# Patient Record
Sex: Male | Born: 1940
Health system: Southern US, Community
[De-identification: ages and names within clinical notes are randomized; demographics above are authoritative.]

## PROBLEM LIST (undated history)

## (undated) DIAGNOSIS — K219 Gastro-esophageal reflux disease without esophagitis: Secondary | ICD-10-CM

## (undated) DIAGNOSIS — J45909 Unspecified asthma, uncomplicated: Secondary | ICD-10-CM

## (undated) DIAGNOSIS — N400 Enlarged prostate without lower urinary tract symptoms: Secondary | ICD-10-CM

## (undated) DIAGNOSIS — R7303 Prediabetes: Secondary | ICD-10-CM

## (undated) DIAGNOSIS — I10 Essential (primary) hypertension: Secondary | ICD-10-CM

## (undated) DIAGNOSIS — E785 Hyperlipidemia, unspecified: Secondary | ICD-10-CM

## (undated) DIAGNOSIS — T7840XA Allergy, unspecified, initial encounter: Secondary | ICD-10-CM

## (undated) DIAGNOSIS — Z85038 Personal history of other malignant neoplasm of large intestine: Secondary | ICD-10-CM

## (undated) DIAGNOSIS — E559 Vitamin D deficiency, unspecified: Secondary | ICD-10-CM

## (undated) HISTORY — DX: Prediabetes: R73.03

## (undated) HISTORY — DX: Allergy, unspecified, initial encounter: T78.40XA

## (undated) HISTORY — DX: Gastro-esophageal reflux disease without esophagitis: K21.9

## (undated) HISTORY — DX: Unspecified asthma, uncomplicated: J45.909

## (undated) HISTORY — DX: Personal history of other malignant neoplasm of large intestine: Z85.038

## (undated) HISTORY — DX: Vitamin D deficiency, unspecified: E55.9

## (undated) HISTORY — DX: Hyperlipidemia, unspecified: E78.5

## (undated) HISTORY — DX: Essential (primary) hypertension: I10

## (undated) HISTORY — DX: Benign prostatic hyperplasia without lower urinary tract symptoms: N40.0

---

## 2000-01-17 ENCOUNTER — Encounter: Payer: Self-pay | Admitting: Internal Medicine

## 2000-01-17 ENCOUNTER — Ambulatory Visit (HOSPITAL_COMMUNITY): Admission: RE | Admit: 2000-01-17 | Discharge: 2000-01-17 | Payer: Self-pay | Admitting: Internal Medicine

## 2000-02-10 ENCOUNTER — Encounter: Payer: Self-pay | Admitting: Internal Medicine

## 2000-02-10 ENCOUNTER — Ambulatory Visit (HOSPITAL_COMMUNITY): Admission: RE | Admit: 2000-02-10 | Discharge: 2000-02-10 | Payer: Self-pay | Admitting: Internal Medicine

## 2001-06-21 ENCOUNTER — Ambulatory Visit (HOSPITAL_COMMUNITY): Admission: RE | Admit: 2001-06-21 | Discharge: 2001-06-21 | Payer: Self-pay | Admitting: Internal Medicine

## 2001-06-21 ENCOUNTER — Encounter: Payer: Self-pay | Admitting: Internal Medicine

## 2002-11-03 HISTORY — PX: COLON SURGERY: SHX602

## 2003-03-13 ENCOUNTER — Encounter: Payer: Self-pay | Admitting: General Surgery

## 2003-03-13 ENCOUNTER — Ambulatory Visit (HOSPITAL_COMMUNITY): Admission: RE | Admit: 2003-03-13 | Discharge: 2003-03-13 | Payer: Self-pay | Admitting: General Surgery

## 2003-03-16 ENCOUNTER — Ambulatory Visit (HOSPITAL_COMMUNITY): Admission: RE | Admit: 2003-03-16 | Discharge: 2003-03-16 | Payer: Self-pay | Admitting: General Surgery

## 2003-03-24 ENCOUNTER — Encounter: Payer: Self-pay | Admitting: General Surgery

## 2003-04-04 ENCOUNTER — Encounter (INDEPENDENT_AMBULATORY_CARE_PROVIDER_SITE_OTHER): Payer: Self-pay

## 2003-04-04 ENCOUNTER — Inpatient Hospital Stay (HOSPITAL_COMMUNITY): Admission: RE | Admit: 2003-04-04 | Discharge: 2003-04-11 | Payer: Self-pay | Admitting: General Surgery

## 2003-05-01 ENCOUNTER — Ambulatory Visit: Admission: RE | Admit: 2003-05-01 | Discharge: 2003-05-17 | Payer: Self-pay | Admitting: Radiation Oncology

## 2003-05-11 ENCOUNTER — Encounter: Payer: Self-pay | Admitting: General Surgery

## 2003-05-11 ENCOUNTER — Ambulatory Visit (HOSPITAL_COMMUNITY): Admission: RE | Admit: 2003-05-11 | Discharge: 2003-05-11 | Payer: Self-pay | Admitting: General Surgery

## 2003-07-04 ENCOUNTER — Ambulatory Visit: Admission: RE | Admit: 2003-07-04 | Discharge: 2003-09-21 | Payer: Self-pay | Admitting: Radiation Oncology

## 2003-10-19 ENCOUNTER — Ambulatory Visit: Admission: RE | Admit: 2003-10-19 | Discharge: 2003-10-19 | Payer: Self-pay | Admitting: Radiation Oncology

## 2003-12-05 ENCOUNTER — Ambulatory Visit (HOSPITAL_COMMUNITY): Admission: RE | Admit: 2003-12-05 | Discharge: 2003-12-05 | Payer: Self-pay | Admitting: General Surgery

## 2003-12-18 ENCOUNTER — Ambulatory Visit (HOSPITAL_COMMUNITY): Admission: RE | Admit: 2003-12-18 | Discharge: 2003-12-18 | Payer: Self-pay | Admitting: General Surgery

## 2004-06-04 ENCOUNTER — Ambulatory Visit (HOSPITAL_COMMUNITY): Admission: RE | Admit: 2004-06-04 | Discharge: 2004-06-04 | Payer: Self-pay | Admitting: Oncology

## 2004-12-09 ENCOUNTER — Ambulatory Visit (HOSPITAL_COMMUNITY): Admission: RE | Admit: 2004-12-09 | Discharge: 2004-12-09 | Payer: Self-pay | Admitting: Oncology

## 2005-03-28 ENCOUNTER — Ambulatory Visit: Payer: Self-pay | Admitting: Oncology

## 2005-06-09 ENCOUNTER — Ambulatory Visit (HOSPITAL_COMMUNITY): Admission: RE | Admit: 2005-06-09 | Discharge: 2005-06-09 | Payer: Self-pay | Admitting: Oncology

## 2005-09-23 ENCOUNTER — Ambulatory Visit: Payer: Self-pay | Admitting: Oncology

## 2006-04-16 ENCOUNTER — Ambulatory Visit: Payer: Self-pay | Admitting: Oncology

## 2006-04-20 LAB — COMPREHENSIVE METABOLIC PANEL
ALT: 44 U/L — ABNORMAL HIGH (ref 0–40)
Alkaline Phosphatase: 106 U/L (ref 39–117)
Creatinine, Ser: 1.27 mg/dL (ref 0.40–1.50)
Sodium: 142 mEq/L (ref 135–145)
Total Bilirubin: 0.5 mg/dL (ref 0.3–1.2)
Total Protein: 7.2 g/dL (ref 6.0–8.3)

## 2006-04-23 ENCOUNTER — Ambulatory Visit (HOSPITAL_COMMUNITY): Admission: RE | Admit: 2006-04-23 | Discharge: 2006-04-23 | Payer: Self-pay | Admitting: Oncology

## 2006-08-24 ENCOUNTER — Ambulatory Visit (HOSPITAL_COMMUNITY): Admission: RE | Admit: 2006-08-24 | Discharge: 2006-08-24 | Payer: Self-pay | Admitting: Internal Medicine

## 2006-10-15 ENCOUNTER — Ambulatory Visit: Payer: Self-pay | Admitting: Oncology

## 2006-10-19 LAB — COMPREHENSIVE METABOLIC PANEL
ALT: 18 U/L (ref 0–53)
Albumin: 4.4 g/dL (ref 3.5–5.2)
CO2: 28 mEq/L (ref 19–32)
Chloride: 107 mEq/L (ref 96–112)
Glucose, Bld: 88 mg/dL (ref 70–99)
Potassium: 4 mEq/L (ref 3.5–5.3)
Sodium: 143 mEq/L (ref 135–145)
Total Protein: 7.3 g/dL (ref 6.0–8.3)

## 2006-10-19 LAB — CEA: CEA: 1.8 ng/mL (ref 0.0–5.0)

## 2006-10-22 ENCOUNTER — Ambulatory Visit (HOSPITAL_COMMUNITY): Admission: RE | Admit: 2006-10-22 | Discharge: 2006-10-22 | Payer: Self-pay | Admitting: Oncology

## 2007-03-18 IMAGING — CT CT CHEST W/ CM
1 of 3 series · 13 of 32 positions shown, 18 images · IV contrast (omnipaque)
Comparison: Examination of 06/09/05.

CLINICAL DATA: Rectal carcinoma.  Followup status post chemotherapy and radiation therapy completion.  No complaints. 
CHEST CT WITH CONTRAST:
TECHNIQUE: Multidetector CT imaging of the chest was performed following the standard protocol during bolus administration of intravenous contrast.
Contrast:  125 cc Omnipaque 300.
TECHNIQUE: Multidetector CT imaging of the abdomen was performed following the standard protocol during bolus administration of intravenous contrast.
TECHNIQUE: Multidetector CT imaging of the pelvis was performed following the standard protocol during bolus administration of intravenous contrast.

[Series 2: cap 5.0 b30f · axial · 0.68mm/px · z∈[-623,-63]mm · 13 of 128 slices shown, 18 images]
[im 8/128  soft-tissue]
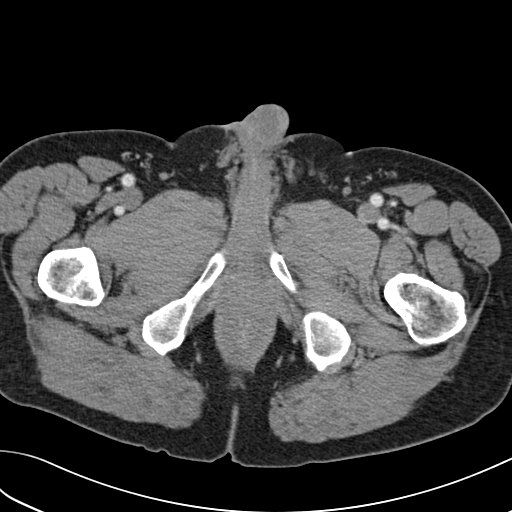
[im 8/128  bone]
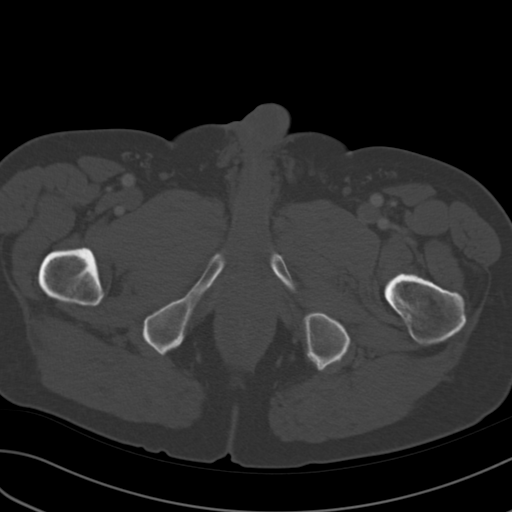
[im 22/128  soft-tissue]
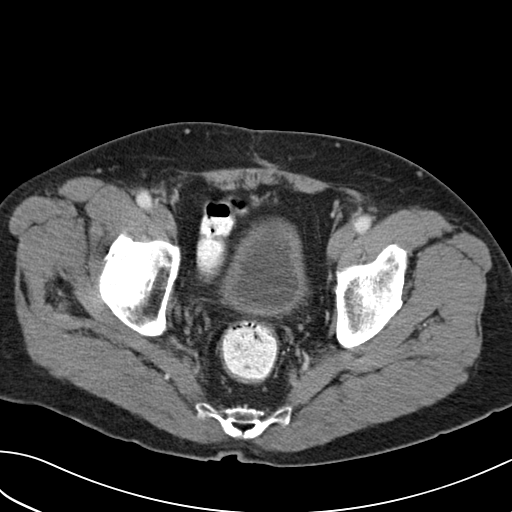
[im 29/128  soft-tissue]
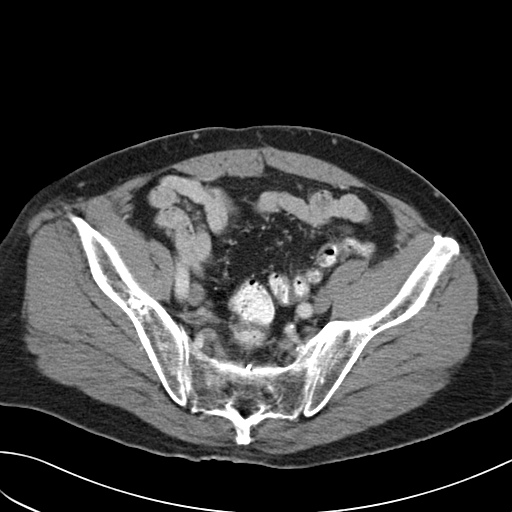
[im 36/128  soft-tissue]
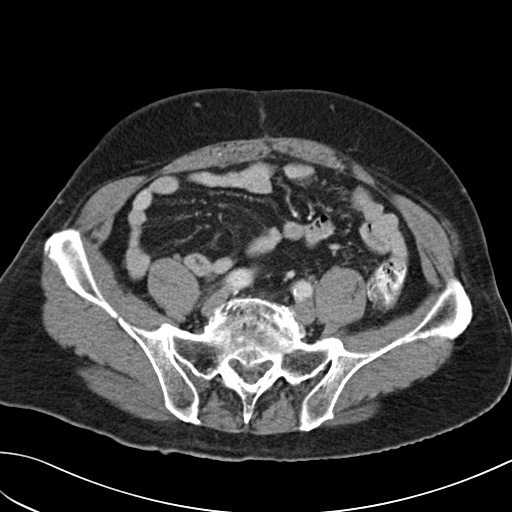
[im 50/128  soft-tissue]
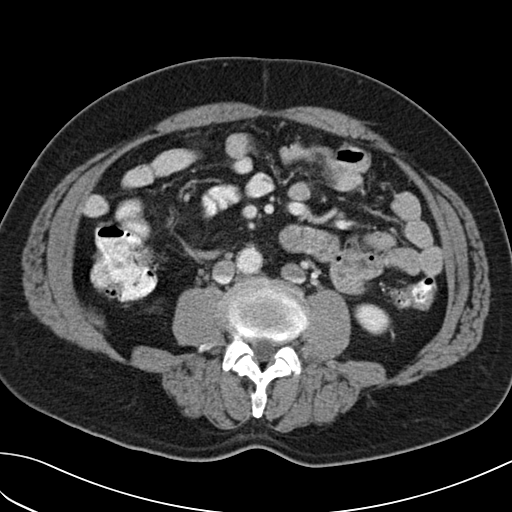
[im 57/128  soft-tissue]
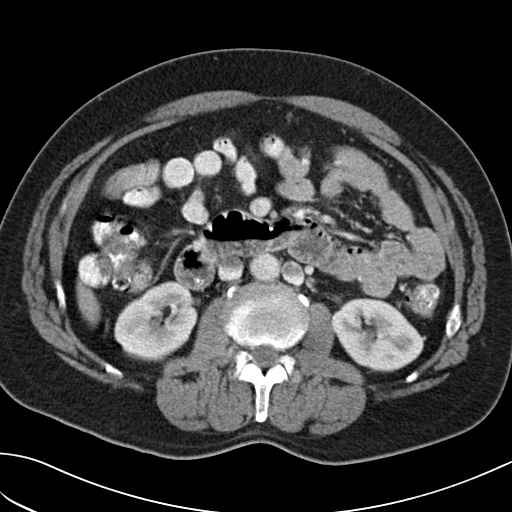
[im 71/128  soft-tissue]
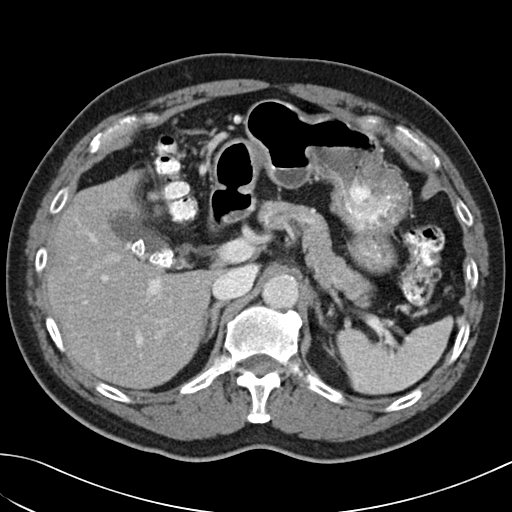
[im 78/128  soft-tissue]
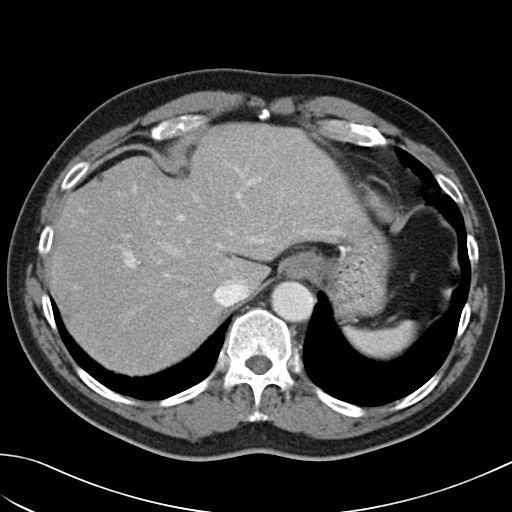
[im 92/128  soft-tissue]
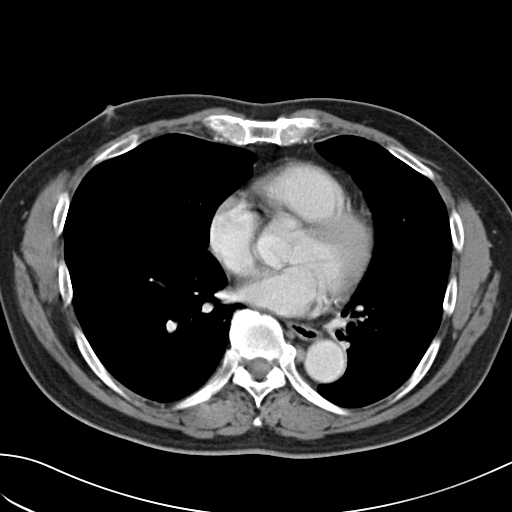
[im 92/128  bone]
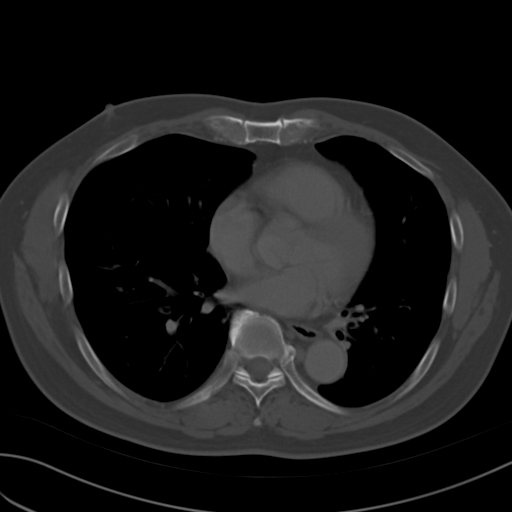
[im 99/128  soft-tissue]
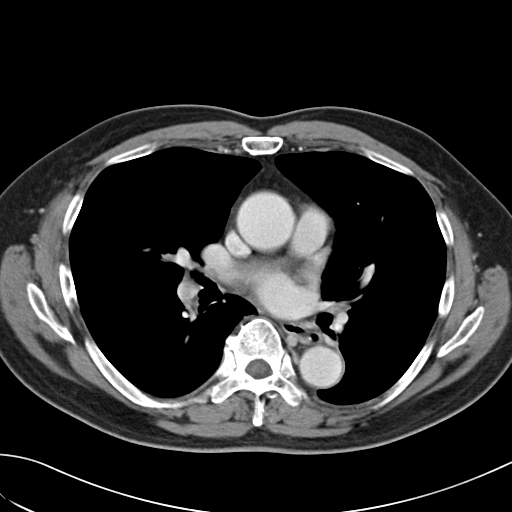
[im 99/128  lung]
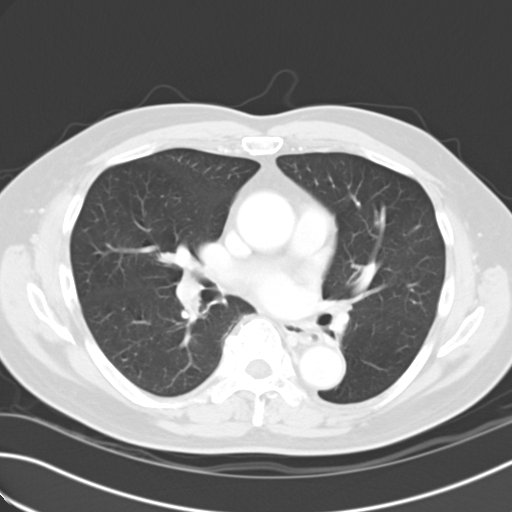
[im 106/128  soft-tissue]
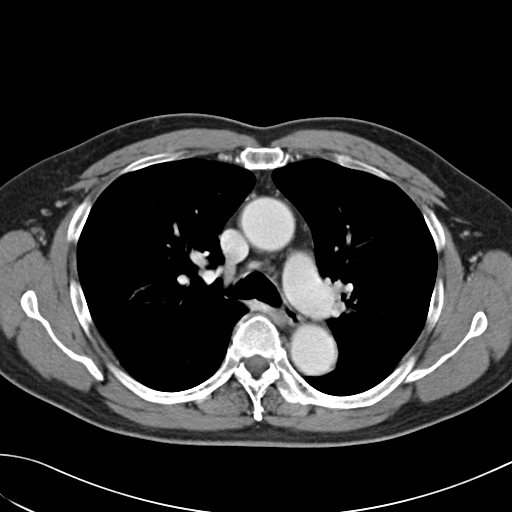
[im 106/128  lung]
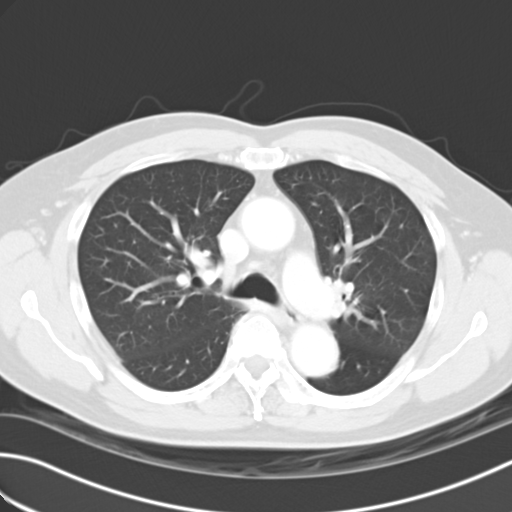
[im 113/128  lung]
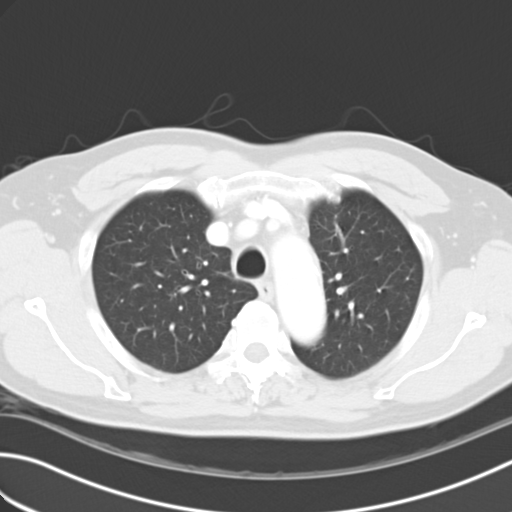
[im 120/128  soft-tissue]
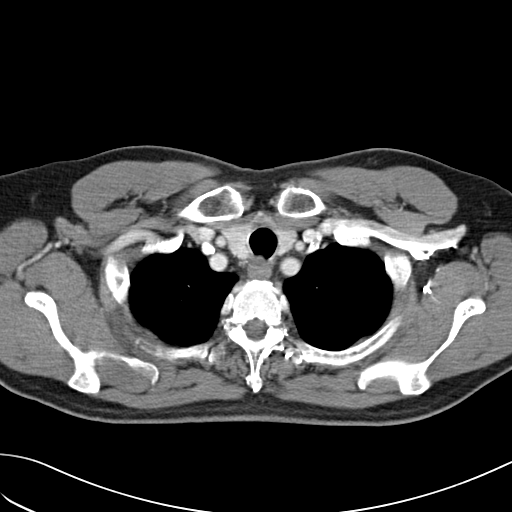
[im 120/128  lung]
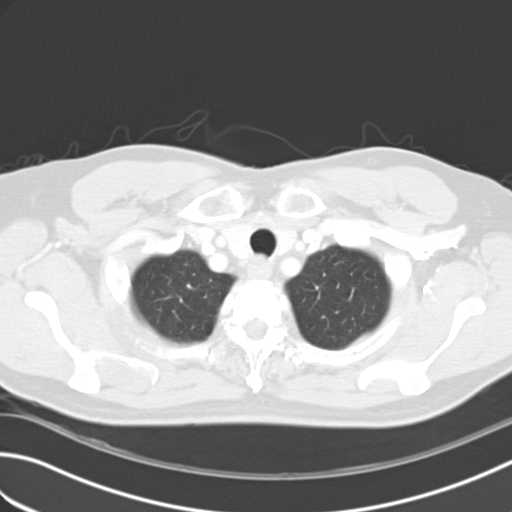

[13 of 32 positions shown; findings below may reference images not displayed]

FINDINGS: Minimal nodularity in the left apex (image 5) and peripheral aspect of the right lower lobe (image 38), unchanged.  Scarring along the medial aspect of the right lower lobe for the most part appears relatively similar with the exception of minimal nodularity appearing slightly more prominent, now measuring 5.3 mm (image 36) and previously measuring 3 mm.  Stability can be confirmed on followup. 
No mediastinal, hilar, or axillary adenopathy.  No pulmonary embolus.  Atherosclerotic-type changes with mild ectasia.  Ascending aorta measures 3.7 x 3.9 cm.  Coronary artery calcifications.  No bony destructive lesion.
IMPRESSION: Minimal nodularity within the medial aspect of the right lower lobe (image 36) appears slightly more prominent than on the prior exam. This may be related to minimal differences in sectioning plane rather than true growth.  Stability can be confirmed on followup.   
ABDOMEN CT WITH CONTRAST:
FINDINGS: Multiple gallstones without CT evidence of inflammation.  In the dome of the liver, there is a 1.1 x 1 cm lesion (image 48), previously measuring 0.8 x 0.9 cm.  Anterior aspect right lobe of the liver peripheral has a 0.5-cm lesion, unchanged (image 51).  In the caudate lobe, there is a 0.5-cm hypodensity (image 54), unchanged.  Subtle hypodensity within the posteroinferior aspect of the right lobe of the liver, measuring 0.5 cm (image 61), unchanged.  0.6-cm low-density structure within the mid aspect of the left kidney (series 4, image 16) appears minimally more prominent than on the prior exam when this measured 0.5 cm.  This is too small to adequately characterize. 
Duplicated inferior vena cava.  Unopacified blood within such limits detection of thrombus.  No surrounding adenopathy.
IMPRESSION: One of the lesions within the liver and the left renal lesion appear minimally more prominent than on the prior exam, which may be explained by very slight differences in sectioning plane rather than true growth.   The remainder of the findings are unchanged. 
PELVIS CT WITH CONTRAST:
FINDINGS: Circumferential thickening of the rectosigmoid region and asymmetric thickening of the bladder wall unchanged any may be related to effect radiation therapy.  Primary bladder wall mucosal abnormality would be difficult to exclude given this appearance. No surrounding adenopathy.  Postsurgical changes.  Degenerative changes of the lumbar spine with spinal stenosis.
IMPRESSION: Postradiation therapy-type changes of the pelvis appear similar to on prior exam.

## 2007-04-19 ENCOUNTER — Ambulatory Visit: Payer: Self-pay | Admitting: Oncology

## 2007-04-22 LAB — CEA: CEA: 2.4 ng/mL (ref 0.0–5.0)

## 2007-07-19 IMAGING — CR DG CHEST 2V
2 series · 2 of 2 positions shown · non-contrast
Comparison: 05/11/03.

CLINICAL DATA: Rectal cancer.  Hypertension.  
 CHEST - 2 VIEW:

[view not recorded (1 of 2)]
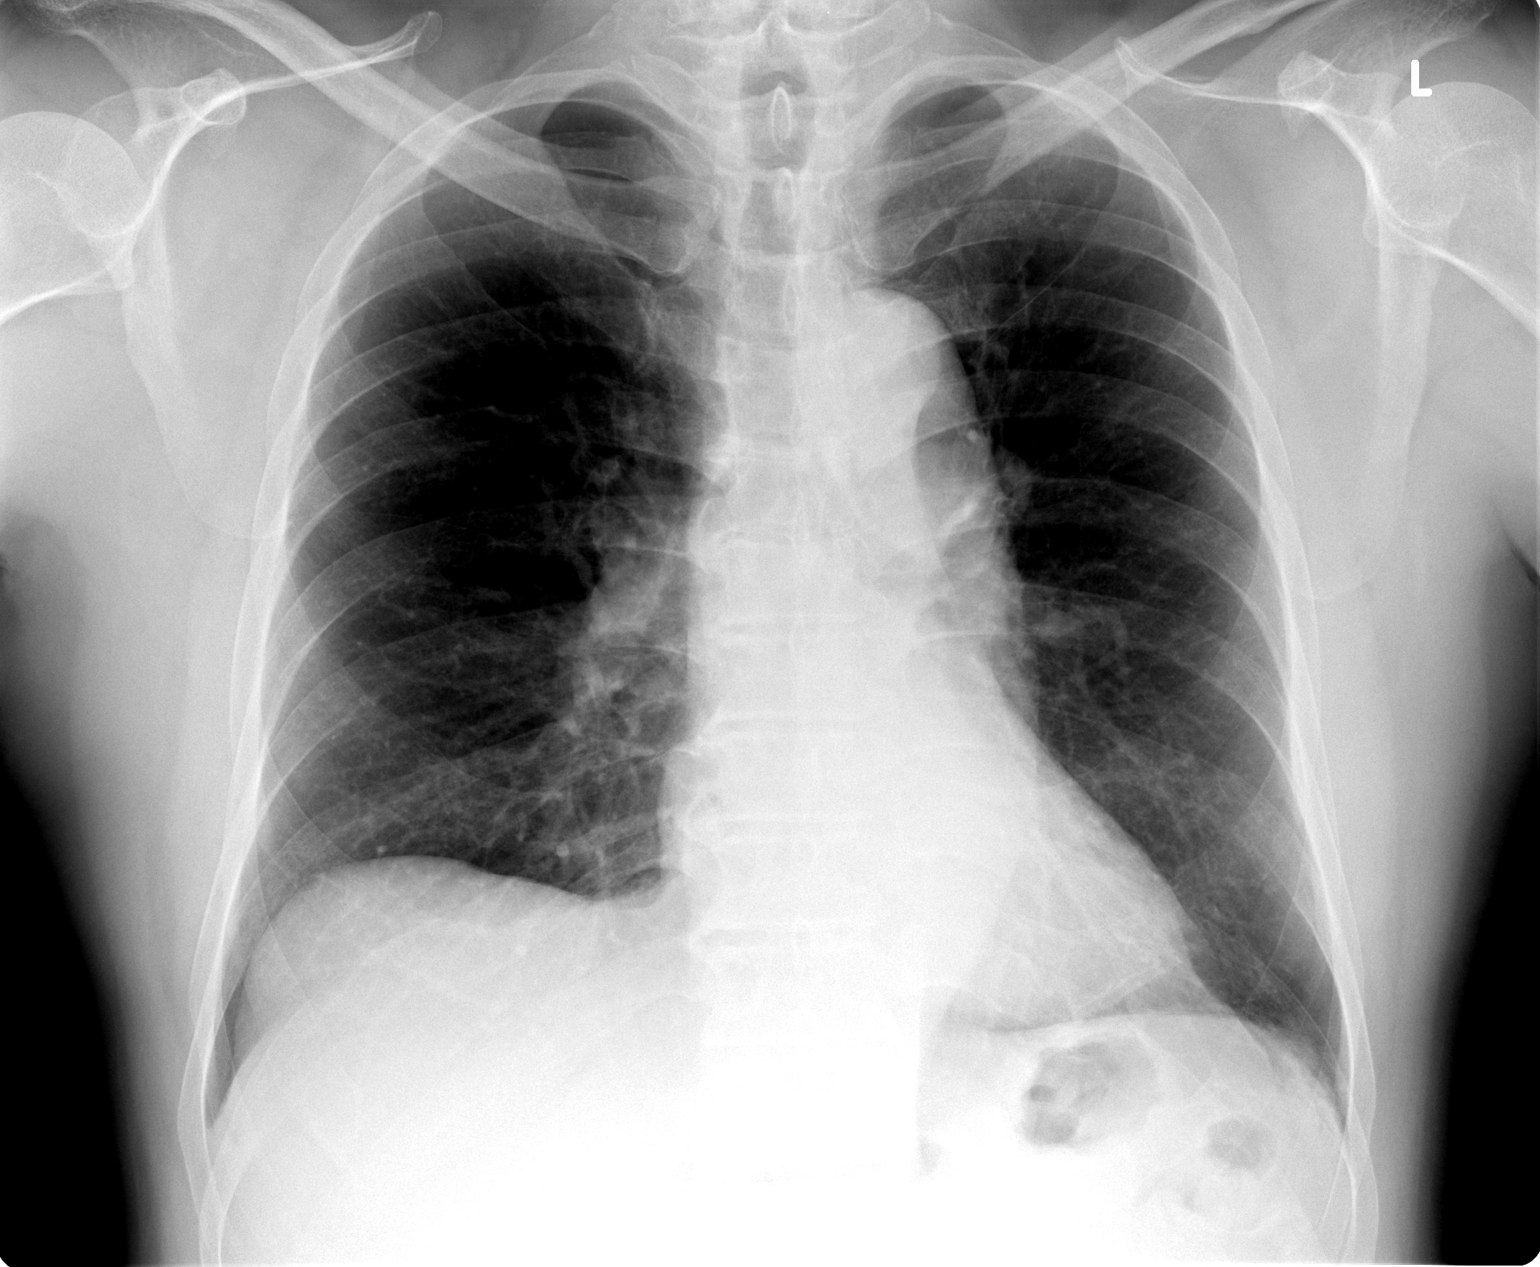

[view not recorded (2 of 2)]
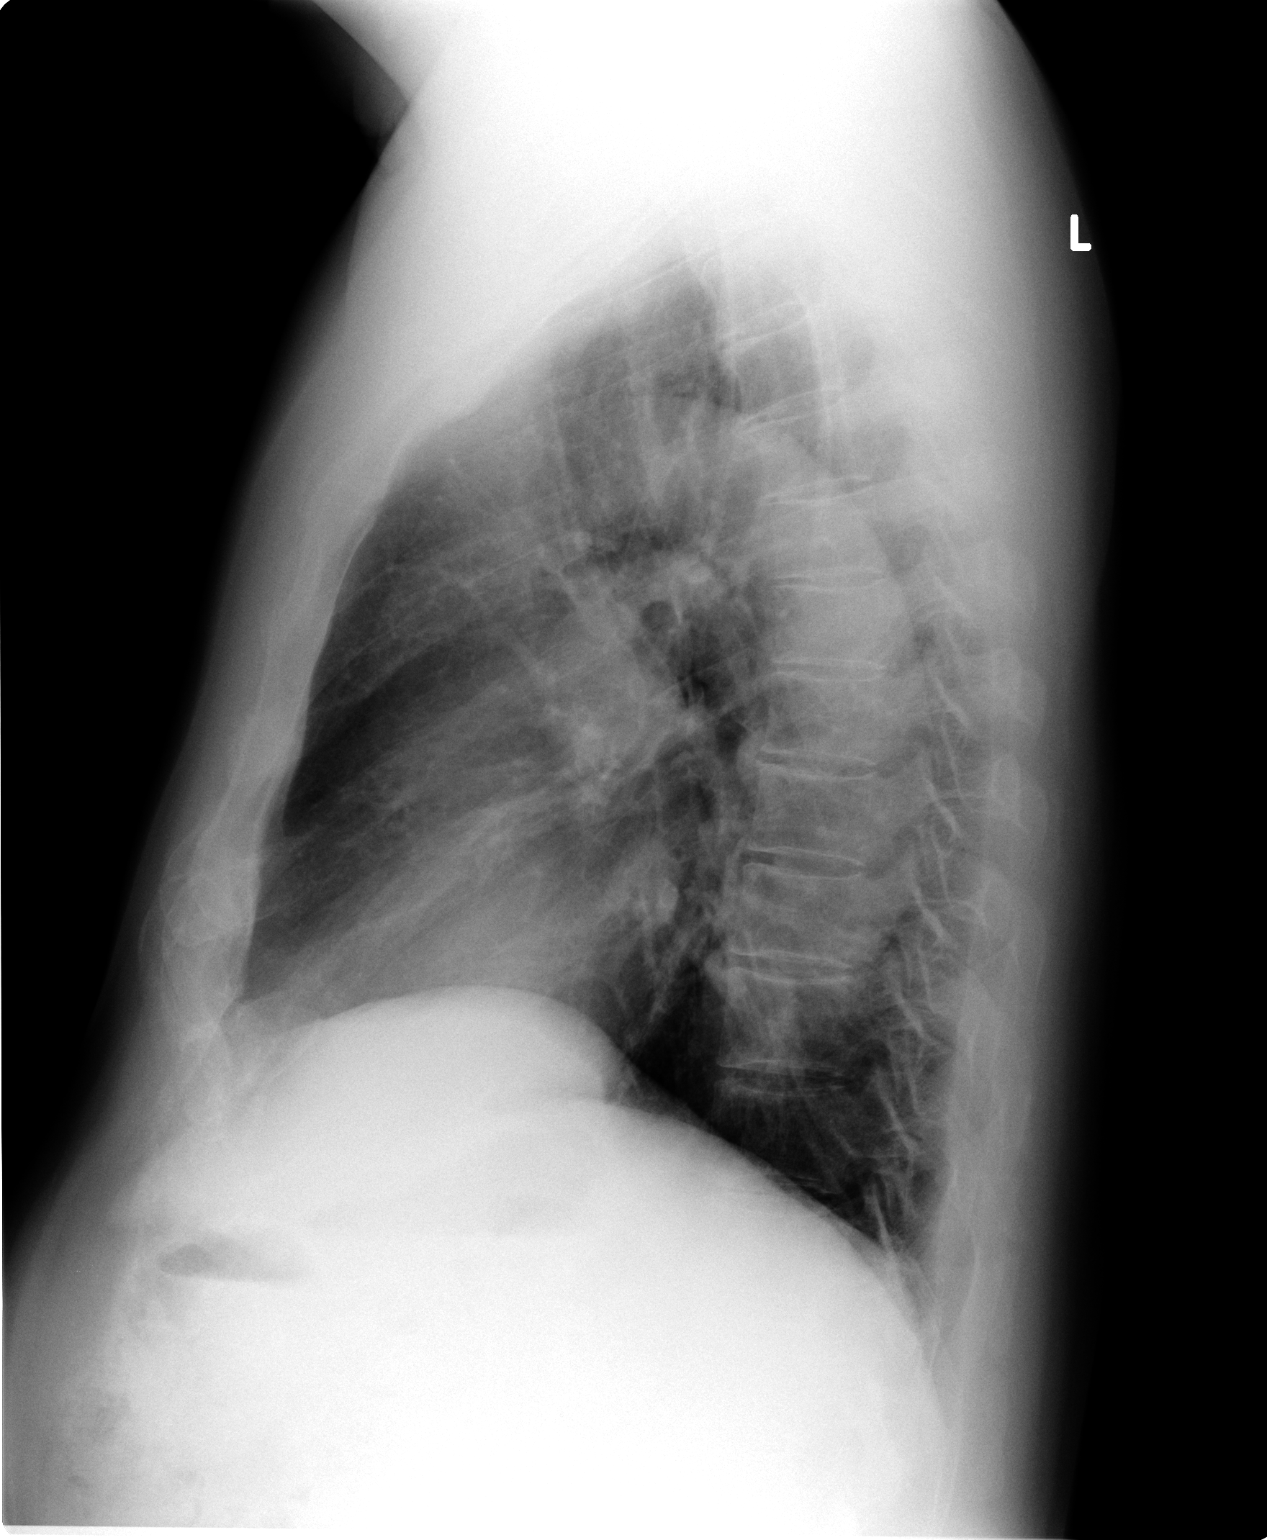

[2 of 2 positions shown; findings below may reference images not displayed]

FINDINGS: The heart size remains within normal limits.  Tortuosity of the thoracic aorta is unchanged.  Both lungs are clear.   There is no evidence of mass or infiltrate.  There is no evidence of pleural effusion.
IMPRESSION: No active cardiopulmonary disease.

## 2007-11-11 ENCOUNTER — Ambulatory Visit: Payer: Self-pay | Admitting: Oncology

## 2007-11-15 LAB — CEA: CEA: 1.8 ng/mL (ref 0.0–5.0)

## 2007-11-22 ENCOUNTER — Ambulatory Visit (HOSPITAL_COMMUNITY): Admission: RE | Admit: 2007-11-22 | Discharge: 2007-11-22 | Payer: Self-pay | Admitting: Internal Medicine

## 2008-05-24 ENCOUNTER — Ambulatory Visit: Payer: Self-pay | Admitting: Oncology

## 2008-05-29 LAB — CEA: CEA: 1.8 ng/mL (ref 0.0–5.0)

## 2010-11-23 ENCOUNTER — Encounter: Payer: Self-pay | Admitting: Oncology

## 2011-03-17 ENCOUNTER — Other Ambulatory Visit: Payer: Self-pay | Admitting: Gastroenterology

## 2011-03-17 LAB — HM COLONOSCOPY

## 2011-03-21 NOTE — Op Note (Signed)
Jon [name], Jon [name] NO.:  [id number]   MEDICAL RECORD NO.:  [id number]                   PATIENT TYPE:  AMB   LOCATION:  DAY                                  FACILITY:  [facility]   PHYSICIAN:  Gita Kudo, M.D.              DATE OF BIRTH:  [birth year]   DATE OF PROCEDURE:  12/18/2003  DATE OF DISCHARGE:                                 OPERATIVE REPORT   PREOPERATIVE DIAGNOSIS:  Port-A-Cath, left chest wall, post chemotherapy.   POSTOPERATIVE DIAGNOSIS:  Port-A-Cath, left chest wall, post chemotherapy.   OPERATIVE PROCEDURE:  Removal of Port-A-Cath venous access device, left  chest wall.   SURGEON:  Gita Kudo, M.D.   ANESTHESIA:  MAC.   OPERATIVE PROCEDURE:  Jon [name] was prepped, positioned, and draped in the  standard fashion.  A total of 25 mL of 1% Xylocaine was used as well as  intravenous sedation.  The local was infiltrated and his previous incision  reopened down to the capsule of the device.  The sutures holding it in place  were cut and the tissue ingrowth was also cut away and the reservoir with  the attached catheter removed intact and without any complication.   Following this the wound was made hemostatic by cautery, lavaged with  saline, and closed in layers with 3-0 Vicryl and Steri-Strips.  A sterile  absorbent dressing applied and the patient to recovery room from operating  room in good condition.                                               Gita Kudo, M.D.    MRL/MEDQ  D:  12/18/2003  T:  12/18/2003  Job:  [id number]

## 2011-03-21 NOTE — Discharge Summary (Signed)
   Jon [name], TITSWORTH NO.:  [id number]   MEDICAL RECORD NO.:  [id number]                   PATIENT TYPE:  INP   LOCATION:  0452                                 FACILITY:  [facility]   PHYSICIAN:  Timothy E. Earlene Plater, M.D.              DATE OF BIRTH:  07-02-1941   DATE OF ADMISSION:  04/04/2003  DATE OF DISCHARGE:  04/11/2003                                 DISCHARGE SUMMARY   FINAL DIAGNOSIS:  Adenocarcinoma of the rectum, T3 N1.   HISTORY OF PRESENT ILLNESS:  The patient was seen in the office, worked up  urgently by myself and Dr. Ritta Slot for obvious carcinoma of the rectum,  and was anxious to proceed with surgery.  He was prepared at home and  brought into the hospital on the day of surgery.   LABORATORY DATA:  His laboratory data preoperatively showed a hemoglobin of  14.5, hematocrit 43.  His other laboratory work had been done as an  outpatient, and is not included.  His postoperative hemoglobin was 13.5 with  hematocrit of 40.  Blood type is O positive.   HOSPITAL COURSE:  He underwent proctectomy with low anterior resection  anastomosis, incidental appendectomy, and proctoscopy on the day of surgery.  He had an excellent postoperative recovery.  He was up and out of bed on the  day of surgery, ambulating on the second day with return of bowel function  over the weekend of April 08, 2003.  He tolerated his diet, his bloating  improved.  His bowel action improved.  He was ready for discharge on April 10, 2003.  The wound was primary, staples were removed.  He was placed on a  careful diet with activity instructions and Vicodin for pain.  He will be  seen for followup in the office in a week or two.  The pathology report was  explained to the patient, as well as an oncology and radiation therapy  consultation will be obtained.                                                Timothy E. Earlene Plater, M.D.    TED/MEDQ  D:  04/20/2003  T:  04/20/2003   Job:  [id number]   cc:   Griffith Citron, M.D.  [facility]  [place] [address]  Fax: [phone]   [name], M.D.  [address]  [place], [place] [address]  Fax: [phone]

## 2012-01-14 DIAGNOSIS — E559 Vitamin D deficiency, unspecified: Secondary | ICD-10-CM | POA: Diagnosis not present

## 2012-01-14 DIAGNOSIS — I10 Essential (primary) hypertension: Secondary | ICD-10-CM | POA: Diagnosis not present

## 2012-01-14 DIAGNOSIS — E782 Mixed hyperlipidemia: Secondary | ICD-10-CM | POA: Diagnosis not present

## 2012-01-14 DIAGNOSIS — Z79899 Other long term (current) drug therapy: Secondary | ICD-10-CM | POA: Diagnosis not present

## 2012-01-14 DIAGNOSIS — R7309 Other abnormal glucose: Secondary | ICD-10-CM | POA: Diagnosis not present

## 2012-01-21 DIAGNOSIS — M999 Biomechanical lesion, unspecified: Secondary | ICD-10-CM | POA: Diagnosis not present

## 2012-01-21 DIAGNOSIS — M5126 Other intervertebral disc displacement, lumbar region: Secondary | ICD-10-CM | POA: Diagnosis not present

## 2012-04-16 DIAGNOSIS — E559 Vitamin D deficiency, unspecified: Secondary | ICD-10-CM | POA: Diagnosis not present

## 2012-04-16 DIAGNOSIS — E782 Mixed hyperlipidemia: Secondary | ICD-10-CM | POA: Diagnosis not present

## 2012-04-16 DIAGNOSIS — I10 Essential (primary) hypertension: Secondary | ICD-10-CM | POA: Diagnosis not present

## 2012-04-16 DIAGNOSIS — Z79899 Other long term (current) drug therapy: Secondary | ICD-10-CM | POA: Diagnosis not present

## 2012-04-16 DIAGNOSIS — R7309 Other abnormal glucose: Secondary | ICD-10-CM | POA: Diagnosis not present

## 2012-05-24 DIAGNOSIS — M999 Biomechanical lesion, unspecified: Secondary | ICD-10-CM | POA: Diagnosis not present

## 2012-05-24 DIAGNOSIS — M5126 Other intervertebral disc displacement, lumbar region: Secondary | ICD-10-CM | POA: Diagnosis not present

## 2012-07-20 DIAGNOSIS — Z23 Encounter for immunization: Secondary | ICD-10-CM | POA: Diagnosis not present

## 2012-07-22 DIAGNOSIS — E559 Vitamin D deficiency, unspecified: Secondary | ICD-10-CM | POA: Diagnosis not present

## 2012-07-22 DIAGNOSIS — Z1212 Encounter for screening for malignant neoplasm of rectum: Secondary | ICD-10-CM | POA: Diagnosis not present

## 2012-07-22 DIAGNOSIS — Z125 Encounter for screening for malignant neoplasm of prostate: Secondary | ICD-10-CM | POA: Diagnosis not present

## 2012-07-22 DIAGNOSIS — R7309 Other abnormal glucose: Secondary | ICD-10-CM | POA: Diagnosis not present

## 2012-07-22 DIAGNOSIS — I10 Essential (primary) hypertension: Secondary | ICD-10-CM | POA: Diagnosis not present

## 2012-07-22 DIAGNOSIS — Z79899 Other long term (current) drug therapy: Secondary | ICD-10-CM | POA: Diagnosis not present

## 2012-07-22 DIAGNOSIS — E782 Mixed hyperlipidemia: Secondary | ICD-10-CM | POA: Diagnosis not present

## 2012-10-21 DIAGNOSIS — E782 Mixed hyperlipidemia: Secondary | ICD-10-CM | POA: Diagnosis not present

## 2012-10-21 DIAGNOSIS — E119 Type 2 diabetes mellitus without complications: Secondary | ICD-10-CM | POA: Diagnosis not present

## 2012-10-21 DIAGNOSIS — E559 Vitamin D deficiency, unspecified: Secondary | ICD-10-CM | POA: Diagnosis not present

## 2012-10-21 DIAGNOSIS — Z79899 Other long term (current) drug therapy: Secondary | ICD-10-CM | POA: Diagnosis not present

## 2012-10-21 DIAGNOSIS — I10 Essential (primary) hypertension: Secondary | ICD-10-CM | POA: Diagnosis not present

## 2012-12-15 DIAGNOSIS — M999 Biomechanical lesion, unspecified: Secondary | ICD-10-CM | POA: Diagnosis not present

## 2012-12-15 DIAGNOSIS — M5126 Other intervertebral disc displacement, lumbar region: Secondary | ICD-10-CM | POA: Diagnosis not present

## 2013-01-20 DIAGNOSIS — I10 Essential (primary) hypertension: Secondary | ICD-10-CM | POA: Diagnosis not present

## 2013-01-20 DIAGNOSIS — R7309 Other abnormal glucose: Secondary | ICD-10-CM | POA: Diagnosis not present

## 2013-01-20 DIAGNOSIS — E559 Vitamin D deficiency, unspecified: Secondary | ICD-10-CM | POA: Diagnosis not present

## 2013-01-20 DIAGNOSIS — E782 Mixed hyperlipidemia: Secondary | ICD-10-CM | POA: Diagnosis not present

## 2013-01-20 DIAGNOSIS — Z79899 Other long term (current) drug therapy: Secondary | ICD-10-CM | POA: Diagnosis not present

## 2013-04-26 DIAGNOSIS — R7309 Other abnormal glucose: Secondary | ICD-10-CM | POA: Diagnosis not present

## 2013-04-26 DIAGNOSIS — Z79899 Other long term (current) drug therapy: Secondary | ICD-10-CM | POA: Diagnosis not present

## 2013-04-26 DIAGNOSIS — I10 Essential (primary) hypertension: Secondary | ICD-10-CM | POA: Diagnosis not present

## 2013-04-26 DIAGNOSIS — E782 Mixed hyperlipidemia: Secondary | ICD-10-CM | POA: Diagnosis not present

## 2013-04-26 DIAGNOSIS — E559 Vitamin D deficiency, unspecified: Secondary | ICD-10-CM | POA: Diagnosis not present

## 2013-05-13 DIAGNOSIS — H04129 Dry eye syndrome of unspecified lacrimal gland: Secondary | ICD-10-CM | POA: Diagnosis not present

## 2013-07-28 DIAGNOSIS — Z1212 Encounter for screening for malignant neoplasm of rectum: Secondary | ICD-10-CM | POA: Diagnosis not present

## 2013-07-28 DIAGNOSIS — Z79899 Other long term (current) drug therapy: Secondary | ICD-10-CM | POA: Diagnosis not present

## 2013-07-28 DIAGNOSIS — E119 Type 2 diabetes mellitus without complications: Secondary | ICD-10-CM | POA: Diagnosis not present

## 2013-07-28 DIAGNOSIS — I10 Essential (primary) hypertension: Secondary | ICD-10-CM | POA: Diagnosis not present

## 2013-07-28 DIAGNOSIS — E782 Mixed hyperlipidemia: Secondary | ICD-10-CM | POA: Diagnosis not present

## 2013-07-28 DIAGNOSIS — E559 Vitamin D deficiency, unspecified: Secondary | ICD-10-CM | POA: Diagnosis not present

## 2013-07-28 DIAGNOSIS — Z23 Encounter for immunization: Secondary | ICD-10-CM | POA: Diagnosis not present

## 2013-07-28 DIAGNOSIS — Z125 Encounter for screening for malignant neoplasm of prostate: Secondary | ICD-10-CM | POA: Diagnosis not present

## 2013-10-19 ENCOUNTER — Other Ambulatory Visit: Payer: Self-pay | Admitting: Internal Medicine

## 2013-11-05 DIAGNOSIS — R7303 Prediabetes: Secondary | ICD-10-CM | POA: Insufficient documentation

## 2013-11-05 DIAGNOSIS — J449 Chronic obstructive pulmonary disease, unspecified: Secondary | ICD-10-CM | POA: Insufficient documentation

## 2013-11-05 DIAGNOSIS — E559 Vitamin D deficiency, unspecified: Secondary | ICD-10-CM | POA: Insufficient documentation

## 2013-11-05 DIAGNOSIS — I10 Essential (primary) hypertension: Secondary | ICD-10-CM | POA: Insufficient documentation

## 2013-11-05 DIAGNOSIS — Z85038 Personal history of other malignant neoplasm of large intestine: Secondary | ICD-10-CM | POA: Insufficient documentation

## 2013-11-05 DIAGNOSIS — E785 Hyperlipidemia, unspecified: Secondary | ICD-10-CM | POA: Insufficient documentation

## 2013-11-05 DIAGNOSIS — J45909 Unspecified asthma, uncomplicated: Secondary | ICD-10-CM | POA: Insufficient documentation

## 2013-11-05 DIAGNOSIS — N400 Enlarged prostate without lower urinary tract symptoms: Secondary | ICD-10-CM | POA: Insufficient documentation

## 2013-11-05 DIAGNOSIS — K219 Gastro-esophageal reflux disease without esophagitis: Secondary | ICD-10-CM | POA: Insufficient documentation

## 2013-11-10 ENCOUNTER — Ambulatory Visit (INDEPENDENT_AMBULATORY_CARE_PROVIDER_SITE_OTHER): Payer: Medicare Other | Admitting: Physician Assistant

## 2013-11-10 ENCOUNTER — Encounter: Payer: Self-pay | Admitting: Physician Assistant

## 2013-11-10 VITALS — BP 122/68 | HR 72 | Temp 97.7°F | Resp 16 | Ht 68.0 in | Wt 172.0 lb

## 2013-11-10 DIAGNOSIS — E785 Hyperlipidemia, unspecified: Secondary | ICD-10-CM

## 2013-11-10 DIAGNOSIS — I10 Essential (primary) hypertension: Secondary | ICD-10-CM | POA: Diagnosis not present

## 2013-11-10 DIAGNOSIS — Z79899 Other long term (current) drug therapy: Secondary | ICD-10-CM

## 2013-11-10 DIAGNOSIS — R7309 Other abnormal glucose: Secondary | ICD-10-CM | POA: Diagnosis not present

## 2013-11-10 DIAGNOSIS — E559 Vitamin D deficiency, unspecified: Secondary | ICD-10-CM

## 2013-11-10 DIAGNOSIS — R7303 Prediabetes: Secondary | ICD-10-CM

## 2013-11-10 LAB — LIPID PANEL
Cholesterol: 178 mg/dL (ref 0–200)
HDL: 57 mg/dL (ref >39)
LDL CALC: 107 mg/dL — AB (ref 0–99)
TRIGLYCERIDES: 70 mg/dL (ref <150)
Total CHOL/HDL Ratio: 3.1 Ratio
VLDL: 14 mg/dL (ref 0–40)

## 2013-11-10 LAB — CBC WITH DIFFERENTIAL/PLATELET
BASOS ABS: 0.1 10*3/uL (ref 0.0–0.1)
BASOS PCT: 2 % — AB (ref 0–1)
Eosinophils Absolute: 0.5 10*3/uL (ref 0.0–0.7)
Eosinophils Relative: 10 % — ABNORMAL HIGH (ref 0–5)
HCT: 44.2 % (ref 39.0–52.0)
Hemoglobin: 14.8 g/dL (ref 13.0–17.0)
Lymphocytes Relative: 26 % (ref 12–46)
Lymphs Abs: 1.4 10*3/uL (ref 0.7–4.0)
MCH: 29.7 pg (ref 26.0–34.0)
MCHC: 33.5 g/dL (ref 30.0–36.0)
MCV: 88.6 fL (ref 78.0–100.0)
Monocytes Absolute: 0.6 10*3/uL (ref 0.1–1.0)
Monocytes Relative: 11 % (ref 3–12)
NEUTROS ABS: 2.8 10*3/uL (ref 1.7–7.7)
Neutrophils Relative %: 51 % (ref 43–77)
Platelets: 228 10*3/uL (ref 150–400)
RBC: 4.99 MIL/uL (ref 4.22–5.81)
RDW: 14.3 % (ref 11.5–15.5)
WBC: 5.4 10*3/uL (ref 4.0–10.5)

## 2013-11-10 LAB — HEPATIC FUNCTION PANEL
ALBUMIN: 4 g/dL (ref 3.5–5.2)
ALT: 23 U/L (ref 0–53)
AST: 22 U/L (ref 0–37)
Alkaline Phosphatase: 102 U/L (ref 39–117)
BILIRUBIN INDIRECT: 0.3 mg/dL (ref 0.0–0.9)
BILIRUBIN TOTAL: 0.4 mg/dL (ref 0.3–1.2)
Bilirubin, Direct: 0.1 mg/dL (ref 0.0–0.3)
Total Protein: 7.2 g/dL (ref 6.0–8.3)

## 2013-11-10 LAB — HEMOGLOBIN A1C
Hgb A1c MFr Bld: 5.8 % — ABNORMAL HIGH (ref <5.7)
Mean Plasma Glucose: 120 mg/dL — ABNORMAL HIGH (ref <117)

## 2013-11-10 LAB — BASIC METABOLIC PANEL WITH GFR
BUN: 18 mg/dL (ref 6–23)
CO2: 29 mEq/L (ref 19–32)
Calcium: 9.3 mg/dL (ref 8.4–10.5)
Chloride: 105 mEq/L (ref 96–112)
Creat: 0.97 mg/dL (ref 0.50–1.35)
GFR, EST NON AFRICAN AMERICAN: 78 mL/min
Glucose, Bld: 92 mg/dL (ref 70–99)
Potassium: 4.3 mEq/L (ref 3.5–5.3)
Sodium: 141 mEq/L (ref 135–145)

## 2013-11-10 LAB — MAGNESIUM: MAGNESIUM: 2.2 mg/dL (ref 1.5–2.5)

## 2013-11-10 NOTE — Progress Notes (Signed)
HPI Patient presents for 3 month follow up with hypertension, hyperlipidemia, prediabetes and vitamin D. Patient's blood pressure has been controlled at home, today their BP is BP: 122/68 mmHg  Patient denies chest pain, shortness of breath, dizziness.  Patient's cholesterol is diet controlled. In addition they are on zetia and lipitor and denies myalgias. The cholesterol last visit was LDL 98 The patient has been working on diet and exercise for prediabetes, and denies changes in vision, polys, and paresthesias. A1C 6.2. He has just started back to running since his knee surgery.  Patient is on Vitamin D supplement.   Current Medications:  Current Outpatient Prescriptions on File Prior to Visit  Medication Sig Dispense Refill  . atorvastatin (LIPITOR) 40 MG tablet Take 40 mg by mouth daily. Taking 10 mg      . ergocalciferol (VITAMIN D2) 50000 UNITS capsule Take 50,000 Units by mouth 2 (two) times a week.      . ezetimibe (ZETIA) 10 MG tablet Take 10 mg by mouth daily.      . Fluticasone-Salmeterol (ADVAIR DISKUS) 250-50 MCG/DOSE AEPB Inhale 1 puff into the lungs 2 (two) times daily.      . Ipratropium-Albuterol (COMBIVENT RESPIMAT) 20-100 MCG/ACT AERS respimat Inhale 1 puff into the lungs every 6 (six) hours as needed for wheezing.      Marland Kitchen losartan (COZAAR) 50 MG tablet TAKE 1 TABLET DAILY FOR BLOOD PRESSURE  90 tablet  3  . verapamil (VERELAN PM) 240 MG 24 hr capsule Take 240 mg by mouth at bedtime.       No current facility-administered medications on file prior to visit.   Medical History:  Past Medical History  Diagnosis Date  . Hyperlipidemia   . Hypertension   . Prediabetes   . Vitamin D deficiency   . Asthma   . GERD (gastroesophageal reflux disease)   . Allergy   . History of colon cancer   . BPH (benign prostatic hyperplasia)    Allergies:  Allergies  Allergen Reactions  . Mavik [Trandolapril]     Cough  . Hctz [Hydrochlorothiazide] Rash    Photosensitivity     ROS Constitutional: Denies fever, chills, headaches, insomnia, fatigue, night sweats Eyes: Denies redness, blurred vision, diplopia, discharge, itchy, watery eyes.  ENT: Denies congestion, post nasal drip, sore throat, earache, dental pain, Tinnitus, Vertigo, Sinus pain, snoring.  Cardio: Denies chest pain, palpitations, irregular heartbeat, dyspnea, diaphoresis, orthopnea, PND, claudication, edema Respiratory: denies cough, shortness of breath, wheezing.  Gastrointestinal: Denies dysphagia, heartburn, AB pain/ cramps, N/V, diarrhea, constipation, hematemesis, melena, hematochezia,  hemorrhoids Genitourinary: Denies dysuria, frequency, urgency, nocturia, hesitancy, discharge, hematuria, flank pain Musculoskeletal: Denies myalgia, stiffness, pain, swelling and strain/sprain. Skin: Denies pruritis, rash, changing in skin lesion Neuro: Denies Weakness, tremor, incoordination, spasms, pain Psychiatric: Denies confusion, memory loss, sensory loss Endocrine: Denies change in weight, skin, hair change, nocturia Diabetic Polys, Denies visual blurring, hyper /hypo glycemic episodes, and paresthesia, Heme/Lymph: Denies Excessive bleeding, bruising, enlarged lymph nodes  Family history- Review and unchanged Social history- Review and unchanged Physical Exam: Filed Vitals:   11/10/13 0859  BP: 122/68  Pulse: 72  Temp: 97.7 F (36.5 C)  Resp: 16   Filed Weights   11/10/13 0859  Weight: 172 lb (78.019 kg)   General Appearance: Well nourished, in no apparent distress. Eyes: PERRLA, EOMs, conjunctiva no swelling or erythema Sinuses: No Frontal/maxillary tenderness ENT/Mouth: Ext aud canals clear, TMs without erythema, bulging. No erythema, swelling, or exudate on post pharynx.  Tonsils not  swollen or erythematous. Hearing normal.  Neck: Supple, thyroid normal.  Respiratory: Respiratory effort normal, BS equal bilaterally without rales, rhonchi, wheezing or stridor.  Cardio: RRR with no MRGs.  Brisk peripheral pulses without edema.  Abdomen: Soft, + BS.  Non tender, no guarding, rebound, hernias, masses. Lymphatics: Non tender without lymphadenopathy.  Musculoskeletal: Full ROM, 5/5 strength, normal gait.  Skin: Warm, dry without rashes, lesions, ecchymosis.  Neuro: Cranial nerves intact. Normal muscle tone, no cerebellar symptoms. Sensation intact.  Psych: Awake and oriented X 3, normal affect, Insight and Judgment appropriate.   Assessment and Plan:  Hypertension: Continue medication, monitor blood pressure at home.  Continue DASH diet. Cholesterol: Continue diet and exercise. Check cholesterol.  Pre-diabetes-Continue diet and exercise. Check A1C Vitamin D Def- continue medications.   Continue diet and meds as discussed. Further disposition pending results of labs.  Vicie Mutters 9:27 AM

## 2013-11-10 NOTE — Patient Instructions (Signed)

## 2013-11-11 ENCOUNTER — Telehealth: Payer: Self-pay

## 2013-11-11 LAB — INSULIN, FASTING: INSULIN FASTING, SERUM: 13 u[IU]/mL (ref 3–28)

## 2013-11-11 LAB — TSH: TSH: 1.374 u[IU]/mL (ref 0.350–4.500)

## 2013-11-11 NOTE — Telephone Encounter (Signed)
lmom to pt to return my call.

## 2013-11-11 NOTE — Telephone Encounter (Signed)
Message copied by Nadyne Coombes on Fri Nov 11, 2013 12:38 PM ------      Message from: Vicie Mutters R      Created: Fri Nov 11, 2013  8:29 AM       All of your labs are normal except:      Your LDL is not in range. Your LDL is the bad cholesterol that can lead to heart attack and stroke. To lower your number you can decrease your fatty foods, red meat, cheese, milk and increase fiber like whole grains and veggies. You can also add a fiber supplement like Metamucil or Benefiber.       Your AIC is in prediabetic range which is between 5.7 and 6.4. This is a warning sign for diabetes. Your A1C is a measure of your sugar over the past 3 months and is not affected by what you have eaten over the past few days. Diabetes increases your chances of stroke and heart attack over 300 % and is the leading cause of blindness and kidney failure in the [place]. Please make sure you decrease bad carbs like white bread, white rice, potatoes, corn, soft drinks, pasta, cereals, refined sugars, sweet tea, dried fruits, and fruit juice. Good carbs are okay to eat in moderation like sweet potatoes, brown rice, whole grain pasta/bread, most fruit (expect dried fruit) and you can eat as many veggies as you want.        ------

## 2013-11-14 ENCOUNTER — Telehealth: Payer: Self-pay

## 2013-11-14 NOTE — Telephone Encounter (Signed)
Patient aware of lab results and instructions

## 2014-01-24 ENCOUNTER — Ambulatory Visit (INDEPENDENT_AMBULATORY_CARE_PROVIDER_SITE_OTHER): Payer: Medicare Other | Admitting: Physician Assistant

## 2014-01-24 ENCOUNTER — Encounter: Payer: Self-pay | Admitting: Physician Assistant

## 2014-01-24 VITALS — BP 138/78 | HR 88 | Temp 99.7°F | Resp 16 | Wt 179.0 lb

## 2014-01-24 DIAGNOSIS — J209 Acute bronchitis, unspecified: Secondary | ICD-10-CM | POA: Diagnosis not present

## 2014-01-24 MED ORDER — PREDNISONE 20 MG PO TABS: ORAL_TABLET | ORAL | Status: DC

## 2014-01-24 MED ORDER — PROMETHAZINE-CODEINE 6.25-10 MG/5ML PO SYRP: 5.0000 mL | ORAL_SOLUTION | Freq: Four times a day (QID) | ORAL | Status: DC | PRN

## 2014-01-24 MED ORDER — AZITHROMYCIN 250 MG PO TABS: ORAL_TABLET | ORAL | Status: DC

## 2014-01-24 NOTE — Patient Instructions (Signed)
Please take the prednisone to help decrease inflammation and therefore decrease symptoms. Take it it with food to avoid GI upset. It can cause increased energy but on the other hand it can make it hard to sleep at night so please take it in the morning.  It is not an antibiotic so you can stop it early if you are feeling better.  If you are diabetic it will increase your sugars.   he majority of colds are caused by viruses and do not require antibiotics. Please read the rest of this hand out to learn more about the common cold and what you can do to help yourself as well as help prevent the over use of antibiotics.   COMMON COLD SIGNS AND SYMPTOMS - The common cold usually causes nasal congestion, runny nose, and sneezing. A sore throat may be present on the first day but usually resolves quickly. If a cough occurs, it generally develops on about the fourth or fifth day of symptoms, typically when congestion and runny nose are resolving  COMMON COLD COMPLICATIONS - In most cases, colds do not cause serious illness or complications. Most colds last for three to seven days, although many people continue to have symptoms (coughing, sneezing, congestion) for up to two weeks.  One of the more common complications is sinusitis, which is usually caused by viruses and rarely (about 2 percent of the time) by bacteria. Having thick or yellow to green-colored nasal discharge does not mean that bacterial sinusitis has developed; discolored nasal discharge is a normal phase of the common cold.  Lower respiratory infections, such as pneumonia or bronchitis, may develop following a cold.  Infection of the middle ear, or otitis media, can accompany or follow a cold.  COMMON COLD TREATMENT - There is no specific treatment for the viruses that cause the common cold. Most treatments are aimed at relieving some of the symptoms of the cold, but do not shorten or cure the cold. Antibiotics are not useful for treating the  common cold; antibiotics are only used to treat illnesses caused by bacteria, not viruses. Unnecessary use of antibiotics for the treatment of the common cold can cause allergic reactions, diarrhea, or other gastrointestinal symptoms in some patients.  The symptoms of a cold will resolve over time, even without any treatment. People with underlying medical conditions and those who use other over-the-counter or prescription medications should speak with their healthcare provider or pharmacist to ensure that it is safe to use these treatments. The following are treatments that may reduce the symptoms caused by the common cold.  Nasal congestion - Decongestants are good for nasal congestion- if you feel very stuffy but no mucus is coming out, this is the medication that will help you the most.  Pseudoephedrine is a decongestant that can improve nasal congestion. Although a prescription is not required, drugstores in the [place] keep pseudoephedrine behind the counter, so it must be requested from a pharmacist. If you have a heart condition or high blood pressure please use Coricidin BPH instead.   Runny nose - Antihistamines such as diphenhydramine (Benadryl), certazine (Zyrtec) which are best taking at night because they can make you tired OR loratadine (Claritin),  fexafinadine (Allegra) help with a runny nose.   Nasal sprays such an oxymetazoline (Afrin and others) may also give temporary relief of nasal congestion. However, these sprays should never be used for more than two to three days; use for more than three days use can worsen congestion.  Nasocort is now over the counter and can help decrease a runny nose. Please stop the medication if you have blurry vision or nose bleeds.   Sore throat and headache - Sore throat and headache are best treated with a mild pain reliever such as acetaminophen (Tylenol) or a non-steroidal anti-inflammatory agent such as ibuprofen or naproxen (Motrin or Aleve).  These medications should be taken with food to prevent stomach problems. As well as gargling with warm water and salt.   Cough - Common cough medicine ingredients include guaifenesin and dextromethorphan; these are often combined with other medications in over-the-counter cold formulas. Often a cough is worse at night or first in the morning due to post nasal drip from you nose. You can try to sleep at an angle to decrease a cough.   Alternative treatments - Heated, humidified air can improve symptoms of nasal congestion and runny nose, and causes few to no side effects. A number of alternative products, including vitamin C, doubling up on your vitamin D and herbal products such as echinacea, may help. Certain products, such as nasal gels that contain zinc (eg, Zicam), have been associated with a permanent loss of smell.  Antibiotics - Antibiotics should not be used to treat an uncomplicated common cold. As noted above, colds are caused by viruses. Antibiotics treat bacterial, not viral infections. Some viruses that cause the common cold can also depress the immune system or cause swelling in the lining of the nose or airways; this can, in turn, lead to a bacterial infection. Often you need to give your body 7 days to fight off a common cold while treating the symptoms with the medications listed above. If after 7 days your symptoms are not improving, you are getting worse, you have shortness of breath, chest pain, a fever of over 103 you should seek medical help immediately.   PREVENTION IS THE BEST MEDICINE - Hand washing is an essential and highly effective way to prevent the spread of infection.  Alcohol-based hand rubs are a good alternative for disinfecting hands if a sink is not available.  Hands should be washed before preparing food and eating and after coughing, blowing the nose, or sneezing. While it is not always possible to limit contact with people who may be infected with a cold, touching  the eyes, nose, or mouth after direct contact should be avoided when possible. Sneezing/coughing into the sleeve of one's clothing (at the inner elbow) is another means of containing sprays of saliva and secretions and does not contaminate the hands.     

## 2014-01-24 NOTE — Progress Notes (Signed)
   Subjective:    Patient ID: Jon [name], male    DOB: [birth year], 73 y.o.   MRN: [id number]  Cough This is a new problem. Episode onset: 4 days. The problem has been unchanged. The problem occurs constantly. The cough is productive of purulent sputum. Associated symptoms include nasal congestion, postnasal drip, rhinorrhea, a sore throat and wheezing. Pertinent negatives include no chest pain, chills, ear congestion, ear pain, fever, headaches, heartburn, hemoptysis, myalgias, rash, shortness of breath, sweats or weight loss. Treatments tried: mucenex, albuterol. The treatment provided mild relief. His past medical history is significant for asthma.      Review of Systems  Constitutional: Negative for fever, chills, weight loss and diaphoresis.  HENT: Positive for congestion, postnasal drip, rhinorrhea, sinus pressure and sore throat. Negative for ear pain, sneezing, trouble swallowing and voice change.   Eyes: Negative.   Respiratory: Positive for cough and wheezing. Negative for hemoptysis, chest tightness and shortness of breath.   Cardiovascular: Negative.  Negative for chest pain.  Gastrointestinal: Negative.  Negative for heartburn.  Genitourinary: Negative.   Musculoskeletal: Negative.  Negative for myalgias and neck pain.  Skin: Negative for rash.  Neurological: Negative.  Negative for headaches.       Objective:   Physical Exam  Constitutional: He is oriented to person, place, and time. He appears well-developed and well-nourished.  HENT:  Head: Normocephalic and atraumatic.  Right Ear: External ear normal.  Left Ear: External ear normal.  Nose: Nose normal.  Mouth/Throat: Oropharynx is clear and moist.  Eyes: Conjunctivae are normal. Pupils are equal, round, and reactive to light.  Neck: Normal range of motion. Neck supple.  Cardiovascular: Normal rate, regular rhythm and normal heart sounds.   No murmur heard. Pulmonary/Chest: Effort normal. No respiratory  distress. He has wheezes. He has no rales. He exhibits no tenderness.  Abdominal: Soft. Bowel sounds are normal.  Lymphadenopathy:    He has no cervical adenopathy.  Neurological: He is alert and oriented to person, place, and time.  Skin: Skin is warm and dry.          Assessment & Plan:  Acute bronchitis - Plan: azithromycin (ZITHROMAX) 250 MG tablet, promethazine-codeine (PHENERGAN WITH CODEINE) 6.25-10 MG/5ML syrup, predniSONE (DELTASONE) 20 MG tablet

## 2014-02-16 ENCOUNTER — Ambulatory Visit (INDEPENDENT_AMBULATORY_CARE_PROVIDER_SITE_OTHER): Payer: Medicare Other | Admitting: Internal Medicine

## 2014-02-16 ENCOUNTER — Encounter: Payer: Self-pay | Admitting: Internal Medicine

## 2014-02-16 VITALS — BP 120/84 | HR 76 | Temp 97.5°F | Resp 16 | Wt 172.4 lb

## 2014-02-16 DIAGNOSIS — Z79899 Other long term (current) drug therapy: Secondary | ICD-10-CM

## 2014-02-16 DIAGNOSIS — R7309 Other abnormal glucose: Secondary | ICD-10-CM | POA: Diagnosis not present

## 2014-02-16 DIAGNOSIS — E785 Hyperlipidemia, unspecified: Secondary | ICD-10-CM

## 2014-02-16 DIAGNOSIS — E559 Vitamin D deficiency, unspecified: Secondary | ICD-10-CM

## 2014-02-16 DIAGNOSIS — I10 Essential (primary) hypertension: Secondary | ICD-10-CM

## 2014-02-16 DIAGNOSIS — R7303 Prediabetes: Secondary | ICD-10-CM

## 2014-02-16 LAB — BASIC METABOLIC PANEL WITH GFR
BUN: 21 mg/dL (ref 6–23)
CHLORIDE: 103 meq/L (ref 96–112)
CO2: 30 mEq/L (ref 19–32)
Calcium: 9.8 mg/dL (ref 8.4–10.5)
Creat: 0.98 mg/dL (ref 0.50–1.35)
GFR, EST AFRICAN AMERICAN: 89 mL/min
GFR, EST NON AFRICAN AMERICAN: 77 mL/min
Glucose, Bld: 90 mg/dL (ref 70–99)
Potassium: 4.6 mEq/L (ref 3.5–5.3)
Sodium: 140 mEq/L (ref 135–145)

## 2014-02-16 LAB — HEPATIC FUNCTION PANEL
ALBUMIN: 4.1 g/dL (ref 3.5–5.2)
ALK PHOS: 94 U/L (ref 39–117)
ALT: 17 U/L (ref 0–53)
AST: 18 U/L (ref 0–37)
Bilirubin, Direct: 0.1 mg/dL (ref 0.0–0.3)
Indirect Bilirubin: 0.4 mg/dL (ref 0.2–1.2)
TOTAL PROTEIN: 7.3 g/dL (ref 6.0–8.3)
Total Bilirubin: 0.5 mg/dL (ref 0.2–1.2)

## 2014-02-16 LAB — CBC WITH DIFFERENTIAL/PLATELET
BASOS ABS: 0.1 10*3/uL (ref 0.0–0.1)
Basophils Relative: 1 % (ref 0–1)
EOS PCT: 8 % — AB (ref 0–5)
Eosinophils Absolute: 0.4 10*3/uL (ref 0.0–0.7)
HEMATOCRIT: 43.3 % (ref 39.0–52.0)
Hemoglobin: 14.8 g/dL (ref 13.0–17.0)
Lymphocytes Relative: 25 % (ref 12–46)
Lymphs Abs: 1.3 10*3/uL (ref 0.7–4.0)
MCH: 29.6 pg (ref 26.0–34.0)
MCHC: 34.2 g/dL (ref 30.0–36.0)
MCV: 86.6 fL (ref 78.0–100.0)
MONO ABS: 0.4 10*3/uL (ref 0.1–1.0)
Monocytes Relative: 8 % (ref 3–12)
Neutro Abs: 2.9 10*3/uL (ref 1.7–7.7)
Neutrophils Relative %: 58 % (ref 43–77)
Platelets: 219 10*3/uL (ref 150–400)
RBC: 5 MIL/uL (ref 4.22–5.81)
RDW: 14.7 % (ref 11.5–15.5)
WBC: 5 10*3/uL (ref 4.0–10.5)

## 2014-02-16 LAB — LIPID PANEL
Cholesterol: 181 mg/dL (ref 0–200)
HDL: 61 mg/dL (ref >39)
LDL CALC: 104 mg/dL — AB (ref 0–99)
TRIGLYCERIDES: 81 mg/dL (ref <150)
Total CHOL/HDL Ratio: 3 Ratio
VLDL: 16 mg/dL (ref 0–40)

## 2014-02-16 LAB — MAGNESIUM: Magnesium: 2.4 mg/dL (ref 1.5–2.5)

## 2014-02-16 LAB — TSH: TSH: 1.238 u[IU]/mL (ref 0.350–4.500)

## 2014-02-16 NOTE — Patient Instructions (Signed)

## 2014-02-16 NOTE — Progress Notes (Signed)
Patient ID: Jon [name], male   DOB: 1941-09-30, 73 y.o.   MRN: [id number]    This very nice 73 y.o. male presents for 3 month follow up with Hypertension, Hyperlipidemia, Pre-Diabetes and Vitamin D Deficiency.    HTN predates since 1996. BP has been controlled at home. Today's BP: 120/84 mmHg . Patient denies any cardiac type chest pain, palpitations, dyspnea/orthopnea/PND, dizziness, claudication, or dependent edema.   Hyperlipidemia is controlled with diet & meds. Last Cholesterol was 178, Triglycerides were  70, HDL 57 and LDL 107 in Jan 2015 - near goal. Patient denies myalgias or other med SE's.    Also, the patient has history of PreDiabetes/insulin resistance and last A1c was 5.8% in Jan 2015. Patient denies any symptoms of reactive hypoglycemia, diabetic polys, paresthesias or visual blurring.   Further, Patient has history of Vitamin D Deficiency of 24 in 2008 with last vitamin D of 84 in Sept 2014. Patient supplements vitamin D without any suspected side-effects.  Medication Sig  . atorvastatin (LIPITOR) 40 MG tablet Take 40 mg by mouth daily. Taking 10 mg  . azithromycin (ZITHROMAX) 250 MG tablet 2 tablets by mouth today then one tablet daily for 4 days.  . ergocalciferol (VITAMIN D2) 50000 UNITS capsule Take 50,000 Units by mouth 2 (two) times a week.  . ezetimibe (ZETIA) 10 MG tablet Take 10 mg by mouth daily.  . Advair 250 Inhale 1 puff into the lungs 2 (two) times daily.  . CombiVent Inhale 1 puff into the lungs every 6 (six) hours as needed for wheezing.  Marland Kitchen losartan (COZAAR) 50 MG tablet TAKE 1 TABLET DAILY FOR BLOOD PRESSURE  . predniSONE (DELTASONE) 20 MG tablet Take one pill two times daily for 3 days, take one pill daily for 4 days.  . (PHENERGAN WITH CODEINE) 6.25-10 MG/5ML  Take 5 mLs by mouth every 6 (six) hours as needed for cough.  . verapamil (VERELAN PM) 240 MG 24 hr capsule Take 240 mg by mouth at bedtime.    Allergies  Allergen Reactions  . Mavik  [Trandolapril]     Cough  . Hctz [Hydrochlorothiazide] Rash    Photosensitivity   PMHx:   Past Medical History  Diagnosis Date  . Hyperlipidemia   . Hypertension   . Prediabetes   . Vitamin D deficiency   . Asthma   . GERD (gastroesophageal reflux disease)   . Allergy   . History of colon cancer   . BPH (benign prostatic hyperplasia)    FHx:    Reviewed / unchanged  SHx:    Reviewed / unchanged   Systems Review: Constitutional: Denies fever, chills, wt changes, headaches, insomnia, fatigue, night sweats, change in appetite. Eyes: Denies redness, blurred vision, diplopia, discharge, itchy, watery eyes.  ENT: Denies discharge, congestion, post nasal drip, epistaxis, sore throat, earache, hearing loss, dental pain, tinnitus, vertigo, sinus pain, snoring.  CV: Denies chest pain, palpitations, irregular heartbeat, syncope, dyspnea, diaphoresis, orthopnea, PND, claudication, edema. Respiratory: denies cough, dyspnea, DOE, pleurisy, hoarseness, laryngitis, wheezing.  Gastrointestinal: Denies dysphagia, odynophagia, heartburn, reflux, water brash, abdominal pain or cramps, nausea, vomiting, bloating, diarrhea, constipation, hematemesis, melena, hematochezia,  or hemorrhoids. Genitourinary: Denies dysuria, frequency, urgency, nocturia, hesitancy, discharge, hematuria, flank pain. Musculoskeletal: Denies arthralgias, myalgias, stiffness, jt. swelling, pain, limp, strain/sprain.  Skin: Denies pruritus, rash, hives, warts, acne, eczema, change in skin lesion(s). Neuro: No weakness, tremor, incoordination, spasms, paresthesia, or pain. Psychiatric: Denies confusion, memory loss, or sensory loss. Endo: Denies change in weight, skin, hair change.  Heme/Lymph: No excessive bleeding, bruising, orenlarged lymph nodes.   Exam:  BP 120/84  Pulse 76  Temp(Src) 97.5 F (36.4 C)  Resp 16  Wt 172 lb 6.4 oz (78.2 kg)  Appears well nourished - in no distress. Eyes: PERRLA, EOMs, conjunctiva no  swelling or erythema. Sinuses: No frontal/maxillary tenderness ENT/Mouth: EAC's clear, TM's nl w/o erythema, bulging. Nares clear w/o erythema, swelling, exudates. Oropharynx clear without erythema or exudates. Oral hygiene is good. Tongue normal, non obstructing. Hearing intact.  Neck: Supple. Thyroid nl. Car 2+/2+ without bruits, nodes or JVD. Chest: Respirations nl with BS clear & equal w/o rales, rhonchi, wheezing or stridor.  Cor: Heart sounds normal w/ regular rate and rhythm without sig. murmurs, gallops, clicks, or rubs. Peripheral pulses normal and equal  without edema.  Abdomen: Soft & bowel sounds normal. Non-tender w/o guarding, rebound, hernias, masses, or organomegaly.  Lymphatics: Unremarkable.  Musculoskeletal: Full ROM all peripheral extremities, joint stability, 5/5 strength, and normal gait.  Skin: Warm, dry without exposed rashes, lesions, ecchymosis apparent.  Neuro: Cranial nerves intact, reflexes equal bilaterally. Sensory-motor testing grossly intact. Tendon reflexes grossly intact.  Pysch: Alert & oriented x 3. Insight and judgement nl & appropriate. No ideations.  Assessment and Plan:  1. Hypertension - Continue monitor blood pressure at home. Continue diet/meds same.  2. Hyperlipidemia - Continue diet/meds, exercise,& lifestyle modifications. Continue monitor periodic cholesterol/liver & renal functions   3. Pre-diabetes/Insulin Resistance - Continue diet, exercise, lifestyle modifications. Monitor appropriate labs.  4. Vitamin D Deficiency - Continue supplementation.  Recommended regular exercise, BP monitoring, weight control, and discussed med and SE's. Recommended labs to assess and monitor clinical status. Further disposition pending results of labs.

## 2014-02-17 LAB — HEMOGLOBIN A1C
Hgb A1c MFr Bld: 6.2 % — ABNORMAL HIGH (ref <5.7)
MEAN PLASMA GLUCOSE: 131 mg/dL — AB (ref <117)

## 2014-02-17 LAB — INSULIN, FASTING: INSULIN FASTING, SERUM: 13 u[IU]/mL (ref 3–28)

## 2014-02-17 LAB — VITAMIN D 25 HYDROXY (VIT D DEFICIENCY, FRACTURES): Vit D, 25-Hydroxy: 82 ng/mL (ref 30–89)

## 2014-04-24 ENCOUNTER — Other Ambulatory Visit: Payer: Self-pay | Admitting: Internal Medicine

## 2014-05-15 DIAGNOSIS — H04129 Dry eye syndrome of unspecified lacrimal gland: Secondary | ICD-10-CM | POA: Diagnosis not present

## 2014-05-15 DIAGNOSIS — H5210 Myopia, unspecified eye: Secondary | ICD-10-CM | POA: Diagnosis not present

## 2014-05-15 DIAGNOSIS — H2510 Age-related nuclear cataract, unspecified eye: Secondary | ICD-10-CM | POA: Diagnosis not present

## 2014-05-22 ENCOUNTER — Ambulatory Visit (INDEPENDENT_AMBULATORY_CARE_PROVIDER_SITE_OTHER): Payer: Medicare Other | Admitting: Physician Assistant

## 2014-05-22 ENCOUNTER — Encounter: Payer: Self-pay | Admitting: Physician Assistant

## 2014-05-22 VITALS — BP 122/78 | HR 72 | Temp 98.1°F | Resp 16 | Wt 171.0 lb

## 2014-05-22 DIAGNOSIS — Z79899 Other long term (current) drug therapy: Secondary | ICD-10-CM

## 2014-05-22 DIAGNOSIS — E785 Hyperlipidemia, unspecified: Secondary | ICD-10-CM | POA: Diagnosis not present

## 2014-05-22 DIAGNOSIS — I10 Essential (primary) hypertension: Secondary | ICD-10-CM | POA: Diagnosis not present

## 2014-05-22 DIAGNOSIS — R7309 Other abnormal glucose: Secondary | ICD-10-CM | POA: Diagnosis not present

## 2014-05-22 DIAGNOSIS — Z1331 Encounter for screening for depression: Secondary | ICD-10-CM | POA: Diagnosis not present

## 2014-05-22 DIAGNOSIS — Z0000 Encounter for general adult medical examination without abnormal findings: Secondary | ICD-10-CM | POA: Diagnosis not present

## 2014-05-22 DIAGNOSIS — E559 Vitamin D deficiency, unspecified: Secondary | ICD-10-CM

## 2014-05-22 DIAGNOSIS — R7303 Prediabetes: Secondary | ICD-10-CM

## 2014-05-22 DIAGNOSIS — Z789 Other specified health status: Secondary | ICD-10-CM

## 2014-05-22 LAB — CBC WITH DIFFERENTIAL/PLATELET
Basophils Absolute: 0.1 10*3/uL (ref 0.0–0.1)
Basophils Relative: 2 % — ABNORMAL HIGH (ref 0–1)
EOS PCT: 10 % — AB (ref 0–5)
Eosinophils Absolute: 0.5 10*3/uL (ref 0.0–0.7)
HCT: 43.2 % (ref 39.0–52.0)
Hemoglobin: 14.6 g/dL (ref 13.0–17.0)
Lymphocytes Relative: 27 % (ref 12–46)
Lymphs Abs: 1.3 10*3/uL (ref 0.7–4.0)
MCH: 29.6 pg (ref 26.0–34.0)
MCHC: 33.8 g/dL (ref 30.0–36.0)
MCV: 87.4 fL (ref 78.0–100.0)
Monocytes Absolute: 0.6 10*3/uL (ref 0.1–1.0)
Monocytes Relative: 13 % — ABNORMAL HIGH (ref 3–12)
NEUTROS PCT: 48 % (ref 43–77)
Neutro Abs: 2.3 10*3/uL (ref 1.7–7.7)
PLATELETS: 218 10*3/uL (ref 150–400)
RBC: 4.94 MIL/uL (ref 4.22–5.81)
RDW: 14.7 % (ref 11.5–15.5)
WBC: 4.8 10*3/uL (ref 4.0–10.5)

## 2014-05-22 LAB — HEMOGLOBIN A1C
HEMOGLOBIN A1C: 6 % — AB (ref <5.7)
Mean Plasma Glucose: 126 mg/dL — ABNORMAL HIGH (ref <117)

## 2014-05-22 MED ORDER — ERGOCALCIFEROL 1.25 MG (50000 UT) PO CAPS: 50000.0000 [IU] | ORAL_CAPSULE | Freq: Every day | ORAL | Status: DC

## 2014-05-22 NOTE — Progress Notes (Signed)
MEDICARE ANNUAL WELLNESS VISIT AND FOLLOW UP Assessment:   1. Essential hypertension - CBC with Differential - BASIC METABOLIC PANEL WITH GFR - Hepatic function panel - TSH  2. Prediabetes Discussed general issues about diabetes pathophysiology and management., Educational material distributed., Suggested low cholesterol diet., Encouraged aerobic exercise., Discussed foot care., Reminded to get yearly retinal exam. - Hemoglobin A1c - Insulin, fasting - HM DIABETES FOOT EXAM  3. Hyperlipidemia - Lipid panel  4. Vitamin D deficiency  5. Encounter for long-term (current) use of other medications - Magnesium  6. Will need Prevnar 13 at CPE  Plan:   During the course of the visit the patient was educated and counseled about appropriate screening and preventive services including:    Pneumococcal vaccine   Influenza vaccine  Td vaccine  Screening electrocardiogram  Colorectal cancer screening  Diabetes screening  Glaucoma screening  Screening recommendations, referrals: Vaccinations: Tdap vaccine not indicated Influenza vaccine not indicated Pneumococcal vaccine discussed getting prevnar 13, would like to wait until CPE Shingles vaccine declined Hep B vaccine not indicated  Nutrition assessed and recommended  Colonoscopy due 2017 Recommended yearly ophthalmology/optometry visit for glaucoma screening and checkup Recommended yearly dental visit for hygiene and checkup Advanced directives - patient has copy, will bring to office  Conditions/risks identified: BMI: Discussed weight loss, diet, and increase physical activity.  Increase physical activity: AHA recommends 150 minutes of physical activity a week.  Medications reviewed Diabetes is at goal, ACE/ARB therapy: Yes. Urinary Incontinence is not an issue: discussed non pharmacology and pharmacology options.  Fall risk: low- discussed PT, home fall assessment, medications.    Subjective:  Jon [name]  is a 73 y.o. male who presents for Medicare Annual Wellness Visit and 3 month follow up for HTN, hyperlipidemia, prediabetes, and vitamin D Def.  Date of last medicare wellness visit was is unknown.  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 He denies chest pain, shortness of breath, dizziness.  He is on cholesterol medication and denies myalgias. His cholesterol is at goal. The cholesterol last visit was:   Lab Results  Component Value Date   CHOL 181 02/16/2014   HDL 61 02/16/2014   LDLCALC 104* 02/16/2014   TRIG 81 02/16/2014   CHOLHDL 3.0 02/16/2014   He has been working on diet and exercise for prediabetes, and denies paresthesia of the feet, polydipsia and polyuria. Last A1C in the office was:  Lab Results  Component Value Date   HGBA1C 6.2* 02/16/2014   Patient is on Vitamin D supplement.   Lab Results  Component Value Date   VD25OH 82 02/16/2014      Names of Other Physician/Practitioners you currently use: 1. [facility] here for primary care 2.  Dr. Jarrett Soho, dentist, next appointment in Sept Patient Care Team: Unk Pinto, MD as PCP - General (Internal Medicine) Linton Rump, MD as Consulting Physician (Ophthalmology)- Saw him last week Mayme Genta, MD as Consulting Physician (Gastroenterology)  Medication Review: Current Outpatient Prescriptions on File Prior to Visit  Medication Sig Dispense Refill  . atorvastatin (LIPITOR) 40 MG tablet Take 40 mg by mouth daily. Taking 10 mg      . ergocalciferol (VITAMIN D2) 50000 UNITS capsule Take 50,000 Units by mouth 2 (two) times a week.      Marland Kitchen  ezetimibe (ZETIA) 10 MG tablet Take 10 mg by mouth daily.      . Fluticasone-Salmeterol (ADVAIR DISKUS) 250-50 MCG/DOSE AEPB Inhale 1 puff into the lungs 2 (two) times daily.       . Ipratropium-Albuterol (COMBIVENT RESPIMAT) 20-100 MCG/ACT AERS respimat Inhale 1 puff into the lungs every 6 (six) hours as needed for wheezing.      Marland Kitchen losartan (COZAAR) 50 MG tablet TAKE 1 TABLET DAILY FOR BLOOD PRESSURE  90 tablet  3  . verapamil (CALAN-SR) 240 MG CR tablet TAKE 1 TABLET BY MOUTH EVERY DAY WITH FOOD  90 tablet  49  . verapamil (VERELAN PM) 240 MG 24 hr capsule Take 240 mg by mouth at bedtime.       No current facility-administered medications on file prior to visit.    Current Problems (verified) Patient Active Problem List   Diagnosis Date Noted  . Encounter for long-term (current) use of other medications 02/16/2014  . Hyperlipidemia   . Hypertension   . Vitamin D deficiency   . Prediabetes   . Asthma   . GERD (gastroesophageal reflux disease)   . Allergy   . History of colon cancer   . BPH (benign prostatic hyperplasia)     Screening Tests Health Maintenance  Topic Date Due  . Tetanus/tdap  04/26/1960  . Zostavax  04/26/2001  . Influenza Vaccine  06/03/2014  . Colonoscopy  03/16/2021  . Pneumococcal Polysaccharide Vaccine Age 30 And Over  Completed    Immunization History  Administered Date(s) Administered  . DT 07/10/2009  . Influenza Split 07/28/2013  . Pneumococcal Polysaccharide-23 02/22/2007   Preventative care: Last colonoscopy: 2012 due 5 years  Prior vaccinations: TD or Tdap: 2010  Influenza: 2014 Pneumococcal: 2008 Shingles/Zostavax: declines  History reviewed: allergies, current medications, past family history, past medical history, past social history, past surgical history and problem list  Risk Factors: Tobacco History  Substance Use Topics  . Smoking status: Never Smoker   . Smokeless tobacco: Never Used  . Alcohol Use: No   He does not smoke.  Patient is not a former smoker. Are there smokers in your home (other than you)?  No  Alcohol Current alcohol use: none  Caffeine Current caffeine use: coffee 1  /day  Exercise Current exercise: running/ jogging  Nutrition/Diet Current diet: in general, a "healthy" diet    Cardiac risk factors: advanced age (older than 26 for men, 33 for women), dyslipidemia, hypertension and male gender.  Depression Screen (Note: if answer to either of the following is "Yes", a more complete depression screening is indicated)   Q1: Over the past two weeks, have you felt down, depressed or hopeless? No  Q2: Over the past two weeks, have you felt little interest or pleasure in doing things? No  Have you lost interest or pleasure in daily life? No  Do you often feel hopeless? No  Do you cry easily over simple problems? No  Activities of Daily Living In your present state of health, do you have any difficulty performing the following activities?:  Driving? No Managing money?  No Feeding yourself? No Getting from bed to chair? No Climbing a flight of stairs? No Preparing food and eating?: No Bathing or showering? No Getting dressed: No Getting to the toilet? No Using the toilet:No Moving around from place to place: No In the past year have you fallen or had a near fall?:No   Are you sexually active?  Yes  Do you have more than  one partner?  No  Vision Difficulties: No  Hearing Difficulties: No Do you often ask people to speak up or repeat themselves? No Do you experience ringing or noises in your ears? No Do you have difficulty understanding soft or whispered voices? No  Cognition  Do you feel that you have a problem with memory?No  Do you often misplace items? No  Do you feel safe at home?  Yes  Advanced directives Does patient have a [place]? Yes Does patient have a Living Will? Yes   Objective:   Blood pressure 122/78, pulse 72, temperature 98.1 F (36.7 C), resp. rate 16, weight 171 lb (77.565 kg). Body mass index is 26.01 kg/(m^2).  General appearance: alert, no distress, WD/WN, male Cognitive  Testing  Alert? Yes  Normal Appearance?Yes  Oriented to person? No  Place? Yes   Time? No  Recall of three objects?  Yes  Can perform simple calculations? Yes  Displays appropriate judgment?Yes  Can read the correct time from a watch face?Yes  HEENT: normocephalic, sclerae anicteric, TMs pearly, nares patent, no discharge or erythema, pharynx normal Oral cavity: MMM, no lesions Neck: supple, no lymphadenopathy, no thyromegaly, no masses Heart: RRR, normal S1, S2, no murmurs Lungs: CTA bilaterally, no wheezes, rhonchi, or rales Abdomen: +bs, soft, non tender, non distended, no masses, no hepatomegaly, no splenomegaly Musculoskeletal: nontender, no swelling, no obvious deformity Extremities: no edema, no cyanosis, no clubbing Pulses: 2+ symmetric, upper and lower extremities, normal cap refill Neurological: alert, oriented x 3, CN2-12 intact, strength normal upper extremities and lower extremities, sensation normal throughout, DTRs 2+ throughout, no cerebellar signs, gait normal Psychiatric: normal affect, behavior normal, pleasant   Medicare Attestation I have personally reviewed: The patient's medical and social history Their use of alcohol, tobacco or illicit drugs Their current medications and supplements The patient's functional ability including ADLs,fall risks, home safety risks, cognitive, and hearing and visual impairment Diet and physical activities Evidence for depression or mood disorders  The patient's weight, height, BMI, and visual acuity have been recorded in the chart.  I have made referrals, counseling, and provided education to the patient based on review of the above and I have provided the patient with a written personalized care plan for preventive services.     Vicie Mutters, PA-C   05/22/2014

## 2014-05-22 NOTE — Patient Instructions (Signed)
Preventative Care for Adults, Male       REGULAR HEALTH EXAMS:  A routine yearly physical is a good way to check in with your primary care provider about your health and preventive screening. It is also an opportunity to share updates about your health and any concerns you have, and receive a thorough all-over exam.   Most health insurance companies pay for at least some preventative services.  Check with your health plan for specific coverages.  WHAT PREVENTATIVE SERVICES DO MEN NEED?  Adult men should have their weight and blood pressure checked regularly.   Men age 35 and older should have their cholesterol levels checked regularly.  Beginning at age 50 and continuing to age 75, men should be screened for colorectal cancer.  Certain people should may need continued testing until age 85.  Other cancer screening may include exams for testicular and prostate cancer.  Updating vaccinations is part of preventative care.  Vaccinations help protect against diseases such as the flu.  Lab tests are generally done as part of preventative care to screen for anemia and blood disorders, to screen for problems with the kidneys and liver, to screen for bladder problems, to check blood sugar, and to check your cholesterol level.  Preventative services generally include counseling about diet, exercise, avoiding tobacco, drugs, excessive alcohol consumption, and sexually transmitted infections.    GENERAL RECOMMENDATIONS FOR GOOD HEALTH:  Healthy diet:  Eat a variety of foods, including fruit, vegetables, animal or vegetable protein, such as meat, fish, chicken, and eggs, or beans, lentils, tofu, and grains, such as rice.  Drink plenty of water daily.  Decrease saturated fat in the diet, avoid lots of red meat, processed foods, sweets, fast foods, and fried foods.  Exercise:  Aerobic exercise helps maintain good heart health. At least 30-40 minutes of moderate-intensity exercise is recommended.  For example, a brisk walk that increases your heart rate and breathing. This should be done on most days of the week.   Find a type of exercise or a variety of exercises that you enjoy so that it becomes a part of your daily life.  Examples are running, walking, swimming, water aerobics, and biking.  For motivation and support, explore group exercise such as aerobic class, spin class, Zumba, Yoga,or  martial arts, etc.    Set exercise goals for yourself, such as a certain weight goal, walk or run in a race such as a 5k walk/run.  Speak to your primary care provider about exercise goals.  Disease prevention:  If you smoke or chew tobacco, find out from your caregiver how to quit. It can literally save your life, no matter how long you have been a tobacco user. If you do not use tobacco, never begin.   Maintain a healthy diet and normal weight. Increased weight leads to problems with blood pressure and diabetes.   The Body Mass Index or BMI is a way of measuring how much of your body is fat. Having a BMI above 27 increases the risk of heart disease, diabetes, hypertension, stroke and other problems related to obesity. Your caregiver can help determine your BMI and based on it develop an exercise and dietary program to help you achieve or maintain this important measurement at a healthful level.  High blood pressure causes heart and blood vessel problems.  Persistent high blood pressure should be treated with medicine if weight loss and exercise do not work.   Fat and cholesterol leaves deposits in your arteries   that can block them. This causes heart disease and vessel disease elsewhere in your body.  If your cholesterol is found to be high, or if you have heart disease or certain other medical conditions, then you may need to have your cholesterol monitored frequently and be treated with medication.   Ask if you should have a stress test if your history suggests this. A stress test is a test done on  a treadmill that looks for heart disease. This test can find disease prior to there being a problem.  Avoid drinking alcohol in excess (more than two drinks per day).  Avoid use of street drugs. Do not share needles with anyone. Ask for professional help if you need assistance or instructions on stopping the use of alcohol, cigarettes, and/or drugs.  Brush your teeth twice a day with fluoride toothpaste, and floss once a day. Good oral hygiene prevents tooth decay and gum disease. The problems can be painful, unattractive, and can cause other health problems. Visit your dentist for a routine oral and dental check up and preventive care every 6-12 months.   Look at your skin regularly.  Use a mirror to look at your back. Notify your caregivers of changes in moles, especially if there are changes in shapes, colors, a size larger than a pencil eraser, an irregular border, or development of new moles.  Safety:  Use seatbelts 100% of the time, whether driving or as a passenger.  Use safety devices such as hearing protection if you work in environments with loud noise or significant background noise.  Use safety glasses when doing any work that could send debris in to the eyes.  Use a helmet if you ride a bike or motorcycle.  Use appropriate safety gear for contact sports.  Talk to your caregiver about gun safety.  Use sunscreen with a SPF (or skin protection factor) of 15 or greater.  Lighter skinned people are at a greater risk of skin cancer. Don't forget to also wear sunglasses in order to protect your eyes from too much damaging sunlight. Damaging sunlight can accelerate cataract formation.   Practice safe sex. Use condoms. Condoms are used for birth control and to help reduce the spread of sexually transmitted infections (or STIs).  Some of the STIs are gonorrhea (the clap), chlamydia, syphilis, trichomonas, herpes, HPV (human papilloma virus) and HIV (human immunodeficiency virus) which causes AIDS.  The herpes, HIV and HPV are viral illnesses that have no cure. These can result in disability, cancer and death.   Keep carbon monoxide and smoke detectors in your home functioning at all times. Change the batteries every 6 months or use a model that plugs into the wall.   Vaccinations:  Stay up to date with your tetanus shots and other required immunizations. You should have a booster for tetanus every 10 years. Be sure to get your flu shot every year, since 5%-20% of the [place] population comes down with the flu. The flu vaccine changes each year, so being vaccinated once is not enough. Get your shot in the fall, before the flu season peaks.   Other vaccines to consider:  Pneumococcal vaccine to protect against certain types of pneumonia.  This is normally recommended for adults age 65 or older.  However, adults younger than 73 years old with certain underlying conditions such as diabetes, heart or lung disease should also receive the vaccine.  Shingles vaccine to protect against Varicella Zoster if you are older than age 60, or younger   than 73 years old with certain underlying illness.  Hepatitis A vaccine to protect against a form of infection of the liver by a virus acquired from food.  Hepatitis B vaccine to protect against a form of infection of the liver by a virus acquired from blood or body fluids, particularly if you work in health care.  If you plan to travel internationally, check with your local health department for specific vaccination recommendations.  Cancer Screening:  Most routine colon cancer screening begins at the age of 50. On a yearly basis, doctors may provide special easy to use take-home tests to check for hidden blood in the stool. Sigmoidoscopy or colonoscopy can detect the earliest forms of colon cancer and is life saving. These tests use a small camera at the end of a tube to directly examine the colon. Speak to your caregiver about this at age 50, when routine  screening begins (and is repeated every 5 years unless early forms of pre-cancerous polyps or small growths are found).   At the age of 50 men usually start screening for prostate cancer every year. Screening may begin at a younger age for those with higher risk. Those at higher risk include African-Americans or having a family history of prostate cancer. There are two types of tests for prostate cancer:   Prostate-specific antigen (PSA) testing. Recent studies raise questions about prostate cancer using PSA and you should discuss this with your caregiver.   Digital rectal exam (in which your doctor's lubricated and gloved finger feels for enlargement of the prostate through the anus).   Screening for testicular cancer.  Do a monthly exam of your testicles. Gently roll each testicle between your thumb and fingers, feeling for any abnormal lumps. The best time to do this is after a hot shower or bath when the tissues are looser. Notify your caregivers of any lumps, tenderness or changes in size or shape immediately.     

## 2014-05-23 LAB — BASIC METABOLIC PANEL WITH GFR
BUN: 20 mg/dL (ref 6–23)
CALCIUM: 9.2 mg/dL (ref 8.4–10.5)
CO2: 29 mEq/L (ref 19–32)
Chloride: 102 mEq/L (ref 96–112)
Creat: 1.06 mg/dL (ref 0.50–1.35)
GFR, EST NON AFRICAN AMERICAN: 69 mL/min
GFR, Est African American: 80 mL/min
Glucose, Bld: 79 mg/dL (ref 70–99)
Potassium: 4.4 mEq/L (ref 3.5–5.3)
SODIUM: 141 meq/L (ref 135–145)

## 2014-05-23 LAB — LIPID PANEL
CHOL/HDL RATIO: 3.5 ratio
CHOLESTEROL: 215 mg/dL — AB (ref 0–200)
HDL: 61 mg/dL (ref >39)
LDL Cholesterol: 132 mg/dL — ABNORMAL HIGH (ref 0–99)
Triglycerides: 109 mg/dL (ref <150)
VLDL: 22 mg/dL (ref 0–40)

## 2014-05-23 LAB — HEPATIC FUNCTION PANEL
ALT: 19 U/L (ref 0–53)
AST: 21 U/L (ref 0–37)
Albumin: 3.9 g/dL (ref 3.5–5.2)
Alkaline Phosphatase: 85 U/L (ref 39–117)
BILIRUBIN DIRECT: 0.1 mg/dL (ref 0.0–0.3)
BILIRUBIN TOTAL: 0.5 mg/dL (ref 0.2–1.2)
Indirect Bilirubin: 0.4 mg/dL (ref 0.2–1.2)
Total Protein: 6.8 g/dL (ref 6.0–8.3)

## 2014-05-23 LAB — MAGNESIUM: MAGNESIUM: 2.2 mg/dL (ref 1.5–2.5)

## 2014-05-23 LAB — TSH: TSH: 1.363 u[IU]/mL (ref 0.350–4.500)

## 2014-05-23 LAB — INSULIN, FASTING: Insulin fasting, serum: 9 u[IU]/mL (ref 3–28)

## 2014-05-25 ENCOUNTER — Other Ambulatory Visit: Payer: Self-pay | Admitting: Internal Medicine

## 2014-08-02 ENCOUNTER — Encounter: Payer: Self-pay | Admitting: Internal Medicine

## 2014-08-18 ENCOUNTER — Other Ambulatory Visit: Payer: Self-pay

## 2014-08-31 ENCOUNTER — Ambulatory Visit: Payer: Self-pay

## 2014-09-06 ENCOUNTER — Other Ambulatory Visit: Payer: Self-pay | Admitting: *Deleted

## 2014-09-06 MED ORDER — LOSARTAN POTASSIUM 50 MG PO TABS: ORAL_TABLET | ORAL | Status: DC

## 2014-09-11 ENCOUNTER — Encounter: Payer: Self-pay | Admitting: Internal Medicine

## 2014-09-11 ENCOUNTER — Ambulatory Visit (INDEPENDENT_AMBULATORY_CARE_PROVIDER_SITE_OTHER): Payer: Medicare Other | Admitting: Internal Medicine

## 2014-09-11 VITALS — BP 118/84 | HR 84 | Temp 99.7°F | Resp 16 | Ht 68.5 in | Wt 173.8 lb

## 2014-09-11 DIAGNOSIS — Z125 Encounter for screening for malignant neoplasm of prostate: Secondary | ICD-10-CM

## 2014-09-11 DIAGNOSIS — N400 Enlarged prostate without lower urinary tract symptoms: Secondary | ICD-10-CM

## 2014-09-11 DIAGNOSIS — E559 Vitamin D deficiency, unspecified: Secondary | ICD-10-CM | POA: Diagnosis not present

## 2014-09-11 DIAGNOSIS — R6889 Other general symptoms and signs: Secondary | ICD-10-CM | POA: Diagnosis not present

## 2014-09-11 DIAGNOSIS — E785 Hyperlipidemia, unspecified: Secondary | ICD-10-CM

## 2014-09-11 DIAGNOSIS — Z79899 Other long term (current) drug therapy: Secondary | ICD-10-CM | POA: Diagnosis not present

## 2014-09-11 DIAGNOSIS — K219 Gastro-esophageal reflux disease without esophagitis: Secondary | ICD-10-CM | POA: Diagnosis not present

## 2014-09-11 DIAGNOSIS — I10 Essential (primary) hypertension: Secondary | ICD-10-CM | POA: Diagnosis not present

## 2014-09-11 DIAGNOSIS — Z1212 Encounter for screening for malignant neoplasm of rectum: Secondary | ICD-10-CM

## 2014-09-11 DIAGNOSIS — Z0001 Encounter for general adult medical examination with abnormal findings: Secondary | ICD-10-CM | POA: Diagnosis not present

## 2014-09-11 DIAGNOSIS — Z1331 Encounter for screening for depression: Secondary | ICD-10-CM

## 2014-09-11 DIAGNOSIS — R7309 Other abnormal glucose: Secondary | ICD-10-CM | POA: Diagnosis not present

## 2014-09-11 DIAGNOSIS — R7303 Prediabetes: Secondary | ICD-10-CM

## 2014-09-11 DIAGNOSIS — Z9181 History of falling: Secondary | ICD-10-CM

## 2014-09-11 LAB — CBC WITH DIFFERENTIAL/PLATELET
BASOS ABS: 0.1 10*3/uL (ref 0.0–0.1)
Basophils Relative: 1 % (ref 0–1)
Eosinophils Absolute: 0.2 10*3/uL (ref 0.0–0.7)
Eosinophils Relative: 4 % (ref 0–5)
HEMATOCRIT: 42.6 % (ref 39.0–52.0)
Hemoglobin: 14.3 g/dL (ref 13.0–17.0)
LYMPHS PCT: 33 % (ref 12–46)
Lymphs Abs: 1.8 10*3/uL (ref 0.7–4.0)
MCH: 29.5 pg (ref 26.0–34.0)
MCHC: 33.6 g/dL (ref 30.0–36.0)
MCV: 88 fL (ref 78.0–100.0)
MONO ABS: 0.9 10*3/uL (ref 0.1–1.0)
MONOS PCT: 17 % — AB (ref 3–12)
Neutro Abs: 2.5 10*3/uL (ref 1.7–7.7)
Neutrophils Relative %: 45 % (ref 43–77)
Platelets: 202 10*3/uL (ref 150–400)
RBC: 4.84 MIL/uL (ref 4.22–5.81)
RDW: 14.6 % (ref 11.5–15.5)
WBC: 5.5 10*3/uL (ref 4.0–10.5)

## 2014-09-11 MED ORDER — FLUTICASONE-SALMETEROL 250-50 MCG/DOSE IN AEPB
1.0000 | INHALATION_SPRAY | Freq: Two times a day (BID) | RESPIRATORY_TRACT | Status: DC
Start: 2014-09-11 — End: 2014-09-11

## 2014-09-11 MED ORDER — IPRATROPIUM-ALBUTEROL 20-100 MCG/ACT IN AERS
1.0000 | INHALATION_SPRAY | RESPIRATORY_TRACT | Status: DC | PRN
Start: 2014-09-11 — End: 2014-09-11

## 2014-09-11 MED ORDER — AZITHROMYCIN 250 MG PO TABS: ORAL_TABLET | ORAL | Status: DC

## 2014-09-11 MED ORDER — PREDNISONE 20 MG PO TABS: ORAL_TABLET | ORAL | Status: DC

## 2014-09-11 MED ORDER — FLUTICASONE-SALMETEROL 250-50 MCG/DOSE IN AEPB: 1.0000 | INHALATION_SPRAY | Freq: Two times a day (BID) | RESPIRATORY_TRACT | Status: DC

## 2014-09-11 MED ORDER — IPRATROPIUM-ALBUTEROL 20-100 MCG/ACT IN AERS: 1.0000 | INHALATION_SPRAY | RESPIRATORY_TRACT | Status: DC | PRN

## 2014-09-11 NOTE — Patient Instructions (Signed)

## 2014-09-11 NOTE — Progress Notes (Signed)
Patient ID: Jon [name], male   DOB: September 13, 1941, 73 y.o.   MRN: [id number]  Annual Screening Comprehensive Examination  This very nice 73 y.o.MWM presents for complete physical.  Patient has been followed for HTN, T2_NIDDM  Prediabetes, Hyperlipidemia, and Vitamin D Deficiency. Also c/o 24 hr hx/o head and chest congestion with purulent nasal secretions and sputum.   HTN predates since 1996. Patient's BP has been controlled at home.Today's BP: 118/84 mmHg. Patient denies any cardiac symptoms as chest pain, palpitations, shortness of breath, dizziness or ankle swelling.   Patient's hyperlipidemia is controlled with diet and medications. Patient denies myalgias or other medication SE's. Last lipids were not at goal - Total Chol  215*; HDL61; LDL  132*; Triglycerides 109 on  05/22/2014.   Patient has  prediabetes predating since 2010 with A1c 6.0% and patient denies reactive hypoglycemic symptoms, visual blurring, diabetic polys or paresthesias. Last A1c was still 6.0% on  05/22/2014.   Finally, patient has history of Vitamin D Deficiency of 24 in 2008 and last vitamin D was 82 on 02/16/2014.  Medication Sig  . atorvastatin (LIPITOR) 40 MG tablet Take 40 mg by mouth daily. Taking 10 mg  . ergocalciferol (VITAMIN D2) 50000 UNITS capsule Take 1 capsule (50,000 Units total) by mouth daily. For severe vitamin D def  . ezetimibe (ZETIA) 10 MG tablet Take 10 mg by mouth daily.  . Fluticasone-Salmeterol (ADVAIR DISKUS) 250-50 MCG/DOSE AEPB Inhale 1 puff into the lungs 2 (two) times daily.  . Ipratropium-Albuterol (COMBIVENT RESPIMAT) 20-100 MCG/ACT AERS respimat Inhale 1 puff into the lungs every 6 (six) hours as needed for wheezing.  Marland Kitchen losartan (COZAAR) 50 MG tablet TAKE 1 TABLET DAILY FOR BLOOD PRESSURE  . verapamil (CALAN-SR) 240 MG CR tablet TAKE 1 TABLET BY MOUTH EVERY DAY WITH FOOD  . verapamil (VERELAN PM) 240 MG 24 hr capsule Take 240 mg by mouth at bedtime.  Marland Kitchen ZYRTEC ALLERGY 10 MG tablet TAKE 1  TABLET DAILY FOR ALLERGIES   Allergies  Allergen Reactions  . Mavik [Trandolapril]     Cough  . Hctz [Hydrochlorothiazide] Rash    Photosensitivity   Past Medical History  Diagnosis Date  . Hyperlipidemia   . Hypertension   . Prediabetes   . Vitamin D deficiency   . Asthma   . GERD (gastroesophageal reflux disease)   . Allergy   . History of colon cancer   . BPH (benign prostatic hyperplasia)    Health Maintenance  Topic Date Due  . ZOSTAVAX  04/26/2001  . INFLUENZA VACCINE  06/03/2014  . TETANUS/TDAP  04/20/2019  . COLONOSCOPY  03/16/2021  . PNEUMOCOCCAL POLYSACCHARIDE VACCINE AGE 20 AND OVER  Completed   Immunization History  Administered Date(s) Administered  . DT 07/10/2009  . Influenza Split 07/28/2013  . Pneumococcal Polysaccharide-23 02/22/2007   Past Surgical History  Procedure Laterality Date  . Colon surgery  2004    Dr Rosana Hoes   Family History  Problem Relation Age of Onset  . Heart disease Mother   . Cancer Father   . Cancer Sister   . Autoimmune disease Brother    History   Social History  . Marital Status: Married    Spouse Name: N/A    Number of Children: 2  . Years of Education: N/A   Occupational History  . Retired Nature conservation officer   Social History Main Topics  . Smoking status: Never Smoker   . Smokeless tobacco: Never Used  . Alcohol Use: No  . Drug Use:  No  . Sexual Activity: Active   ROS Constitutional: Denies fever, chills, weight loss/gain, headaches, insomnia, fatigue, night sweats or change in appetite. Eyes: Denies redness, blurred vision, diplopia, discharge, itchy or watery eyes.  ENT: Denies discharge,  post nasal drip, epistaxis, sore throat, earache, hearing loss, dental pain, Tinnitus, [place] snoring. Does c/o sinus congestion and pain  Cardio: Denies chest pain, palpitations, irregular heartbeat, syncope, dyspnea, diaphoresis, orthopnea, PND, claudication or edema Respiratory: denies dyspnea, DOE, pleurisy, hoarseness or   Laryngitis, but does c/o wheezing and productive cough. Gastrointestinal: Denies dysphagia, heartburn, reflux, water brash, pain, cramps, nausea, vomiting, bloating, diarrhea, constipation, hematemesis, melena, hematochezia, jaundice or hemorrhoids Genitourinary: Denies dysuria, frequency, urgency, nocturia, hesitancy, discharge, hematuria or flank pain Musculoskeletal: Denies arthralgia, myalgia, stiffness, Jt. Swelling, pain, limp or strain/sprain. Denies Falls. Skin: Denies puritis, rash, hives, warts, acne, eczema or change in skin lesion Neuro: No weakness, tremor, incoordination, spasms, paresthesia or pain Psychiatric: Denies confusion, memory loss or sensory loss. Denies Depression. Endocrine: Denies change in weight, skin, hair change, nocturia, and paresthesia, diabetic polys, visual blurring or hyper / hypo glycemic episodes.  Heme/Lymph: No excessive bleeding, bruising or enlarged lymph nodes.  Physical Exam  BP 118/84 mmHg  Pulse 84  Temp(Src) 99.7 F (37.6 C)  Resp 16  Ht 5' 8.5" (1.74 m)  Wt 173 lb 12.8 oz (78.835 kg)  BMI 26.04 kg/m2  General Appearance: Well nourished, in no apparent respiratory distress. Eyes: PERRLA, EOMs, conjunctiva no swelling or erythema, normal fundi and vessels. Sinuses: Slight frontal/maxillary tenderness ENT/Mouth: EACs patent / TMs  nl. Nares clear without erythema, swelling, mucoid exudates. Oral hygiene is good. No erythema, swelling, or exudate. Tongue normal, non-obstructing. Tonsils not swollen or erythematous. Hearing normal.  Neck: Supple, thyroid normal. No bruits, nodes or JVD. Respiratory: Respiratory effort normal.  BS equal with bilateral coarse rales and forced end expiratory wheezes. Cardio: Heart sounds are normal with regular rate and rhythm and no murmurs, rubs or gallops. Peripheral pulses are normal and equal bilaterally without edema. No aortic or femoral bruits. Chest: symmetric with normal excursions and percussion.   Abdomen: Flat, soft, with bowl sounds. Nontender, no guarding, rebound, hernias, masses, or organomegaly.  Lymphatics: Non tender without lymphadenopathy.  Genitourinary: No hernias.Testes nl. DRE - prostate nl for age - smooth & firm w/o nodules. Musculoskeletal: Full ROM all peripheral extremities, joint stability, 5/5 strength, and normal gait. Skin: Warm and dry without rashes, lesions, cyanosis, clubbing or  ecchymosis.  Neuro: Cranial nerves intact, reflexes equal bilaterally. Normal muscle tone, no cerebellar symptoms. Sensation intact.  Pysch: Awake and oriented X 3with normal affect, insight and judgment appropriate.   Assessment and Plan  1. Annual Screening Examination 2. Hypertension  3. Hyperlipidemia 4. Pre Diabetes 5. Vitamin D Deficiency 6. GERD 7. BPH 8. Sinusitis, Tracheitis & Asthmatic Bronchitis 8. Asthma 9. Hx Colon Cancer   Continue prudent diet as discussed, weight control, BP monitoring, regular exercise, and medications as discussed.  Discussed med effects and SE's. Routine screening labs and tests as requested with regular follow-up as recommended.   Rx - Zpak , Prednisone taper and Norco for cough.

## 2014-09-12 LAB — LIPID PANEL
Cholesterol: 187 mg/dL (ref 0–200)
HDL: 53 mg/dL (ref >39)
LDL CALC: 114 mg/dL — AB (ref 0–99)
TRIGLYCERIDES: 100 mg/dL (ref <150)
Total CHOL/HDL Ratio: 3.5 Ratio
VLDL: 20 mg/dL (ref 0–40)

## 2014-09-12 LAB — BASIC METABOLIC PANEL WITH GFR
BUN: 18 mg/dL (ref 6–23)
CHLORIDE: 102 meq/L (ref 96–112)
CO2: 28 mEq/L (ref 19–32)
CREATININE: 1.02 mg/dL (ref 0.50–1.35)
Calcium: 8.5 mg/dL (ref 8.4–10.5)
GFR, Est African American: 84 mL/min
GFR, Est Non African American: 73 mL/min
GLUCOSE: 61 mg/dL — AB (ref 70–99)
Potassium: 4.1 mEq/L (ref 3.5–5.3)
Sodium: 141 mEq/L (ref 135–145)

## 2014-09-12 LAB — URINALYSIS, MICROSCOPIC ONLY
BACTERIA UA: NONE SEEN
CASTS: NONE SEEN
Crystals: NONE SEEN
Squamous Epithelial / LPF: NONE SEEN

## 2014-09-12 LAB — HEPATIC FUNCTION PANEL
ALBUMIN: 3.8 g/dL (ref 3.5–5.2)
ALK PHOS: 100 U/L (ref 39–117)
ALT: 26 U/L (ref 0–53)
AST: 28 U/L (ref 0–37)
Bilirubin, Direct: <0.1 mg/dL (ref 0.0–0.3)
Total Bilirubin: 0.3 mg/dL (ref 0.2–1.2)
Total Protein: 6.6 g/dL (ref 6.0–8.3)

## 2014-09-12 LAB — MICROALBUMIN / CREATININE URINE RATIO
Creatinine, Urine: 127.1 mg/dL
Microalb Creat Ratio: 4.7 mg/g (ref 0.0–30.0)
Microalb, Ur: 0.6 mg/dL (ref <2.0)

## 2014-09-12 LAB — PSA: PSA: 0.35 ng/mL (ref <=4.00)

## 2014-09-12 LAB — MAGNESIUM: Magnesium: 2 mg/dL (ref 1.5–2.5)

## 2014-09-12 LAB — TSH: TSH: 1.188 u[IU]/mL (ref 0.350–4.500)

## 2014-09-12 LAB — INSULIN, FASTING: INSULIN FASTING, SERUM: 2.9 u[IU]/mL (ref 2.0–19.6)

## 2014-09-12 LAB — HEMOGLOBIN A1C
Hgb A1c MFr Bld: 6.1 % — ABNORMAL HIGH (ref <5.7)
Mean Plasma Glucose: 128 mg/dL — ABNORMAL HIGH (ref <117)

## 2014-09-12 LAB — VITAMIN D 25 HYDROXY (VIT D DEFICIENCY, FRACTURES): Vit D, 25-Hydroxy: 80 ng/mL (ref 30–89)

## 2014-09-18 ENCOUNTER — Ambulatory Visit (INDEPENDENT_AMBULATORY_CARE_PROVIDER_SITE_OTHER): Payer: Medicare Other

## 2014-09-18 VITALS — Temp 97.7°F

## 2014-09-18 DIAGNOSIS — Z23 Encounter for immunization: Secondary | ICD-10-CM

## 2014-12-15 ENCOUNTER — Ambulatory Visit: Payer: Self-pay | Admitting: Physician Assistant

## 2015-01-02 ENCOUNTER — Encounter: Payer: Self-pay | Admitting: Internal Medicine

## 2015-01-02 ENCOUNTER — Ambulatory Visit (INDEPENDENT_AMBULATORY_CARE_PROVIDER_SITE_OTHER): Payer: Medicare Other | Admitting: Internal Medicine

## 2015-01-02 VITALS — BP 146/80 | HR 76 | Temp 97.7°F | Resp 16 | Ht 68.25 in | Wt 171.4 lb

## 2015-01-02 DIAGNOSIS — K219 Gastro-esophageal reflux disease without esophagitis: Secondary | ICD-10-CM

## 2015-01-02 DIAGNOSIS — I10 Essential (primary) hypertension: Secondary | ICD-10-CM | POA: Diagnosis not present

## 2015-01-02 DIAGNOSIS — E785 Hyperlipidemia, unspecified: Secondary | ICD-10-CM | POA: Diagnosis not present

## 2015-01-02 DIAGNOSIS — R7309 Other abnormal glucose: Secondary | ICD-10-CM

## 2015-01-02 DIAGNOSIS — E559 Vitamin D deficiency, unspecified: Secondary | ICD-10-CM

## 2015-01-02 DIAGNOSIS — Z79899 Other long term (current) drug therapy: Secondary | ICD-10-CM

## 2015-01-02 DIAGNOSIS — R7303 Prediabetes: Secondary | ICD-10-CM

## 2015-01-02 LAB — CBC WITH DIFFERENTIAL/PLATELET
Basophils Absolute: 0.1 10*3/uL (ref 0.0–0.1)
Basophils Relative: 2 % — ABNORMAL HIGH (ref 0–1)
EOS ABS: 0.5 10*3/uL (ref 0.0–0.7)
EOS PCT: 9 % — AB (ref 0–5)
HCT: 44.5 % (ref 39.0–52.0)
Hemoglobin: 14.7 g/dL (ref 13.0–17.0)
LYMPHS ABS: 1.8 10*3/uL (ref 0.7–4.0)
Lymphocytes Relative: 36 % (ref 12–46)
MCH: 29.5 pg (ref 26.0–34.0)
MCHC: 33 g/dL (ref 30.0–36.0)
MCV: 89.2 fL (ref 78.0–100.0)
MPV: 11.1 fL (ref 8.6–12.4)
Monocytes Absolute: 0.5 10*3/uL (ref 0.1–1.0)
Monocytes Relative: 9 % (ref 3–12)
Neutro Abs: 2.2 10*3/uL (ref 1.7–7.7)
Neutrophils Relative %: 44 % (ref 43–77)
Platelets: 245 10*3/uL (ref 150–400)
RBC: 4.99 MIL/uL (ref 4.22–5.81)
RDW: 14.7 % (ref 11.5–15.5)
WBC: 5.1 10*3/uL (ref 4.0–10.5)

## 2015-01-02 NOTE — Patient Instructions (Signed)

## 2015-01-02 NOTE — Progress Notes (Signed)
Patient ID: Jon [name], male   DOB: 01-03-1941, 74 y.o.   MRN: [id number]   This very nice 74 y.o. M [place] M presents for  follow up with Hypertension, Hyperlipidemia, Pre-Diabetes and Vitamin D Deficiency.    Patient is treated for HTN & BP has been controlled at home. Today's BP was elevated at 146/80 - confirmed. Patient has had no complaints of any cardiac type chest pain, palpitations, dyspnea/orthopnea/PND, dizziness, claudication, or dependent edema.   Hyperlipidemia is controlled with diet & meds. Patient denies myalgias or other med SE's. Last Lipids were not at goal -  Total  Chol 187; HDL 53; LDL  114*; Trig 100 on 09/11/2014.   Also, the patient has history of T2_NIDDM PreDiabetes and has had no symptoms of reactive hypoglycemia, diabetic polys, paresthesias or visual blurring.  Last A1c was 6.1% on 09/11/2014.   Further, the patient also has history of Vitamin D Deficiency of 24 in 2008 and supplements vitamin D without any suspected side-effects. Last vitamin D was 80 on 09/11/2014.  Medication Sig  . atorvastatin (LIPITOR) 40 MG tablet Take 40 mg by mouth daily. Taking 10 mg  . ergocalciferol (VITAMIN D2) 50000 UNITS capsule Take 1 capsule (50,000 Units total) by mouth daily. For severe vitamin D def  . ezetimibe (ZETIA) 10 MG tablet Take 10 mg by mouth daily.  . Fluticasone-Salmeterol (ADVAIR DISKUS) 250-50 MCG/DOSE AEPB Inhale 1 puff into the lungs 2 (two) times daily.  . Ipratropium-Albuterol (COMBIVENT RESPIMAT) 20-100 MCG/ACT AERS respimat Inhale 1 puff into the lungs every 4 (four) hours as needed for wheezing.  Marland Kitchen losartan (COZAAR) 50 MG tablet TAKE 1 TABLET DAILY FOR BLOOD PRESSURE  . verapamil (CALAN-SR) 240 MG CR tablet TAKE 1 TABLET BY MOUTH EVERY DAY WITH FOOD  . ZYRTEC ALLERGY 10 MG tablet TAKE 1 TABLET DAILY FOR ALLERGIES  . verapamil (VERELAN PM) 240 MG 24 hr capsule Take 240 mg by mouth at bedtime.   Allergies  Allergen Reactions  . Mavik [Trandolapril]    Cough  . Hctz [Hydrochlorothiazide] Rash    Photosensitivity   PMHx:   Past Medical History  Diagnosis Date  . Hyperlipidemia   . Hypertension   . Prediabetes   . Vitamin D deficiency   . Asthma   . GERD (gastroesophageal reflux disease)   . Allergy   . History of colon cancer   . BPH (benign prostatic hyperplasia)    Immunization History  Administered Date(s) Administered  . DT 07/10/2009  . Influenza Split 07/28/2013  . Influenza, High Dose Seasonal PF 09/18/2014  . Pneumococcal Polysaccharide-23 02/22/2007   Past Surgical History  Procedure Laterality Date  . Colon surgery  2004    Dr Rosana Hoes   FHx:    Reviewed / unchanged  SHx:    Reviewed / unchanged  Systems Review:  Constitutional: Denies fever, chills, wt changes, headaches, insomnia, fatigue, night sweats, change in appetite. Eyes: Denies redness, blurred vision, diplopia, discharge, itchy, watery eyes.  ENT: Denies discharge, congestion, post nasal drip, epistaxis, sore throat, earache, hearing loss, dental pain, tinnitus, vertigo, sinus pain, snoring.  CV: Denies chest pain, palpitations, irregular heartbeat, syncope, dyspnea, diaphoresis, orthopnea, PND, claudication or edema. Respiratory: denies cough, dyspnea, DOE, pleurisy, hoarseness, laryngitis, wheezing.  Gastrointestinal: Denies dysphagia, odynophagia, heartburn, reflux, water brash, abdominal pain or cramps, nausea, vomiting, bloating, diarrhea, constipation, hematemesis, melena, hematochezia  or hemorrhoids. Genitourinary: Denies dysuria, frequency, urgency, nocturia, hesitancy, discharge, hematuria or flank pain. Musculoskeletal: Denies arthralgias, myalgias, stiffness, jt. swelling, pain,  limping or strain/sprain.  Skin: Denies pruritus, rash, hives, warts, acne, eczema or change in skin lesion(s). Neuro: No weakness, tremor, incoordination, spasms, paresthesia or pain. Psychiatric: Denies confusion, memory loss or sensory loss. Endo: Denies change in  weight, skin or hair change.  Heme/Lymph: No excessive bleeding, bruising or enlarged lymph nodes.  Physical Exam  BP 146/80 mmHg  Pulse 76  Temp(Src) 97.7 F (36.5 C)  Resp 16  Ht 5' 8.25" (1.734 m)  Wt 171 lb 6.4 oz (77.747 kg)  BMI 25.86 kg/m2  Appears well nourished and in no distress. Eyes: PERRLA, EOMs, conjunctiva no swelling or erythema. Sinuses: No frontal/maxillary tenderness ENT/Mouth: EAC's clear, TM's nl w/o erythema, bulging. Nares clear w/o erythema, swelling, exudates. Oropharynx clear without erythema or exudates. Oral hygiene is good. Tongue normal, non obstructing. Hearing intact.  Neck: Supple. Thyroid nl. Car 2+/2+ without bruits, nodes or JVD. Chest: Respirations nl with BS clear & equal w/o rales, rhonchi, wheezing or stridor.  Cor: Heart sounds normal w/ regular rate and rhythm without sig. murmurs, gallops, clicks, or rubs. Peripheral pulses normal and equal  without edema.  Abdomen: Soft & bowel sounds normal. Non-tender w/o guarding, rebound, hernias, masses, or organomegaly.  Lymphatics: Unremarkable.  Musculoskeletal: Full ROM all peripheral extremities, joint stability, 5/5 strength, and normal gait.  Skin: Warm, dry without exposed rashes, lesions or ecchymosis apparent.  Neuro: Cranial nerves intact, reflexes equal bilaterally. Sensory-motor testing grossly intact. Tendon reflexes grossly intact.  Pysch: Alert & oriented x 3.  Insight and judgement nl & appropriate. No ideations.  Assessment and Plan:  1. Essential hypertension  - Call if BP> 140/90  - TSH  2. Hyperlipidemia  - Lipid panel  3. Prediabetes  - Hemoglobin A1c - Insulin, fasting  4. Vitamin D deficiency  - Vit D  25 hydroxy   5. Gastroesophageal reflux disease   6. Medication management  - CBC with Differential/Platelet - BASIC METABOLIC PANEL WITH GFR - Hepatic function panel - Magnesium   Recommended regular exercise, BP monitoring, weight control, and discussed  med and SE's. Recommended labs to assess and monitor clinical status. Further disposition pending results of labs.

## 2015-01-03 LAB — HEPATIC FUNCTION PANEL
ALT: 14 U/L (ref 0–53)
AST: 18 U/L (ref 0–37)
Albumin: 3.9 g/dL (ref 3.5–5.2)
Alkaline Phosphatase: 84 U/L (ref 39–117)
BILIRUBIN DIRECT: 0.1 mg/dL (ref 0.0–0.3)
BILIRUBIN INDIRECT: 0.4 mg/dL (ref 0.2–1.2)
Total Bilirubin: 0.5 mg/dL (ref 0.2–1.2)
Total Protein: 6.7 g/dL (ref 6.0–8.3)

## 2015-01-03 LAB — LIPID PANEL
CHOL/HDL RATIO: 3.8 ratio
Cholesterol: 209 mg/dL — ABNORMAL HIGH (ref 0–200)
HDL: 55 mg/dL (ref >=40)
LDL CALC: 129 mg/dL — AB (ref 0–99)
TRIGLYCERIDES: 123 mg/dL (ref <150)
VLDL: 25 mg/dL (ref 0–40)

## 2015-01-03 LAB — VITAMIN D 25 HYDROXY (VIT D DEFICIENCY, FRACTURES): VIT D 25 HYDROXY: 70 ng/mL (ref 30–100)

## 2015-01-03 LAB — BASIC METABOLIC PANEL WITH GFR
BUN: 13 mg/dL (ref 6–23)
CO2: 27 mEq/L (ref 19–32)
Calcium: 8.9 mg/dL (ref 8.4–10.5)
Chloride: 103 mEq/L (ref 96–112)
Creat: 0.97 mg/dL (ref 0.50–1.35)
GFR, EST AFRICAN AMERICAN: 89 mL/min
GFR, EST NON AFRICAN AMERICAN: 77 mL/min
Glucose, Bld: 79 mg/dL (ref 70–99)
POTASSIUM: 4.2 meq/L (ref 3.5–5.3)
SODIUM: 139 meq/L (ref 135–145)

## 2015-01-03 LAB — TSH: TSH: 2.219 u[IU]/mL (ref 0.350–4.500)

## 2015-01-03 LAB — HEMOGLOBIN A1C
HEMOGLOBIN A1C: 5.9 % — AB (ref <5.7)
MEAN PLASMA GLUCOSE: 123 mg/dL — AB (ref <117)

## 2015-01-03 LAB — MAGNESIUM: MAGNESIUM: 2.1 mg/dL (ref 1.5–2.5)

## 2015-01-03 LAB — INSULIN, FASTING: INSULIN FASTING, SERUM: 5.2 u[IU]/mL (ref 2.0–19.6)

## 2015-03-05 ENCOUNTER — Other Ambulatory Visit: Payer: Self-pay | Admitting: Internal Medicine

## 2015-03-19 ENCOUNTER — Ambulatory Visit: Payer: Self-pay | Admitting: Internal Medicine

## 2015-04-24 ENCOUNTER — Ambulatory Visit (INDEPENDENT_AMBULATORY_CARE_PROVIDER_SITE_OTHER): Payer: Medicare Other | Admitting: Internal Medicine

## 2015-04-24 ENCOUNTER — Encounter: Payer: Self-pay | Admitting: Internal Medicine

## 2015-04-24 ENCOUNTER — Other Ambulatory Visit: Payer: Self-pay | Admitting: *Deleted

## 2015-04-24 VITALS — BP 126/76 | HR 68 | Temp 97.7°F | Resp 16 | Ht 68.25 in | Wt 169.2 lb

## 2015-04-24 DIAGNOSIS — I10 Essential (primary) hypertension: Secondary | ICD-10-CM

## 2015-04-24 DIAGNOSIS — E785 Hyperlipidemia, unspecified: Secondary | ICD-10-CM | POA: Diagnosis not present

## 2015-04-24 DIAGNOSIS — Z79899 Other long term (current) drug therapy: Secondary | ICD-10-CM | POA: Diagnosis not present

## 2015-04-24 DIAGNOSIS — R7309 Other abnormal glucose: Secondary | ICD-10-CM

## 2015-04-24 DIAGNOSIS — E559 Vitamin D deficiency, unspecified: Secondary | ICD-10-CM

## 2015-04-24 DIAGNOSIS — R7303 Prediabetes: Secondary | ICD-10-CM

## 2015-04-24 DIAGNOSIS — K219 Gastro-esophageal reflux disease without esophagitis: Secondary | ICD-10-CM

## 2015-04-24 LAB — LIPID PANEL
CHOL/HDL RATIO: 4.4 ratio
Cholesterol: 230 mg/dL — ABNORMAL HIGH (ref 0–200)
HDL: 52 mg/dL (ref >=40)
LDL CALC: 153 mg/dL — AB (ref 0–99)
Triglycerides: 124 mg/dL (ref <150)
VLDL: 25 mg/dL (ref 0–40)

## 2015-04-24 LAB — HEPATIC FUNCTION PANEL
ALBUMIN: 3.9 g/dL (ref 3.5–5.2)
ALK PHOS: 91 U/L (ref 39–117)
ALT: 17 U/L (ref 0–53)
AST: 18 U/L (ref 0–37)
BILIRUBIN DIRECT: 0.1 mg/dL (ref 0.0–0.3)
Indirect Bilirubin: 0.4 mg/dL (ref 0.2–1.2)
TOTAL PROTEIN: 6.9 g/dL (ref 6.0–8.3)
Total Bilirubin: 0.5 mg/dL (ref 0.2–1.2)

## 2015-04-24 LAB — CBC WITH DIFFERENTIAL/PLATELET
BASOS ABS: 0.1 10*3/uL (ref 0.0–0.1)
Basophils Relative: 2 % — ABNORMAL HIGH (ref 0–1)
EOS PCT: 6 % — AB (ref 0–5)
Eosinophils Absolute: 0.3 10*3/uL (ref 0.0–0.7)
HCT: 43.3 % (ref 39.0–52.0)
HEMOGLOBIN: 14.3 g/dL (ref 13.0–17.0)
LYMPHS ABS: 1.3 10*3/uL (ref 0.7–4.0)
LYMPHS PCT: 31 % (ref 12–46)
MCH: 29.5 pg (ref 26.0–34.0)
MCHC: 33 g/dL (ref 30.0–36.0)
MCV: 89.5 fL (ref 78.0–100.0)
MONOS PCT: 12 % (ref 3–12)
MPV: 10.9 fL (ref 8.6–12.4)
Monocytes Absolute: 0.5 10*3/uL (ref 0.1–1.0)
NEUTROS PCT: 49 % (ref 43–77)
Neutro Abs: 2.1 10*3/uL (ref 1.7–7.7)
PLATELETS: 231 10*3/uL (ref 150–400)
RBC: 4.84 MIL/uL (ref 4.22–5.81)
RDW: 14.7 % (ref 11.5–15.5)
WBC: 4.3 10*3/uL (ref 4.0–10.5)

## 2015-04-24 LAB — TSH: TSH: 1.675 u[IU]/mL (ref 0.350–4.500)

## 2015-04-24 LAB — BASIC METABOLIC PANEL WITH GFR
BUN: 15 mg/dL (ref 6–23)
CO2: 29 mEq/L (ref 19–32)
Calcium: 9.3 mg/dL (ref 8.4–10.5)
Chloride: 105 mEq/L (ref 96–112)
Creat: 0.94 mg/dL (ref 0.50–1.35)
GFR, Est African American: >89 mL/min
GFR, Est Non African American: 80 mL/min
Glucose, Bld: 85 mg/dL (ref 70–99)
Potassium: 4.3 mEq/L (ref 3.5–5.3)
Sodium: 144 mEq/L (ref 135–145)

## 2015-04-24 LAB — MAGNESIUM: Magnesium: 2.2 mg/dL (ref 1.5–2.5)

## 2015-04-24 MED ORDER — ERGOCALCIFEROL 1.25 MG (50000 UT) PO CAPS: 50000.0000 [IU] | ORAL_CAPSULE | Freq: Every day | ORAL | Status: DC

## 2015-04-24 NOTE — Progress Notes (Signed)
Patient ID: Jon [name], male   DOB: August 05, 1941, 74 y.o.   MRN: [id number]   This very nice 74 y.o. MWM presents for 3 month follow up with Hypertension, Hyperlipidemia, Pre-Diabetes and Vitamin D Deficiency. 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    Patient is treated for HTN since 1996 & BP has been controlled at home. Today's BP: 126/76 mmHg. Patient has had no complaints of any cardiac type chest pain, palpitations, dyspnea/orthopnea/PND, dizziness, claudication, or dependent edema.   Hyperlipidemia is not controlled with diet & meds. Patient denies myalgias or other med SE's. Last Lipids were not at goal - Chol 209; HDL 55; LDL  129; Trig 123 on 01/02/2015.   Also, the patient has history of PreDiabetes with A1c 6.3% in Sept 2013 and he's has had no symptoms of reactive hypoglycemia, diabetic polys, paresthesias or visual blurring.  Last A1c was 5.9% on 01/02/2015.   Further, the patient also has history of Vitamin D Deficiency of "24" in 2008 and supplements vitamin D without any suspected side-effects. Last vitamin D was 70 on 01/02/2015.  Medication Sig  . atorvastatin  40 MG tablet Take 40 mg by mouth daily. Taking 10 mg  . cetirizine  10 MG tablet TAKE 1 TABLET DAILY FOR ALLERGIES  . VITAMIN D  50,000 UNITS Take 1 capsule (50,000 Units total) by mouth daily. For severe vitamin D def  . ezetimibe  10 MG tablet Take 10 mg by mouth daily.  Marland Kitchen ADVAIR DISKUS 250-50  Inhale 1 puff into the lungs 2 (two) times daily.  . COMBIVENT RESPIMAT Inhale 1 puff into the lungs every 4 (four) hours as needed for wheezing.  Marland Kitchen losartan  50 MG tablet TAKE 1 TABLET DAILY FOR BLOOD PRESSURE  . verapamil -SR 240 MG CR tablet TAKE 1 TABLET BY MOUTH EVERY DAY WITH FOOD   Allergies  Allergen Reactions  . Mavik [Trandolapril]     Cough  . Hctz [Hydrochlorothiazide] Rash    Photosensitivity   PMHx:   Past Medical History   Diagnosis Date  . Hyperlipidemia   . Hypertension   . Prediabetes   . Vitamin D deficiency   . Asthma   . GERD (gastroesophageal reflux disease)   . Allergy   . History of colon cancer   . BPH (benign prostatic hyperplasia)    Immunization History  Administered Date(s) Administered  . DT 07/10/2009  . Influenza Split 07/28/2013  . Influenza, High Dose Seasonal PF 09/18/2014  . Pneumococcal Polysaccharide-23 02/22/2007   Past Surgical History  Procedure Laterality Date  . Colon surgery  2004    Dr Rosana Hoes   FHx:    Reviewed / unchanged  SHx:    Reviewed / unchanged  Systems Review:  Constitutional: Denies fever, chills, wt changes, headaches, insomnia, fatigue, night sweats, change in appetite. Eyes: Denies redness, blurred vision, diplopia, discharge, itchy, watery eyes.  ENT: Denies discharge, congestion, post nasal drip, epistaxis, sore throat, earache, hearing loss, dental pain, tinnitus, vertigo, sinus pain, snoring.  CV: Denies chest pain, palpitations, irregular heartbeat, syncope, dyspnea, diaphoresis, orthopnea, PND, claudication or edema. Respiratory: denies cough, dyspnea, DOE, pleurisy, hoarseness, laryngitis, wheezing.  Gastrointestinal: Denies dysphagia, odynophagia, heartburn, reflux, water brash, abdominal pain or cramps, nausea, vomiting, bloating, diarrhea, constipation, hematemesis, melena, hematochezia  or hemorrhoids. Genitourinary: Denies dysuria, frequency, urgency, nocturia, hesitancy, discharge, hematuria or flank pain. Musculoskeletal: Denies arthralgias, myalgias, stiffness, jt. swelling, pain,  limping or strain/sprain.  Skin: Denies pruritus, rash, hives, warts, acne, eczema or change in skin lesion(s). Neuro: No weakness, tremor, incoordination, spasms, paresthesia or pain. Psychiatric: Denies confusion, memory loss or sensory loss. Endo: Denies change in weight, skin or hair change.  Heme/Lymph: No excessive bleeding, bruising or enlarged lymph  nodes.  Physical Exam  BP 126/76  Pulse 68  Temp 97.7 F   Resp 16  Ht 5' 8.25"   Wt 169 lb 3.2 oz     BMI 25.53   Appears well nourished and in no distress. Eyes: PERRLA, EOMs, conjunctiva no swelling or erythema. Sinuses: No frontal/maxillary tenderness ENT/Mouth: EAC's clear, TM's nl w/o erythema, bulging. Nares clear w/o erythema, swelling, exudates. Oropharynx clear without erythema or exudates. Oral hygiene is good. Tongue normal, non obstructing. Hearing intact.  Neck: Supple. Thyroid nl. Car 2+/2+ without bruits, nodes or JVD. Chest: Respirations nl with BS clear & equal w/o rales, rhonchi, wheezing or stridor.  Cor: Heart sounds normal w/ regular rate and rhythm without sig. murmurs, gallops, clicks, or rubs. Peripheral pulses normal and equal  without edema.  Abdomen: Soft & bowel sounds normal. Non-tender w/o guarding, rebound, hernias, masses, or organomegaly.  Lymphatics: Unremarkable.  Musculoskeletal: Full ROM all peripheral extremities, joint stability, 5/5 strength, and normal gait.  Skin: Warm, dry without exposed rashes, lesions or ecchymosis apparent.  Neuro: Cranial nerves intact, reflexes equal bilaterally. Sensory-motor testing grossly intact. Tendon reflexes grossly intact.  Pysch: Alert & oriented x 3.  Insight and judgement nl & appropriate. No ideations.  Assessment and Plan:  1. Essential hypertension  - TSH  2. Hyperlipidemia  - Lipid panel  3. Prediabetes  - Hemoglobin A1c - Insulin, random  4. Vitamin D deficiency  - Vit D  25 hydroxy (rtn osteoporosis monitoring)  5. Gastroesophageal reflux disease, esophagitis presence not specified   6. Medication management  - CBC with Differential/Platelet - BASIC METABOLIC PANEL WITH GFR - Hepatic function panel - Magnesium   Recommended regular exercise, BP monitoring, weight control, and discussed med and SE's. Recommended labs to assess and monitor clinical status. Further disposition  pending results of labs. Over 30 minutes of exam, counseling, chart review was performed

## 2015-04-24 NOTE — Patient Instructions (Signed)

## 2015-04-25 LAB — HEMOGLOBIN A1C
Hgb A1c MFr Bld: 6.1 % — ABNORMAL HIGH (ref <5.7)
Mean Plasma Glucose: 128 mg/dL — ABNORMAL HIGH (ref <117)

## 2015-04-25 LAB — INSULIN, RANDOM: INSULIN: 5.5 u[IU]/mL (ref 2.0–19.6)

## 2015-04-25 LAB — VITAMIN D 25 HYDROXY (VIT D DEFICIENCY, FRACTURES): Vit D, 25-Hydroxy: 65 ng/mL (ref 30–100)

## 2015-04-30 ENCOUNTER — Other Ambulatory Visit: Payer: Self-pay

## 2015-05-21 DIAGNOSIS — H524 Presbyopia: Secondary | ICD-10-CM | POA: Diagnosis not present

## 2015-05-21 DIAGNOSIS — H04123 Dry eye syndrome of bilateral lacrimal glands: Secondary | ICD-10-CM | POA: Diagnosis not present

## 2015-05-21 DIAGNOSIS — H2513 Age-related nuclear cataract, bilateral: Secondary | ICD-10-CM | POA: Diagnosis not present

## 2015-05-21 DIAGNOSIS — H52223 Regular astigmatism, bilateral: Secondary | ICD-10-CM | POA: Diagnosis not present

## 2015-07-11 ENCOUNTER — Other Ambulatory Visit: Payer: Self-pay | Admitting: *Deleted

## 2015-07-11 MED ORDER — IPRATROPIUM-ALBUTEROL 20-100 MCG/ACT IN AERS: 1.0000 | INHALATION_SPRAY | RESPIRATORY_TRACT | Status: DC | PRN

## 2015-07-11 MED ORDER — FLUTICASONE-SALMETEROL 250-50 MCG/DOSE IN AEPB: 1.0000 | INHALATION_SPRAY | Freq: Two times a day (BID) | RESPIRATORY_TRACT | Status: DC

## 2015-07-17 ENCOUNTER — Other Ambulatory Visit: Payer: Self-pay | Admitting: Physician Assistant

## 2015-07-25 ENCOUNTER — Other Ambulatory Visit: Payer: Self-pay | Admitting: Internal Medicine

## 2015-07-26 ENCOUNTER — Encounter: Payer: Self-pay | Admitting: Internal Medicine

## 2015-07-26 ENCOUNTER — Ambulatory Visit (INDEPENDENT_AMBULATORY_CARE_PROVIDER_SITE_OTHER): Payer: Medicare Other | Admitting: Internal Medicine

## 2015-07-26 VITALS — BP 128/70 | HR 84 | Temp 97.8°F | Resp 18 | Ht 68.5 in | Wt 168.0 lb

## 2015-07-26 DIAGNOSIS — I10 Essential (primary) hypertension: Secondary | ICD-10-CM | POA: Diagnosis not present

## 2015-07-26 DIAGNOSIS — Z23 Encounter for immunization: Secondary | ICD-10-CM

## 2015-07-26 DIAGNOSIS — R7309 Other abnormal glucose: Secondary | ICD-10-CM | POA: Diagnosis not present

## 2015-07-26 DIAGNOSIS — E785 Hyperlipidemia, unspecified: Secondary | ICD-10-CM | POA: Diagnosis not present

## 2015-07-26 DIAGNOSIS — E559 Vitamin D deficiency, unspecified: Secondary | ICD-10-CM | POA: Diagnosis not present

## 2015-07-26 DIAGNOSIS — R7303 Prediabetes: Secondary | ICD-10-CM

## 2015-07-26 DIAGNOSIS — Z79899 Other long term (current) drug therapy: Secondary | ICD-10-CM | POA: Diagnosis not present

## 2015-07-26 LAB — LIPID PANEL
CHOL/HDL RATIO: 4.2 ratio (ref <=5.0)
CHOLESTEROL: 220 mg/dL — AB (ref 125–200)
HDL: 53 mg/dL (ref >=40)
LDL Cholesterol: 147 mg/dL — ABNORMAL HIGH (ref <130)
Triglycerides: 102 mg/dL (ref <150)
VLDL: 20 mg/dL (ref <30)

## 2015-07-26 LAB — CBC WITH DIFFERENTIAL/PLATELET
Basophils Absolute: 0.1 10*3/uL (ref 0.0–0.1)
Basophils Relative: 2 % — ABNORMAL HIGH (ref 0–1)
Eosinophils Absolute: 0.5 10*3/uL (ref 0.0–0.7)
Eosinophils Relative: 8 % — ABNORMAL HIGH (ref 0–5)
HEMATOCRIT: 44.8 % (ref 39.0–52.0)
HEMOGLOBIN: 15 g/dL (ref 13.0–17.0)
LYMPHS ABS: 1.9 10*3/uL (ref 0.7–4.0)
LYMPHS PCT: 31 % (ref 12–46)
MCH: 30.1 pg (ref 26.0–34.0)
MCHC: 33.5 g/dL (ref 30.0–36.0)
MCV: 89.8 fL (ref 78.0–100.0)
MONOS PCT: 10 % (ref 3–12)
MPV: 10.3 fL (ref 8.6–12.4)
Monocytes Absolute: 0.6 10*3/uL (ref 0.1–1.0)
NEUTROS ABS: 3 10*3/uL (ref 1.7–7.7)
Neutrophils Relative %: 49 % (ref 43–77)
Platelets: 252 10*3/uL (ref 150–400)
RBC: 4.99 MIL/uL (ref 4.22–5.81)
RDW: 14.6 % (ref 11.5–15.5)
WBC: 6.1 10*3/uL (ref 4.0–10.5)

## 2015-07-26 LAB — TSH: TSH: 1.125 u[IU]/mL (ref 0.350–4.500)

## 2015-07-26 LAB — BASIC METABOLIC PANEL WITH GFR
BUN: 15 mg/dL (ref 7–25)
CHLORIDE: 102 mmol/L (ref 98–110)
CO2: 28 mmol/L (ref 20–31)
Calcium: 9.3 mg/dL (ref 8.6–10.3)
Creat: 0.97 mg/dL (ref 0.70–1.18)
GFR, Est African American: 89 mL/min (ref >=60)
GFR, Est Non African American: 77 mL/min (ref >=60)
Glucose, Bld: 86 mg/dL (ref 65–99)
Potassium: 4.5 mmol/L (ref 3.5–5.3)
SODIUM: 140 mmol/L (ref 135–146)

## 2015-07-26 LAB — HEPATIC FUNCTION PANEL
ALBUMIN: 4 g/dL (ref 3.6–5.1)
ALT: 15 U/L (ref 9–46)
AST: 18 U/L (ref 10–35)
Alkaline Phosphatase: 89 U/L (ref 40–115)
BILIRUBIN DIRECT: 0.1 mg/dL (ref <=0.2)
Indirect Bilirubin: 0.4 mg/dL (ref 0.2–1.2)
TOTAL PROTEIN: 7.1 g/dL (ref 6.1–8.1)
Total Bilirubin: 0.5 mg/dL (ref 0.2–1.2)

## 2015-07-26 LAB — MAGNESIUM: MAGNESIUM: 2.2 mg/dL (ref 1.5–2.5)

## 2015-07-26 MED ORDER — EZETIMIBE 10 MG PO TABS
10.0000 mg | ORAL_TABLET | Freq: Every day | ORAL | Status: DC
Start: 2015-07-26 — End: 2016-01-02

## 2015-07-26 MED ORDER — AZITHROMYCIN 250 MG PO TABS: ORAL_TABLET | ORAL | Status: DC

## 2015-07-26 NOTE — Progress Notes (Signed)
Patient ID: Lavenia Atlas, male   DOB: September 02, 1941, 74 y.o.   MRN: [id number]  Assessment and Plan:  Hypertension:  -Continue medication,  -monitor blood pressure at home.  -Continue DASH diet.   -Reminder to go to the ER if any CP, SOB, nausea, dizziness, severe HA, changes vision/speech, left arm numbness and tingling, and jaw pain.  Cholesterol: -Continue diet and exercise.  -Check cholesterol.   Pre-diabetes: -Continue diet and exercise.  -Check A1C  Vitamin D Def: -check level -continue medications.   Continue diet and meds as discussed. Further disposition pending results of labs.  HPI 74 y.o. male  presents for 3 month follow up with hypertension, hyperlipidemia, prediabetes and vitamin D.   His blood pressure has been controlled at home, today their BP is BP: 128/70 mmHg.   He does workout. He denies chest pain, shortness of breath, dizziness.   He is on cholesterol medication and denies myalgias. His cholesterol is not at goal. The cholesterol last visit was:   Lab Results  Component Value Date   CHOL 230* 04/24/2015   HDL 52 04/24/2015   LDLCALC 153* 04/24/2015   TRIG 124 04/24/2015   CHOLHDL 4.4 04/24/2015     He has been working on diet and exercise for prediabetes, and denies foot ulcerations, hyperglycemia, hypoglycemia , increased appetite, nausea, paresthesia of the feet, polydipsia, polyuria, visual disturbances, vomiting and weight loss. Last A1C in the office was:  Lab Results  Component Value Date   HGBA1C 6.1* 04/24/2015    Patient is on Vitamin D supplement.  Lab Results  Component Value Date   VD25OH 50 04/24/2015      Current Medications:  Current Outpatient Prescriptions on File Prior to Visit  Medication Sig Dispense Refill  . atorvastatin (LIPITOR) 40 MG tablet Take 40 mg by mouth daily. Taking 10 mg    . cetirizine (ZYRTEC) 10 MG tablet TAKE 1 TABLET DAILY FOR ALLERGIES 90 tablet 99  . ergocalciferol (VITAMIN D2) 50000 UNITS capsule  Take 1 capsule (50,000 Units total) by mouth daily. For severe vitamin D def 90 capsule 2  . Fluticasone-Salmeterol (ADVAIR DISKUS) 250-50 MCG/DOSE AEPB Inhale 1 puff into the lungs 2 (two) times daily. 60 each 4  . Ipratropium-Albuterol (COMBIVENT RESPIMAT) 20-100 MCG/ACT AERS respimat Inhale 1 puff into the lungs every 4 (four) hours as needed for wheezing. 4 g 4  . losartan (COZAAR) 50 MG tablet TAKE 1 TABLET DAILY FOR BLOOD PRESSURE 90 tablet 1  . verapamil (CALAN-SR) 240 MG CR tablet TAKE 1 TABLET DAILY WITH FOOD 90 tablet 48  . ezetimibe (ZETIA) 10 MG tablet Take 10 mg by mouth daily.     No current facility-administered medications on file prior to visit.    Medical History:  Past Medical History  Diagnosis Date  . Hyperlipidemia   . Hypertension   . Prediabetes   . Vitamin D deficiency   . Asthma   . GERD (gastroesophageal reflux disease)   . Allergy   . History of colon cancer   . BPH (benign prostatic hyperplasia)     Allergies:  Allergies  Allergen Reactions  . Mavik [Trandolapril]     Cough  . Hctz [Hydrochlorothiazide] Rash    Photosensitivity     Review of Systems:  Review of Systems  Constitutional: Negative for fever, chills and malaise/fatigue.  HENT: Negative for congestion, ear pain and sore throat.   Eyes: Negative.   Respiratory: Negative for cough, shortness of breath and wheezing.   Cardiovascular:  Negative for chest pain, palpitations and leg swelling.  Gastrointestinal: Negative for heartburn, diarrhea, constipation, blood in stool and melena.  Genitourinary: Negative.   Skin: Negative.   Neurological: Negative for dizziness, loss of consciousness and headaches.  Psychiatric/Behavioral: Negative for depression. The patient is not nervous/anxious and does not have insomnia.     Family history- Review and unchanged  Social history- Review and unchanged  Physical Exam: BP 128/70 mmHg  Pulse 84  Temp(Src) 97.8 F (36.6 C) (Temporal)  Resp  18  Ht 5' 8.5" (1.74 m)  Wt 168 lb (76.204 kg)  BMI 25.17 kg/m2 Wt Readings from Last 3 Encounters:  07/26/15 168 lb (76.204 kg)  04/24/15 169 lb 3.2 oz (76.749 kg)  01/02/15 171 lb 6.4 oz (77.747 kg)    General Appearance: Well nourished well developed, in no apparent distress. Eyes: PERRLA, EOMs, conjunctiva no swelling or erythema ENT/Mouth: Ear canals normal without obstruction, swelling, erythma, discharge.  TMs normal bilaterally.  Oropharynx moist, clear, without exudate, or postoropharyngeal swelling. Neck: Supple, thyroid normal,no cervical adenopathy  Respiratory: Respiratory effort normal, Breath sounds clear A&P without rhonchi, wheeze, or rale.  No retractions, no accessory usage. Cardio: RRR with no MRGs. Brisk peripheral pulses without edema.  Abdomen: Soft, + BS,  Non tender, no guarding, rebound, hernias, masses. Musculoskeletal: Full ROM, 5/5 strength, Normal gait Skin: Warm, dry without rashes, lesions, ecchymosis.  Neuro: Awake and oriented X 3, Cranial nerves intact. Normal muscle tone, no cerebellar symptoms. Psych: Normal affect, Insight and Judgment appropriate.    Starlyn Skeans, PA-C 10:42 AM [facility]

## 2015-07-26 NOTE — Patient Instructions (Signed)
Carbamide Peroxide ear solution What is this medicine? CARBAMIDE PEROXIDE (CAR bah mide per OX ide) is used to soften and help remove ear wax. This medicine may be used for other purposes; ask your health care provider or pharmacist if you have questions. COMMON BRAND NAME(S): Auro Ear, Auro Earache Relief, Debrox, Ear Drops, Ear Wax Removal, Ear Wax Remover, Earwax Treatment, Murine, Thera-Ear What should I tell my health care provider before I take this medicine? They need to know if you have any of these conditions: -dizziness -ear discharge -ear pain, irritation or rash -infection -perforated eardrum (hole in eardrum) -an unusual or allergic reaction to carbamide peroxide, glycerin, hydrogen peroxide, other medicines, foods, dyes, or preservatives -pregnant or trying to get pregnant -breast-feeding How should I use this medicine? This medicine is only for use in the outer ear canal. Follow the directions carefully. Wash hands before and after use. The solution may be warmed by holding the bottle in the hand for 1 to 2 minutes. Lie with the affected ear facing upward. Place the proper number of drops into the ear canal. After the drops are instilled, remain lying with the affected ear upward for 5 minutes to help the drops stay in the ear canal. A cotton ball may be gently inserted at the ear opening for no longer than 5 to 10 minutes to ensure retention. Repeat, if necessary, for the opposite ear. Do not touch the tip of the dropper to the ear, fingertips, or other surface. Do not rinse the dropper after use. Keep container tightly closed. Talk to your pediatrician regarding the use of this medicine in children. While this drug may be used in children as young as 12 years for selected conditions, precautions do apply. Overdosage: If you think you have taken too much of this medicine contact a poison control center or emergency room at once. NOTE: This medicine is only for you. Do not share  this medicine with others. What if I miss a dose? If you miss a dose, use it as soon as you can. If it is almost time for your next dose, use only that dose. Do not use double or extra doses. What may interact with this medicine? Interactions are not expected. Do not use any other ear products without asking your doctor or health care professional. This list may not describe all possible interactions. Give your health care provider a list of all the medicines, herbs, non-prescription drugs, or dietary supplements you use. Also tell them if you smoke, drink alcohol, or use illegal drugs. Some items may interact with your medicine. What should I watch for while using this medicine? This medicine is not for long-term use. Do not use for more than 4 days without checking with your health care professional. Contact your doctor or health care professional if your condition does not start to get better within a few days or if you notice burning, redness, itching or swelling. What side effects may I notice from receiving this medicine? Side effects that you should report to your doctor or health care professional as soon as possible: -allergic reactions like skin rash, itching or hives, swelling of the face, lips, or tongue -burning, itching, and redness -worsening ear pain -rash Side effects that usually do not require medical attention (report to your doctor or health care professional if they continue or are bothersome): -abnormal sensation while putting the drops in the ear -temporary reduction in hearing (but not complete loss of hearing) This list may not  describe all possible side effects. Call your doctor for medical advice about side effects. You may report side effects to FDA at 1-800-FDA-1088. Where should I keep my medicine? Keep out of the reach of children. Store at room temperature between 15 and 30 degrees C (59 and 86 degrees F) in a tight, light-resistant container. Keep bottle away  from excessive heat and direct sunlight. Throw away any unused medicine after the expiration date. NOTE: This sheet is a summary. It may not cover all possible information. If you have questions about this medicine, talk to your doctor, pharmacist, or health care provider.  2015, Elsevier/Gold Standard. (2008-02-01 14:00:02)  

## 2015-07-27 LAB — INSULIN, RANDOM: INSULIN: 3.2 u[IU]/mL (ref 2.0–19.6)

## 2015-07-27 LAB — VITAMIN D 25 HYDROXY (VIT D DEFICIENCY, FRACTURES): VIT D 25 HYDROXY: 82 ng/mL (ref 30–100)

## 2015-07-27 LAB — HEMOGLOBIN A1C
HEMOGLOBIN A1C: 5.9 % — AB (ref <5.7)
MEAN PLASMA GLUCOSE: 123 mg/dL — AB (ref <117)

## 2015-09-19 ENCOUNTER — Encounter: Payer: Self-pay | Admitting: Internal Medicine

## 2015-10-25 ENCOUNTER — Ambulatory Visit (INDEPENDENT_AMBULATORY_CARE_PROVIDER_SITE_OTHER): Payer: Medicare Other | Admitting: Internal Medicine

## 2015-10-25 VITALS — BP 126/82 | HR 80 | Temp 97.7°F | Resp 16 | Ht 67.0 in | Wt 172.6 lb

## 2015-10-25 DIAGNOSIS — Z79899 Other long term (current) drug therapy: Secondary | ICD-10-CM

## 2015-10-25 DIAGNOSIS — Z1331 Encounter for screening for depression: Secondary | ICD-10-CM

## 2015-10-25 DIAGNOSIS — Z9181 History of falling: Secondary | ICD-10-CM

## 2015-10-25 DIAGNOSIS — Z85038 Personal history of other malignant neoplasm of large intestine: Secondary | ICD-10-CM | POA: Diagnosis not present

## 2015-10-25 DIAGNOSIS — K219 Gastro-esophageal reflux disease without esophagitis: Secondary | ICD-10-CM | POA: Diagnosis not present

## 2015-10-25 DIAGNOSIS — R7309 Other abnormal glucose: Secondary | ICD-10-CM

## 2015-10-25 DIAGNOSIS — Z125 Encounter for screening for malignant neoplasm of prostate: Secondary | ICD-10-CM | POA: Diagnosis not present

## 2015-10-25 DIAGNOSIS — Z1389 Encounter for screening for other disorder: Secondary | ICD-10-CM | POA: Diagnosis not present

## 2015-10-25 DIAGNOSIS — E785 Hyperlipidemia, unspecified: Secondary | ICD-10-CM

## 2015-10-25 DIAGNOSIS — E559 Vitamin D deficiency, unspecified: Secondary | ICD-10-CM

## 2015-10-25 DIAGNOSIS — R6889 Other general symptoms and signs: Secondary | ICD-10-CM | POA: Diagnosis not present

## 2015-10-25 DIAGNOSIS — Z0001 Encounter for general adult medical examination with abnormal findings: Secondary | ICD-10-CM

## 2015-10-25 DIAGNOSIS — Z789 Other specified health status: Secondary | ICD-10-CM

## 2015-10-25 DIAGNOSIS — R7303 Prediabetes: Secondary | ICD-10-CM | POA: Diagnosis not present

## 2015-10-25 DIAGNOSIS — I10 Essential (primary) hypertension: Secondary | ICD-10-CM

## 2015-10-25 DIAGNOSIS — Z1212 Encounter for screening for malignant neoplasm of rectum: Secondary | ICD-10-CM

## 2015-10-25 DIAGNOSIS — N320 Bladder-neck obstruction: Secondary | ICD-10-CM | POA: Diagnosis not present

## 2015-10-25 LAB — CBC WITH DIFFERENTIAL/PLATELET
BASOS ABS: 0.1 10*3/uL (ref 0.0–0.1)
BASOS PCT: 2 % — AB (ref 0–1)
EOS ABS: 0.4 10*3/uL (ref 0.0–0.7)
Eosinophils Relative: 7 % — ABNORMAL HIGH (ref 0–5)
HCT: 45.8 % (ref 39.0–52.0)
HEMOGLOBIN: 15 g/dL (ref 13.0–17.0)
Lymphocytes Relative: 28 % (ref 12–46)
Lymphs Abs: 1.5 10*3/uL (ref 0.7–4.0)
MCH: 29.3 pg (ref 26.0–34.0)
MCHC: 32.8 g/dL (ref 30.0–36.0)
MCV: 89.5 fL (ref 78.0–100.0)
MONOS PCT: 10 % (ref 3–12)
MPV: 10.5 fL (ref 8.6–12.4)
Monocytes Absolute: 0.5 10*3/uL (ref 0.1–1.0)
NEUTROS ABS: 2.9 10*3/uL (ref 1.7–7.7)
NEUTROS PCT: 53 % (ref 43–77)
PLATELETS: 240 10*3/uL (ref 150–400)
RBC: 5.12 MIL/uL (ref 4.22–5.81)
RDW: 14.6 % (ref 11.5–15.5)
WBC: 5.4 10*3/uL (ref 4.0–10.5)

## 2015-10-25 NOTE — Patient Instructions (Signed)

## 2015-10-26 ENCOUNTER — Encounter: Payer: Self-pay | Admitting: Internal Medicine

## 2015-10-26 LAB — HEPATIC FUNCTION PANEL
ALBUMIN: 4.1 g/dL (ref 3.6–5.1)
ALT: 21 U/L (ref 9–46)
AST: 19 U/L (ref 10–35)
Alkaline Phosphatase: 82 U/L (ref 40–115)
Bilirubin, Direct: 0.1 mg/dL (ref <=0.2)
Indirect Bilirubin: 0.4 mg/dL (ref 0.2–1.2)
TOTAL PROTEIN: 7 g/dL (ref 6.1–8.1)
Total Bilirubin: 0.5 mg/dL (ref 0.2–1.2)

## 2015-10-26 LAB — LIPID PANEL
CHOL/HDL RATIO: 3.2 ratio (ref <=5.0)
CHOLESTEROL: 186 mg/dL (ref 125–200)
HDL: 59 mg/dL (ref >=40)
LDL Cholesterol: 106 mg/dL (ref <130)
TRIGLYCERIDES: 106 mg/dL (ref <150)
VLDL: 21 mg/dL (ref <30)

## 2015-10-26 LAB — MICROALBUMIN / CREATININE URINE RATIO: CREATININE, URINE: 45 mg/dL (ref 20–370)

## 2015-10-26 LAB — URINALYSIS, ROUTINE W REFLEX MICROSCOPIC
BILIRUBIN URINE: NEGATIVE
GLUCOSE, UA: NEGATIVE
Hgb urine dipstick: NEGATIVE
Ketones, ur: NEGATIVE
LEUKOCYTES UA: NEGATIVE
Nitrite: NEGATIVE
PROTEIN: NEGATIVE
SPECIFIC GRAVITY, URINE: 1.007 (ref 1.001–1.035)
pH: 6.5 (ref 5.0–8.0)

## 2015-10-26 LAB — MAGNESIUM: Magnesium: 2.3 mg/dL (ref 1.5–2.5)

## 2015-10-26 LAB — BASIC METABOLIC PANEL WITH GFR
BUN: 16 mg/dL (ref 7–25)
CALCIUM: 9 mg/dL (ref 8.6–10.3)
CHLORIDE: 100 mmol/L (ref 98–110)
CO2: 27 mmol/L (ref 20–31)
Creat: 0.87 mg/dL (ref 0.70–1.18)
GFR, EST NON AFRICAN AMERICAN: 85 mL/min (ref >=60)
Glucose, Bld: 87 mg/dL (ref 65–99)
POTASSIUM: 4.2 mmol/L (ref 3.5–5.3)
SODIUM: 138 mmol/L (ref 135–146)

## 2015-10-26 LAB — HEMOGLOBIN A1C
HEMOGLOBIN A1C: 5.9 % — AB (ref <5.7)
MEAN PLASMA GLUCOSE: 123 mg/dL — AB (ref <117)

## 2015-10-26 LAB — INSULIN, RANDOM: Insulin: 3.8 u[IU]/mL (ref 2.0–19.6)

## 2015-10-26 LAB — TSH: TSH: 1.618 u[IU]/mL (ref 0.350–4.500)

## 2015-10-26 LAB — VITAMIN D 25 HYDROXY (VIT D DEFICIENCY, FRACTURES): VIT D 25 HYDROXY: 81 ng/mL (ref 30–100)

## 2015-10-26 LAB — PSA: PSA: 0.47 ng/mL (ref <=4.00)

## 2015-10-26 NOTE — Progress Notes (Signed)
Patient ID: Jon [name], male   DOB: 1941/10/27, 74 y.o.   MRN: [id number]  Medicare Annual Wellness Visit and  Comprehensive Evaluation & Examination    Assessment:   1. Essential hypertension  - Microalbumin / creatinine urine ratio - EKG 12-Lead - [place], RETROPERITNL ABD,  LTD - TSH  2. Hyperlipidemia  - Lipid panel - TSH  3. Prediabetes  - Hemoglobin A1c - Insulin, random  4. Vitamin D deficiency  - VITAMIN D 25 Hydroxy   5. Gastroesophageal reflux disease   6. History of colon cancer   7. Screening for rectal cancer  - POC Hemoccult Bld/Stl   8. Prostate cancer screening  - PSA  9. Depression screen   10. At low risk for fall   11. Bladder neck obstruction  - PSA  12. Other abnormal glucose  - Hemoglobin A1c - Insulin, random  13. Medication management  - Urinalysis, Routine w reflex microscopic  - CBC with Differential/Platelet - BASIC METABOLIC PANEL WITH GFR - Hepatic function panel - Magnesium  Plan:   During the course of the visit the patient was educated and counseled about appropriate screening and preventive services including:    Pneumococcal vaccine   Influenza vaccine  Td vaccine  Screening electrocardiogram  Bone densitometry screening  Colorectal cancer screening  Diabetes screening  Glaucoma screening  Nutrition counseling   Advanced directives: requested  Screening recommendations, referrals: Vaccinations: Immunization History  Administered Date(s) Administered  . DT 07/10/2009  . Influenza Split 07/28/2013  . Influenza, High Dose Seasonal PF 09/18/2014, 07/26/2015  . Pneumococcal Polysaccharide-23 02/22/2007  Prevnar vaccine deferred -out of stock Shingles vaccine - referred to Pharmacy for [place] to cover Hep B vaccine not indicated  Nutrition assessed and recommended  Colonoscopy 03/17/2011 Recommended yearly ophthalmology/optometry visit for glaucoma screening and checkup Recommended  yearly dental visit for hygiene and checkup Advanced directives - Yes  Conditions/risks identified: BMI: Discussed weight loss, diet, and increase physical activity.  Increase physical activity: AHA recommends 150 minutes of physical activity a week.  Medications reviewed PreDiabetes is not at goal, ACE/ARB therapy: Yes. Urinary Incontinence is not an issue: discussed non pharmacology and pharmacology options.  Fall risk: low- discussed PT, home fall assessment, medications.   Subjective:      Jon [name]  presents for [employer] Visit and complete physical.  Date of last medicare wellness visit was Nov 2015. This very nice 74 y.o. married Filipino Man presents for  follow up with Hypertension, Hyperlipidemia, Pre-Diabetes and Vitamin D Deficiency.      Patient is treated for HTN & BP has been controlled at home. Today's BP: 126/82 mmHg. Patient has had no complaints of any cardiac type chest pain, palpitations, dyspnea/orthopnea/PND, dizziness, claudication, or dependent edema.     Hyperlipidemia is controlled with diet & meds. Patient denies myalgias or other med SE's. Current Lipids are near, but not at goal with Cholesterol 186; HDL 59; LDL 106; Triglycerides 106.          Also, the patient has history of PreDiabetes with A1c 6.3% in Sept 2010 and he has had no symptoms of reactive hypoglycemia, diabetic polys, paresthesias or visual blurring.  Current A1c is 5.9%.      Further, the patient also has history of Vitamin D Deficiency of 24 in 2008 and supplements vitamin D without any suspected side-effects. Current vitamin D is at goal with level of  81.   Names of Other Physician/Practitioners you currently use: 1. [facility] here for primary care 2. Dr Posey Pronto, [place], eye doctor, last visit 2015 3. Dr Orvil Feil, [place], dentist, last visit 2016 & every 6 months  Patient Care Team: Unk Pinto, MD as PCP - General (Internal  Medicine) Calvert Cantor, MD as Consulting Physician (Ophthalmology) Richmond Campbell, MD as Consulting Physician (Gastroenterology) Ladell Pier, MD as Consulting Physician (Oncology)  Medication Review: Medication Sig  . atorvastatin (LIPITOR) 40 MG tablet Take 40 mg by mouth daily. Taking 10 mg  . cetirizine (ZYRTEC) 10 MG tablet TAKE 1 TABLET DAILY FOR ALLERGIES  . VITAMIN D2 50,000 UNITS Take 1 capsule (50,000 Units total) by mouth daily. For severe vitamin D def  . Ezetimibe 10 MG tablet Take 1 tablet (10 mg total) by mouth daily.  Marland Kitchen ADVAIR DISKUS 250-50  Inhale 1 puff into the lungs 2 (two) times daily.  . COMBIVENT RESPIMAT Inhale 1 puff into the lungs every 4 (four) hours as needed for wheezing.  Marland Kitchen losartan  50 MG tablet TAKE 1 TABLET DAILY FOR BLOOD PRESSURE  . verapamil-SR 240 MG CR TAKE 1 TABLET DAILY WITH FOOD   Allergies  Allergen Reactions  . Mavik [Trandolapril]     Cough  . Hctz [Hydrochlorothiazide] Rash    Photosensitivity   Current Problems (verified) Patient Active Problem List   Diagnosis Date Noted  . Medication management 02/16/2014  . Hyperlipidemia   . Hypertension   . Vitamin D deficiency   . Prediabetes   . Asthma   . GERD (gastroesophageal reflux disease)   . History of colon cancer   . BPH (benign prostatic hyperplasia)     Screening Tests Health Maintenance  Topic Date Due  . ZOSTAVAX  04/26/2001  . PNA vac Low Risk Adult (2 of 2 - PCV13) 02/22/2008  . INFLUENZA VACCINE  06/03/2016  . TETANUS/TDAP  04/20/2019  . COLONOSCOPY  03/16/2021    Immunization History  Administered Date(s) Administered  . DT 07/10/2009  . Influenza Split 07/28/2013  . Influenza, High Dose Seasonal PF 09/18/2014, 07/26/2015  . Pneumococcal Polysaccharide-23 02/22/2007    Preventative care: Last colonoscopy: 03/17/2011  Past Medical History  Diagnosis Date  . Hyperlipidemia   . Hypertension   . Prediabetes   . Vitamin D deficiency   . Asthma   . GERD  (gastroesophageal reflux disease)   . Allergy   . History of colon cancer   . BPH (benign prostatic hyperplasia)     Past Surgical History  Procedure Laterality Date  . Colon surgery  2004    Dr Rosana Hoes    Risk Factors: Tobacco Social History  Substance Use Topics  . Smoking status: Never Smoker   . Smokeless tobacco: Never Used  . Alcohol Use: No   He does not smoke.  Patient is not a former smoker. Are there smokers in your home (other than you)?  No  Alcohol Current alcohol use: none  Caffeine Current caffeine use: coffee 2-3 cups /day  Exercise Current exercise: cardiovascular workout on exercise equipment, walking and weightlifting  Nutrition/Diet Current diet: in general, a "healthy" diet    Cardiac risk factors: advanced age (older than 18 for men, 72 for women), dyslipidemia, hypertension and male gender.  Depression Screen (Note: if answer to either of the following is "Yes", a more complete depression screening is indicated)   Q1: Over the past two weeks, have you felt down, depressed or hopeless? No  Q2: Over the past two weeks, have you felt little interest or  pleasure in doing things? No  Have you lost interest or pleasure in daily life? No  Do you often feel hopeless? No  Do you cry easily over simple problems? No  Activities of Daily Living In your present state of health, do you have any difficulty performing the following activities?:  Driving? No Managing money?  No Feeding yourself? No Getting from bed to chair? No Climbing a flight of stairs? No Preparing food and eating?: No Bathing or showering? No Getting dressed: No Getting to the toilet? No Using the toilet:No Moving around from place to place: No In the past year have you fallen or had a near fall?:No   Are you sexually active?  Yes  Do you have more than one partner?  No  Vision Difficulties: No  Hearing Difficulties: No Do you often ask people to speak up or repeat  themselves? No Do you experience ringing or noises in your ears? No Do you have difficulty understanding soft or whispered voices? No  Cognition  Do you feel that you have a problem with memory?No  Do you often misplace items? No  Do you feel safe at home?  Yes  Advanced directives Does patient have a [place]? Yes Does patient have a Living Will? Yes  ROS: Constitutional: Denies fever, chills, weight loss/gain, headaches, insomnia, fatigue, night sweats or change in appetite. Eyes: Denies redness, blurred vision, diplopia, discharge, itchy or watery eyes.  ENT: Denies discharge, congestion, post nasal drip, epistaxis, sore throat, earache, hearing loss, dental pain, Tinnitus, Vertigo, Sinus pain or snoring.  Cardio: Denies chest pain, palpitations, irregular heartbeat, syncope, dyspnea, diaphoresis, orthopnea, PND, claudication or edema Respiratory: denies cough, dyspnea, DOE, pleurisy, hoarseness, laryngitis or wheezing.  Gastrointestinal: Denies dysphagia, heartburn, reflux, water brash, pain, cramps, nausea, vomiting, bloating, diarrhea, constipation, hematemesis, melena, hematochezia, jaundice or hemorrhoids Genitourinary: Denies dysuria, frequency, urgency, nocturia, hesitancy, discharge, hematuria or flank pain Musculoskeletal: Denies arthralgia, myalgia, stiffness, Jt. Swelling, pain, limp or strain/sprain. Denies Falls. Skin: Denies puritis, rash, hives, warts, acne, eczema or change in skin lesion Neuro: No weakness, tremor, incoordination, spasms, paresthesia or pain Psychiatric: Denies confusion, memory loss or sensory loss. Denies Depression. Endocrine: Denies change in weight, skin, hair change, nocturia, and paresthesia, diabetic polys, visual blurring or hyper / hypo glycemic episodes.  Heme/Lymph: No excessive bleeding, bruising or enlarged lymph nodes.  Objective:     BP 126/82 mmHg  Pulse 80  Temp(Src) 97.7 F (36.5 C)  Resp 16  Ht 5\' 7"   (1.702 m)  Wt 172 lb 9.6 oz (78.291 kg)  BMI 27.03 kg/m2  General Appearance:  Alert  WD/WN, male  in no apparent distress. Eyes: PERRLA, EOMs nl, conjunctiva normal, normal fundi and vessels. Sinuses: No frontal/maxillary tenderness ENT/Mouth: EACs patent / TMs  nl. Nares clear without erythema, swelling, mucoid exudates. Oral hygiene is good. No erythema, swelling, or exudate. Tongue normal, non-obstructing. Tonsils not swollen or erythematous. Hearing normal.  Neck: Supple, thyroid normal. No bruits, nodes or JVD. Respiratory: Respiratory effort normal.  BS equal and clear bilateral without rales, rhonci, wheezing or stridor. Cardio: Heart sounds are normal with regular rate and rhythm and no murmurs, rubs or gallops. Peripheral pulses are normal and equal bilaterally without edema. No aortic or femoral bruits. Chest: symmetric with normal excursions and percussion.  Abdomen: Flat, soft with nl bowel sounds. Nontender, no guarding, rebound, hernias, masses, or organomegaly.  Lymphatics: Non tender without lymphadenopathy.  Genitourinary: No hernias.Testes nl. DRE - prostate nl  for age - smooth & firm w/o nodules. Musculoskeletal: Full ROM all peripheral extremities, joint stability, 5/5 strength, and normal gait. Skin: Warm and dry without rashes, lesions, cyanosis, clubbing or  ecchymosis.  Neuro: Cranial nerves intact, reflexes equal bilaterally. Normal muscle tone, no cerebellar symptoms. Sensation intact.  Pysch: Alert and oriented X 3 with normal affect, insight and judgment appropriate.   Cognitive Testing  Alert? Yes  Normal Appearance? Yes  Oriented to person? Yes  Place? Yes   Time? Yes  Recall of three objects?  Yes  Can perform simple calculations? Yes  Displays appropriate judgment? Yes  Can read the correct time from a watch/clock? Yes  Medicare Attestation I have personally reviewed: The patient's medical and social history Their use of alcohol, tobacco or illicit  drugs Their current medications and supplements The patient's functional ability including ADLs,fall risks, home safety risks, cognitive, and hearing and visual impairment Diet and physical activities Evidence for depression or mood disorders  The patient's weight, height, BMI, and visual acuity have been recorded in the chart.  I have made referrals, counseling, and provided education to the patient based on review of the above and I have provided the patient with a written personalized care plan for preventive services.  Over 40 minutes of exam, counseling, chart review was performed.  Antaeus Karel DAVID, MD   10/26/2015

## 2015-12-25 ENCOUNTER — Encounter: Payer: Self-pay | Admitting: Internal Medicine

## 2015-12-25 ENCOUNTER — Ambulatory Visit (INDEPENDENT_AMBULATORY_CARE_PROVIDER_SITE_OTHER): Payer: Medicare PPO | Admitting: Internal Medicine

## 2015-12-25 VITALS — BP 124/70 | HR 104 | Temp 101.0°F | Resp 18 | Ht 67.0 in | Wt 174.0 lb

## 2015-12-25 DIAGNOSIS — J069 Acute upper respiratory infection, unspecified: Secondary | ICD-10-CM

## 2015-12-25 MED ORDER — PREDNISONE 20 MG PO TABS: ORAL_TABLET | ORAL | Status: DC

## 2015-12-25 MED ORDER — AZITHROMYCIN 250 MG PO TABS: ORAL_TABLET | ORAL | Status: DC

## 2015-12-25 MED ORDER — AZELASTINE HCL 0.1 % NA SOLN: 2.0000 | Freq: Two times a day (BID) | NASAL | Status: DC

## 2015-12-25 MED ORDER — PHENYLEPH-PROMETHAZINE-COD 5-6.25-10 MG/5ML PO SYRP: ORAL_SOLUTION | ORAL | Status: DC

## 2015-12-25 NOTE — Progress Notes (Signed)
Patient ID: Jon [name], male   DOB: 04/20/1941, 75 y.o.   MRN: [id number]  HPI  Patient presents to the office for evaluation of cough, wheezing, and body aches.  It has been going on for 2 days.  Patient reports wet, cough with yellow sputum production.  They also endorse change in voice, chills, fever, postnasal drip, shortness of breath, wheezing and nasal congestion, rhinorrhea, sore throat.  .  They have tried cough medication and ibuprofen.  They report that nothing has worked.  They admits to other sick contacts.  He reports that both his wife and daughter have had very similar symptoms.  He did have flu shot this year.    Review of Systems  Constitutional: Positive for fever, chills and malaise/fatigue.  HENT: Positive for congestion and sore throat. Negative for ear pain.   Respiratory: Positive for cough, sputum production, shortness of breath and wheezing.   Cardiovascular: Negative for chest pain, palpitations and leg swelling.  Neurological: Positive for headaches.    PE:  General:  Alert and non-toxic, WDWN, NAD HEENT: NCAT, PERLA, EOM normal, no occular discharge or erythema.  Nasal mucosal edema with sinus tenderness to palpation.  Oropharynx clear with minimal oropharyngeal edema and erythema.  Mucous membranes moist and pink. Neck:  Cervical adenopathy Chest:  RRR no MRGs.  Lungs clear to auscultation A&P with no wheezes rhonchi or rales.   Abdomen: +BS x 4 quadrants, soft, non-tender, no guarding, rigidity, or rebound. Skin: warm and dry no rash Neuro: A&Ox4, CN II-XII grossly intact  Assessment and Plan:   1. Acute URI  - azithromycin (ZITHROMAX Z-PAK) 250 MG tablet; 2 po day one, then 1 daily x 4 days  Dispense: 6 tablet; Refill: 0 - Phenyleph-Promethazine-Cod 5-6.25-10 MG/5ML SYRP; Take 5-10 mL po q8hrs prn for cold symptoms  Dispense: 240 mL; Refill: 0 - predniSONE (DELTASONE) 20 MG tablet; 3 tabs po daily x 3 days, then 2 tabs x 3 days, then 1.5 tabs x 3  days, then 1 tab x 3 days, then 0.5 tabs x 3 days  Dispense: 27 tablet; Refill: 0 - azelastine (ASTELIN) 0.1 % nasal spray; Place 2 sprays into both nostrils 2 (two) times daily. Use in each nostril as directed  Dispense: 30 mL; Refill: 2

## 2016-01-02 ENCOUNTER — Other Ambulatory Visit: Payer: Self-pay | Admitting: Internal Medicine

## 2016-01-19 ENCOUNTER — Other Ambulatory Visit: Payer: Self-pay | Admitting: Internal Medicine

## 2016-02-07 ENCOUNTER — Encounter: Payer: Self-pay | Admitting: Internal Medicine

## 2016-02-07 ENCOUNTER — Ambulatory Visit (INDEPENDENT_AMBULATORY_CARE_PROVIDER_SITE_OTHER): Payer: Medicare PPO | Admitting: Internal Medicine

## 2016-02-07 VITALS — BP 128/82 | HR 80 | Temp 97.8°F | Resp 18 | Ht 67.0 in | Wt 172.0 lb

## 2016-02-07 DIAGNOSIS — E785 Hyperlipidemia, unspecified: Secondary | ICD-10-CM | POA: Diagnosis not present

## 2016-02-07 DIAGNOSIS — R7303 Prediabetes: Secondary | ICD-10-CM | POA: Diagnosis not present

## 2016-02-07 DIAGNOSIS — Z79899 Other long term (current) drug therapy: Secondary | ICD-10-CM | POA: Diagnosis not present

## 2016-02-07 DIAGNOSIS — I10 Essential (primary) hypertension: Secondary | ICD-10-CM | POA: Diagnosis not present

## 2016-02-07 DIAGNOSIS — J069 Acute upper respiratory infection, unspecified: Secondary | ICD-10-CM

## 2016-02-07 DIAGNOSIS — E559 Vitamin D deficiency, unspecified: Secondary | ICD-10-CM | POA: Diagnosis not present

## 2016-02-07 DIAGNOSIS — R7309 Other abnormal glucose: Secondary | ICD-10-CM | POA: Diagnosis not present

## 2016-02-07 DIAGNOSIS — Z23 Encounter for immunization: Secondary | ICD-10-CM | POA: Diagnosis not present

## 2016-02-07 LAB — CBC WITH DIFFERENTIAL/PLATELET
BASOS PCT: 1 %
Basophils Absolute: 59 cells/uL (ref 0–200)
EOS PCT: 9 %
Eosinophils Absolute: 531 cells/uL — ABNORMAL HIGH (ref 15–500)
HCT: 43.8 % (ref 38.5–50.0)
HEMOGLOBIN: 14.5 g/dL (ref 13.2–17.1)
LYMPHS ABS: 1652 {cells}/uL (ref 850–3900)
Lymphocytes Relative: 28 %
MCH: 30.1 pg (ref 27.0–33.0)
MCHC: 33.1 g/dL (ref 32.0–36.0)
MCV: 90.9 fL (ref 80.0–100.0)
MONOS PCT: 9 %
MPV: 10.4 fL (ref 7.5–12.5)
Monocytes Absolute: 531 cells/uL (ref 200–950)
NEUTROS ABS: 3127 {cells}/uL (ref 1500–7800)
Neutrophils Relative %: 53 %
PLATELETS: 211 10*3/uL (ref 140–400)
RBC: 4.82 MIL/uL (ref 4.20–5.80)
RDW: 14.4 % (ref 11.0–15.0)
WBC: 5.9 10*3/uL (ref 3.8–10.8)

## 2016-02-07 LAB — LIPID PANEL
CHOL/HDL RATIO: 3.2 ratio (ref <=5.0)
CHOLESTEROL: 183 mg/dL (ref 125–200)
HDL: 57 mg/dL (ref >=40)
LDL Cholesterol: 107 mg/dL (ref <130)
Triglycerides: 95 mg/dL (ref <150)
VLDL: 19 mg/dL (ref <30)

## 2016-02-07 LAB — BASIC METABOLIC PANEL WITH GFR
BUN: 16 mg/dL (ref 7–25)
CHLORIDE: 104 mmol/L (ref 98–110)
CO2: 26 mmol/L (ref 20–31)
Calcium: 8.7 mg/dL (ref 8.6–10.3)
Creat: 1.04 mg/dL (ref 0.70–1.18)
GFR, EST NON AFRICAN AMERICAN: 70 mL/min (ref >=60)
GFR, Est African American: 81 mL/min (ref >=60)
GLUCOSE: 86 mg/dL (ref 65–99)
POTASSIUM: 4.4 mmol/L (ref 3.5–5.3)
SODIUM: 141 mmol/L (ref 135–146)

## 2016-02-07 LAB — HEPATIC FUNCTION PANEL
ALBUMIN: 4.1 g/dL (ref 3.6–5.1)
ALT: 19 U/L (ref 9–46)
AST: 24 U/L (ref 10–35)
Alkaline Phosphatase: 85 U/L (ref 40–115)
BILIRUBIN TOTAL: 0.5 mg/dL (ref 0.2–1.2)
Bilirubin, Direct: 0.1 mg/dL (ref <=0.2)
Indirect Bilirubin: 0.4 mg/dL (ref 0.2–1.2)
Total Protein: 7.1 g/dL (ref 6.1–8.1)

## 2016-02-07 LAB — TSH: TSH: 1.52 m[IU]/L (ref 0.40–4.50)

## 2016-02-07 MED ORDER — AZITHROMYCIN 250 MG PO TABS: ORAL_TABLET | ORAL | Status: DC

## 2016-02-07 NOTE — Patient Instructions (Addendum)
Please try simethicone if you have really bad gas.    Please Jon [name] which is a greek yogurt.

## 2016-02-07 NOTE — Progress Notes (Signed)
Assessment and Plan:  Hypertension:  -Continue medication,  -monitor blood pressure at home.  -Continue DASH diet.   -Reminder to go to the ER if any CP, SOB, nausea, dizziness, severe HA, changes vision/speech, left arm numbness and tingling, and jaw pain.  Cholesterol: -Continue diet and exercise.  -Check cholesterol.   Pre-diabetes: -Continue diet and exercise.  -Check A1C  Vitamin D Def: -check level -continue medications.   Allergic rhinits and asthma -cont daily zyrtec -cont combivent -cont albuterol prn -wants to have zpak on hand given for emergencies  Continue diet and meds as discussed. Further disposition pending results of labs.  HPI 75 y.o. male  presents for 3 month follow up with hypertension, hyperlipidemia, prediabetes and vitamin D.   His blood pressure has been controlled at home, today their BP is BP: 128/82 mmHg.   He does workout. He denies chest pain, shortness of breath, dizziness.   He is on cholesterol medication and denies myalgias. His cholesterol is at goal. The cholesterol last visit was:   Lab Results  Component Value Date   CHOL 186 10/25/2015   HDL 59 10/25/2015   LDLCALC 106 10/25/2015   TRIG 106 10/25/2015   CHOLHDL 3.2 10/25/2015     He has been working on diet and exercise for prediabetes, and denies foot ulcerations, hyperglycemia, hypoglycemia , increased appetite, nausea, paresthesia of the feet, polydipsia, polyuria, visual disturbances, vomiting and weight loss. Last A1C in the office was:  Lab Results  Component Value Date   HGBA1C 5.9* 10/25/2015    Patient is on Vitamin D supplement.  Lab Results  Component Value Date   VD25OH 36 10/25/2015     He is going to run a 10K.  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    Current Medications:  Current Outpatient Prescriptions on File Prior to Visit  Medication Sig Dispense Refill  . atorvastatin (LIPITOR) 40 MG tablet Take 40 mg by mouth daily. Taking 10 mg     . cetirizine (ZYRTEC) 10 MG tablet TAKE 1 TABLET DAILY FOR ALLERGIES 90 tablet 99  . ergocalciferol (VITAMIN D2) 50000 UNITS capsule Take 1 capsule (50,000 Units total) by mouth daily. For severe vitamin D def 90 capsule 2  . Fluticasone-Salmeterol (ADVAIR DISKUS) 250-50 MCG/DOSE AEPB Inhale 1 puff into the lungs 2 (two) times daily. 60 each 4  . Ipratropium-Albuterol (COMBIVENT RESPIMAT) 20-100 MCG/ACT AERS respimat Inhale 1 puff into the lungs every 4 (four) hours as needed for wheezing. 4 g 4  . losartan (COZAAR) 50 MG tablet TAKE 1 TABLET DAILY FOR BLOOD PRESSURE 90 tablet 0  . verapamil (CALAN-SR) 240 MG CR tablet TAKE 1 TABLET DAILY WITH FOOD 90 tablet 48  . ZETIA 10 MG tablet TAKE 1 TABLET DAILY 90 tablet 0   No current facility-administered medications on file prior to visit.    Medical History:  Past Medical History  Diagnosis Date  . Hyperlipidemia   . Hypertension   . Prediabetes   . Vitamin D deficiency   . Asthma   . GERD (gastroesophageal reflux disease)   . Allergy   . History of colon cancer   . BPH (benign prostatic hyperplasia)     Allergies:  Allergies  Allergen Reactions  . Mavik [Trandolapril]     Cough  . Hctz [Hydrochlorothiazide] Rash    Photosensitivity     Review of Systems:  Review of Systems  Constitutional: Negative for fever, chills and malaise/fatigue.  HENT: Negative for  congestion, ear pain and sore throat.   Eyes: Negative.   Respiratory: Negative for cough, shortness of breath and wheezing.   Cardiovascular: Negative for chest pain, palpitations and leg swelling.  Gastrointestinal: Negative for heartburn, abdominal pain, diarrhea, constipation, blood in stool and melena.  Genitourinary: Negative.   Skin: Negative.   Neurological: Negative for dizziness, sensory change, loss of consciousness and headaches.  Psychiatric/Behavioral: Negative for depression. The patient is not nervous/anxious and does not have insomnia.     Family  history- Review and unchanged  Social history- Review and unchanged  Physical Exam: BP 128/82 mmHg  Pulse 80  Temp(Src) 97.8 F (36.6 C) (Temporal)  Resp 18  Ht 5\' 7"  (1.702 m)  Wt 172 lb (78.019 kg)  BMI 26.93 kg/m2 Wt Readings from Last 3 Encounters:  02/07/16 172 lb (78.019 kg)  12/25/15 174 lb (78.926 kg)  10/25/15 172 lb 9.6 oz (78.291 kg)    General Appearance: Well nourished well developed, in no apparent distress. Eyes: PERRLA, EOMs, conjunctiva no swelling or erythema ENT/Mouth: Ear canals normal without obstruction, swelling, erythma, discharge.  TMs normal bilaterally.  Oropharynx moist, clear, without exudate, or postoropharyngeal swelling. Neck: Supple, thyroid normal,no cervical adenopathy  Respiratory: Respiratory effort normal, Breath sounds clear A&P without rhonchi, wheeze, or rale.  No retractions, no accessory usage. Cardio: RRR with no MRGs. Brisk peripheral pulses without edema.  Abdomen: Soft, + BS,  Non tender, no guarding, rebound, hernias, masses. Musculoskeletal: Full ROM, 5/5 strength, Normal gait Skin: Warm, dry without rashes, lesions, ecchymosis.  Neuro: Awake and oriented X 3, Cranial nerves intact. Normal muscle tone, no cerebellar symptoms. Psych: Normal affect, Insight and Judgment appropriate.    Starlyn Skeans, PA-C 9:00 AM [facility]

## 2016-02-08 LAB — HEMOGLOBIN A1C
Hgb A1c MFr Bld: 5.9 % — ABNORMAL HIGH (ref <5.7)
MEAN PLASMA GLUCOSE: 123 mg/dL

## 2016-03-17 DIAGNOSIS — Z85038 Personal history of other malignant neoplasm of large intestine: Secondary | ICD-10-CM | POA: Diagnosis not present

## 2016-03-17 DIAGNOSIS — Z980 Intestinal bypass and anastomosis status: Secondary | ICD-10-CM | POA: Diagnosis not present

## 2016-03-17 DIAGNOSIS — Z1211 Encounter for screening for malignant neoplasm of colon: Secondary | ICD-10-CM | POA: Diagnosis not present

## 2016-03-17 DIAGNOSIS — Z09 Encounter for follow-up examination after completed treatment for conditions other than malignant neoplasm: Secondary | ICD-10-CM | POA: Diagnosis not present

## 2016-03-17 DIAGNOSIS — K641 Second degree hemorrhoids: Secondary | ICD-10-CM | POA: Diagnosis not present

## 2016-03-17 DIAGNOSIS — K648 Other hemorrhoids: Secondary | ICD-10-CM | POA: Diagnosis not present

## 2016-03-17 DIAGNOSIS — K5730 Diverticulosis of large intestine without perforation or abscess without bleeding: Secondary | ICD-10-CM | POA: Diagnosis not present

## 2016-03-17 DIAGNOSIS — Z86010 Personal history of colonic polyps: Secondary | ICD-10-CM | POA: Diagnosis not present

## 2016-03-17 DIAGNOSIS — Z08 Encounter for follow-up examination after completed treatment for malignant neoplasm: Secondary | ICD-10-CM | POA: Diagnosis not present

## 2016-03-23 ENCOUNTER — Encounter: Payer: Self-pay | Admitting: *Deleted

## 2016-03-30 ENCOUNTER — Other Ambulatory Visit: Payer: Self-pay | Admitting: Internal Medicine

## 2016-04-19 ENCOUNTER — Other Ambulatory Visit: Payer: Self-pay | Admitting: Physician Assistant

## 2016-05-08 ENCOUNTER — Other Ambulatory Visit: Payer: Self-pay | Admitting: *Deleted

## 2016-05-08 ENCOUNTER — Encounter: Payer: Self-pay | Admitting: Internal Medicine

## 2016-05-08 ENCOUNTER — Ambulatory Visit (INDEPENDENT_AMBULATORY_CARE_PROVIDER_SITE_OTHER): Payer: Medicare PPO | Admitting: Internal Medicine

## 2016-05-08 VITALS — BP 124/80 | HR 72 | Temp 97.5°F | Resp 16 | Ht 67.0 in | Wt 172.0 lb

## 2016-05-08 DIAGNOSIS — Z79899 Other long term (current) drug therapy: Secondary | ICD-10-CM

## 2016-05-08 DIAGNOSIS — R7303 Prediabetes: Secondary | ICD-10-CM | POA: Diagnosis not present

## 2016-05-08 DIAGNOSIS — E785 Hyperlipidemia, unspecified: Secondary | ICD-10-CM

## 2016-05-08 DIAGNOSIS — E559 Vitamin D deficiency, unspecified: Secondary | ICD-10-CM | POA: Diagnosis not present

## 2016-05-08 DIAGNOSIS — K219 Gastro-esophageal reflux disease without esophagitis: Secondary | ICD-10-CM

## 2016-05-08 DIAGNOSIS — I10 Essential (primary) hypertension: Secondary | ICD-10-CM | POA: Diagnosis not present

## 2016-05-08 LAB — HEPATIC FUNCTION PANEL
ALT: 20 U/L (ref 9–46)
AST: 23 U/L (ref 10–35)
Albumin: 3.8 g/dL (ref 3.6–5.1)
Alkaline Phosphatase: 83 U/L (ref 40–115)
BILIRUBIN DIRECT: 0.1 mg/dL (ref <=0.2)
Indirect Bilirubin: 0.4 mg/dL (ref 0.2–1.2)
Total Bilirubin: 0.5 mg/dL (ref 0.2–1.2)
Total Protein: 6.8 g/dL (ref 6.1–8.1)

## 2016-05-08 LAB — CBC WITH DIFFERENTIAL/PLATELET
BASOS PCT: 2 %
Basophils Absolute: 102 cells/uL (ref 0–200)
Eosinophils Absolute: 459 cells/uL (ref 15–500)
Eosinophils Relative: 9 %
HEMATOCRIT: 43.9 % (ref 38.5–50.0)
HEMOGLOBIN: 14.1 g/dL (ref 13.2–17.1)
LYMPHS ABS: 1479 {cells}/uL (ref 850–3900)
Lymphocytes Relative: 29 %
MCH: 29.3 pg (ref 27.0–33.0)
MCHC: 32.1 g/dL (ref 32.0–36.0)
MCV: 91.3 fL (ref 80.0–100.0)
MONO ABS: 561 {cells}/uL (ref 200–950)
MPV: 10.7 fL (ref 7.5–12.5)
Monocytes Relative: 11 %
NEUTROS PCT: 49 %
Neutro Abs: 2499 cells/uL (ref 1500–7800)
Platelets: 222 10*3/uL (ref 140–400)
RBC: 4.81 MIL/uL (ref 4.20–5.80)
RDW: 14.4 % (ref 11.0–15.0)
WBC: 5.1 10*3/uL (ref 3.8–10.8)

## 2016-05-08 LAB — HEMOGLOBIN A1C
Hgb A1c MFr Bld: 5.5 % (ref <5.7)
MEAN PLASMA GLUCOSE: 111 mg/dL

## 2016-05-08 LAB — BASIC METABOLIC PANEL WITH GFR
BUN: 22 mg/dL (ref 7–25)
CO2: 28 mmol/L (ref 20–31)
Calcium: 8.9 mg/dL (ref 8.6–10.3)
Chloride: 106 mmol/L (ref 98–110)
Creat: 1.13 mg/dL (ref 0.70–1.18)
GFR, EST NON AFRICAN AMERICAN: 63 mL/min (ref >=60)
GFR, Est African American: 73 mL/min (ref >=60)
GLUCOSE: 84 mg/dL (ref 65–99)
POTASSIUM: 4.5 mmol/L (ref 3.5–5.3)
Sodium: 142 mmol/L (ref 135–146)

## 2016-05-08 LAB — TSH: TSH: 1.29 mIU/L (ref 0.40–4.50)

## 2016-05-08 LAB — LIPID PANEL
CHOLESTEROL: 175 mg/dL (ref 125–200)
HDL: 59 mg/dL (ref >=40)
LDL CALC: 101 mg/dL (ref <130)
TRIGLYCERIDES: 76 mg/dL (ref <150)
Total CHOL/HDL Ratio: 3 Ratio (ref <=5.0)
VLDL: 15 mg/dL (ref <30)

## 2016-05-08 LAB — MAGNESIUM: MAGNESIUM: 2 mg/dL (ref 1.5–2.5)

## 2016-05-08 MED ORDER — CETIRIZINE HCL 10 MG PO TABS: ORAL_TABLET | ORAL | Status: DC

## 2016-05-08 NOTE — Patient Instructions (Signed)

## 2016-05-08 NOTE — Progress Notes (Signed)
Patient ID: Jon [name], male   DOB: [birth year], 75 y.o.   MRN: [id number]   [facility] AD[facility] & ADOLESC[facility] MEDICINE                       Unk Pinto, M.D.        [name]. [name], P.A.-C       Starlyn Skeans, P.A.-C   [facility]                [address] [place], [place] [id number] Telephone [phone] Telefax [phone] ______________________________________________________________________     This very nice 75 y.o. Married [place] man  presents for 6 month follow up with Hypertension, Hyperlipidemia, Pre-Diabetes and Vitamin D Deficiency.  Patient also has hx/o GERD controlled with prudent diet.      Patient is treated for HTN circa 1996 & BP has been controlled at home. Today's BP: 124/80 mmHg. Patient has had no complaints of any cardiac type chest pain, palpitations, dyspnea/orthopnea/PND, dizziness, claudication, or dependent edema.     Hyperlipidemia is near  controlled with diet & meds. Patient denies myalgias or other med SE's. Last Lipids were near goal with Cholesterol 183; HDL 57;sl elevated LDL 107; Triglycerides 95 on  02/07/2016.     Also, the patient has history of PreDiabetes since 2013 with A1c 6.3%  and has had no symptoms of reactive hypoglycemia, diabetic polys, paresthesias or visual blurring.  Last A1c was 5.9% on 02/07/2016.       Further, the patient also has history of Vitamin D Deficiency of "24" in 2008 and supplements vitamin D without any suspected side-effects. Last vitamin D was at goal with level 81 on 10/25/2015.  Medication Sig  . atorvastatin  40 MG tablet Take 40 mg by mouth daily. Taking 10 mg  . VITAMIN D 16109 UNITS  Take 1 capsule (50,000 Units total) by mouth daily. For severe vitamin D def  . ADVAIR DISKUS 250-50  Inhale 1 puff into the lungs 2 (two) times daily.  . COMBIVENT RESPIMAT  Inhale 1 puff into the lungs every 4 (four) hours as needed for wheezing.  Marland Kitchen losartan 50 MG  tablet TAKE 1 TABLET DAILY FOR BLOOD PRESSURE  . verapamil-SR 240 MG TAKE 1 TABLET DAILY WITH FOOD  . ZETIA 10 MG tablet TAKE 1 TABLET DAILY  . cetirizine  10 MG tablet TAKE 1 TABLET DAILY FOR ALLERGIES   Allergies  Allergen Reactions  . Mavik [Trandolapril]     Cough  . Hctz [Hydrochlorothiazide] Rash    Photosensitivity   PMHx:   Past Medical History  Diagnosis Date  . Hyperlipidemia   . Hypertension   . Prediabetes   . Vitamin D deficiency   . Asthma   . GERD (gastroesophageal reflux disease)   . Allergy   . History of colon cancer   . BPH (benign prostatic hyperplasia)    Immunization History  Administered Date(s) Administered  . DT 07/10/2009  . Influenza Split 07/28/2013  . Influenza, High Dose Seasonal PF 09/18/2014, 07/26/2015  . Pneumococcal Conjugate-13 02/07/2016  . Pneumococcal Polysaccharide-23 02/22/2007   Past Surgical History  Procedure Laterality Date  . Colon surgery  2004    Dr Rosana Hoes   FHx:    Reviewed / unchanged  SHx:    Reviewed / unchanged  Systems Review:  Constitutional: Denies fever, chills, wt changes, headaches, insomnia,  fatigue, night sweats, change in appetite. Eyes: Denies redness, blurred vision, diplopia, discharge, itchy, watery eyes.  ENT: Denies discharge, congestion, post nasal drip, epistaxis, sore throat, earache, hearing loss, dental pain, tinnitus, vertigo, sinus pain, snoring.  CV: Denies chest pain, palpitations, irregular heartbeat, syncope, dyspnea, diaphoresis, orthopnea, PND, claudication or edema. Respiratory: denies cough, dyspnea, DOE, pleurisy, hoarseness, laryngitis, wheezing.  Gastrointestinal: Denies dysphagia, odynophagia, heartburn, reflux, water brash, abdominal pain or cramps, nausea, vomiting, bloating, diarrhea, constipation, hematemesis, melena, hematochezia  or hemorrhoids. Genitourinary: Denies dysuria, frequency, urgency, nocturia, hesitancy, discharge, hematuria or flank pain. Musculoskeletal: Denies  arthralgias, myalgias, stiffness, jt. swelling, pain, limping or strain/sprain.  Skin: Denies pruritus, rash, hives, warts, acne, eczema or change in skin lesion(s). Neuro: No weakness, tremor, incoordination, spasms, paresthesia or pain. Psychiatric: Denies confusion, memory loss or sensory loss. Endo: Denies change in weight, skin or hair change.  Heme/Lymph: No excessive bleeding, bruising or enlarged lymph nodes.  Physical Exam  BP 124/80 mmHg  Pulse 72  Temp(Src) 97.5 F (36.4 C)  Resp 16  Ht 5\' 7"  (1.702 m)  Wt 172 lb (78.019 kg)  BMI 26.93 kg/m2  Appears well nourished and in no distress. Eyes: PERRLA, EOMs, conjunctiva no swelling or erythema. Sinuses: No frontal/maxillary tenderness ENT/Mouth: EAC's clear, TM's nl w/o erythema, bulging. Nares clear w/o erythema, swelling, exudates. Oropharynx clear without erythema or exudates. Oral hygiene is good. Tongue normal, non obstructing. Hearing intact.  Neck: Supple. Thyroid nl. Car 2+/2+ without bruits, nodes or JVD. Chest: Respirations nl with BS clear & equal w/o rales, rhonchi, wheezing or stridor.  Cor: Heart sounds normal w/ regular rate and rhythm without sig. murmurs, gallops, clicks, or rubs. Peripheral pulses normal and equal  without edema.  Abdomen: Soft & bowel sounds normal. Non-tender w/o guarding, rebound, hernias, masses, or organomegaly.  Lymphatics: Unremarkable.  Musculoskeletal: Full ROM all peripheral extremities, joint stability, 5/5 strength, and normal gait.  Skin: Warm, dry without exposed rashes, lesions or ecchymosis apparent.  Neuro: Cranial nerves intact, reflexes equal bilaterally. Sensory-motor testing grossly intact. Tendon reflexes grossly intact.  Pysch: Alert & oriented x 3.  Insight and judgement nl & appropriate. No ideations.  Assessment and Plan:  1. Essential hypertension  -Continue medication, monitor blood pressure at home. Continue DASH diet. Reminder to go to the ER if any CP, SOB,  nausea, dizziness, severe HA, changes vision/speech, left arm numbness and tingling and jaw pain. - TSH  2. Hyperlipidemia  - Continue diet/meds, exercise,& lifestyle modifications. Continue monitor periodic cholesterol/liver & renal functions  - Lipid panel - TSH  3. Prediabetes  - Continue diet, exercise, lifestyle modifications. Monitor appropriate labs. - Hemoglobin A1c - Insulin, random  4. Vitamin D deficiency  - Continue supplementation. - VITAMIN D 25 Hydroxy   5. Gastroesophageal reflux disease   6. Medication management  - CBC with Differential/Platelet - BASIC METABOLIC PANEL WITH GFR - Hepatic function panel - Magnesium   Recommended regular exercise, BP monitoring, weight control, and discussed med and SE's. Recommended labs to assess and monitor clinical status. Further disposition pending results of labs. Over 30 minutes of exam, counseling, chart review was performed

## 2016-05-09 LAB — INSULIN, RANDOM: INSULIN: 4.2 u[IU]/mL (ref 2.0–19.6)

## 2016-05-09 LAB — VITAMIN D 25 HYDROXY (VIT D DEFICIENCY, FRACTURES): VIT D 25 HYDROXY: 97 ng/mL (ref 30–100)

## 2016-08-18 ENCOUNTER — Encounter: Payer: Self-pay | Admitting: Physician Assistant

## 2016-08-18 ENCOUNTER — Ambulatory Visit (INDEPENDENT_AMBULATORY_CARE_PROVIDER_SITE_OTHER): Payer: Medicare PPO | Admitting: Physician Assistant

## 2016-08-18 VITALS — BP 118/76 | HR 78 | Temp 97.7°F | Resp 16 | Ht 67.0 in | Wt 174.0 lb

## 2016-08-18 DIAGNOSIS — Z23 Encounter for immunization: Secondary | ICD-10-CM

## 2016-08-18 DIAGNOSIS — I10 Essential (primary) hypertension: Secondary | ICD-10-CM

## 2016-08-18 DIAGNOSIS — R7303 Prediabetes: Secondary | ICD-10-CM | POA: Diagnosis not present

## 2016-08-18 DIAGNOSIS — E785 Hyperlipidemia, unspecified: Secondary | ICD-10-CM

## 2016-08-18 DIAGNOSIS — E559 Vitamin D deficiency, unspecified: Secondary | ICD-10-CM | POA: Diagnosis not present

## 2016-08-18 DIAGNOSIS — Z79899 Other long term (current) drug therapy: Secondary | ICD-10-CM

## 2016-08-18 LAB — CBC WITH DIFFERENTIAL/PLATELET
BASOS ABS: 62 {cells}/uL (ref 0–200)
Basophils Relative: 1 %
EOS ABS: 434 {cells}/uL (ref 15–500)
Eosinophils Relative: 7 %
HEMATOCRIT: 45.2 % (ref 38.5–50.0)
HEMOGLOBIN: 14.7 g/dL (ref 13.2–17.1)
LYMPHS ABS: 1488 {cells}/uL (ref 850–3900)
Lymphocytes Relative: 24 %
MCH: 29.6 pg (ref 27.0–33.0)
MCHC: 32.5 g/dL (ref 32.0–36.0)
MCV: 90.9 fL (ref 80.0–100.0)
MONO ABS: 620 {cells}/uL (ref 200–950)
MPV: 10.9 fL (ref 7.5–12.5)
Monocytes Relative: 10 %
NEUTROS ABS: 3596 {cells}/uL (ref 1500–7800)
NEUTROS PCT: 58 %
Platelets: 246 10*3/uL (ref 140–400)
RBC: 4.97 MIL/uL (ref 4.20–5.80)
RDW: 14.8 % (ref 11.0–15.0)
WBC: 6.2 10*3/uL (ref 3.8–10.8)

## 2016-08-18 LAB — TSH: TSH: 1.07 m[IU]/L (ref 0.40–4.50)

## 2016-08-18 NOTE — Progress Notes (Signed)
Assessment and Plan:  Hypertension:  -Continue medication,  -monitor blood pressure at home.  -Continue DASH diet.   -Reminder to go to the ER if any CP, SOB, nausea, dizziness, severe HA, changes vision/speech, left arm numbness and tingling, and jaw pain.  Cholesterol: -Continue diet and exercise.  -Check cholesterol.   Vitamin D Def: -check level PRN -continue medications.   Cerumen impaction - stop using Qtips, irrigation used in the office without complications, use OTC drops/oil at home to prevent reoccurence  Continue diet and meds as discussed. Further disposition pending results of labs.  HPI 75 y.o. male  presents for 3 month follow up with hypertension, hyperlipidemia, prediabetes and vitamin D.   His blood pressure has been controlled at home, today their BP is BP: 118/76.   He does workout, has reduced running to 10 miles and works around the yard and will go to gym. He denies chest pain, shortness of breath, dizziness.  He is on cholesterol medication and denies myalgias. His cholesterol is at goal. The cholesterol last visit was:   Lab Results  Component Value Date   CHOL 175 05/08/2016   HDL 59 05/08/2016   LDLCALC 101 05/08/2016   TRIG 76 05/08/2016   CHOLHDL 3.0 05/08/2016    He has been working on diet and exercise for prediabetes, and denies foot ulcerations, hyperglycemia, hypoglycemia , increased appetite, nausea, paresthesia of the feet, polydipsia, polyuria, visual disturbances, vomiting and weight loss. Last A1C in the office was:  Lab Results  Component Value Date   HGBA1C 5.5 05/08/2016   Patient is on Vitamin D supplement, 50,000 IU 2 x a week.   Lab Results  Component Value Date   VD25OH 97 05/08/2016       Current Medications:  Current Outpatient Prescriptions on File Prior to Visit  Medication Sig Dispense Refill  . atorvastatin (LIPITOR) 40 MG tablet Take 40 mg by mouth daily. Taking 10 mg    . cetirizine (ZYRTEC) 10 MG tablet TAKE 1  TABLET DAILY FOR ALLERGIES 90 tablet 4  . ergocalciferol (VITAMIN D2) 50000 UNITS capsule Take 1 capsule (50,000 Units total) by mouth daily. For severe vitamin D def 90 capsule 2  . Fluticasone-Salmeterol (ADVAIR DISKUS) 250-50 MCG/DOSE AEPB Inhale 1 puff into the lungs 2 (two) times daily. 60 each 4  . Ipratropium-Albuterol (COMBIVENT RESPIMAT) 20-100 MCG/ACT AERS respimat Inhale 1 puff into the lungs every 4 (four) hours as needed for wheezing. 4 g 4  . losartan (COZAAR) 50 MG tablet TAKE 1 TABLET DAILY FOR BLOOD PRESSURE 90 tablet 1  . verapamil (CALAN-SR) 240 MG CR tablet TAKE 1 TABLET DAILY WITH FOOD 90 tablet 48  . ZETIA 10 MG tablet TAKE 1 TABLET DAILY 90 tablet 1   No current facility-administered medications on file prior to visit.     Medical History:  Past Medical History:  Diagnosis Date  . Allergy   . Asthma   . BPH (benign prostatic hyperplasia)   . GERD (gastroesophageal reflux disease)   . History of colon cancer   . Hyperlipidemia   . Hypertension   . Prediabetes   . Vitamin D deficiency     Allergies:  Allergies  Allergen Reactions  . Mavik [Trandolapril]     Cough  . Hctz [Hydrochlorothiazide] Rash    Photosensitivity     Review of Systems:  Review of Systems  Constitutional: Negative for chills, fever and malaise/fatigue.  HENT: Negative for congestion, ear pain and sore throat.  Eyes: Negative.   Respiratory: Negative for cough, shortness of breath and wheezing.   Cardiovascular: Negative for chest pain, palpitations and leg swelling.  Gastrointestinal: Negative for abdominal pain, blood in stool, constipation, diarrhea, heartburn and melena.  Genitourinary: Negative.   Skin: Negative.   Neurological: Negative for dizziness, sensory change, loss of consciousness and headaches.  Psychiatric/Behavioral: Negative for depression. The patient is not nervous/anxious and does not have insomnia.     Family history- Review and unchanged  Social history-  Review and unchanged  Physical Exam: BP 118/76   Pulse 78   Temp 97.7 F (36.5 C)   Resp 16   Ht 5\' 7"  (1.702 m)   Wt 174 lb (78.9 kg)   SpO2 97%   BMI 27.25 kg/m  Wt Readings from Last 3 Encounters:  08/18/16 174 lb (78.9 kg)  05/08/16 172 lb (78 kg)  02/07/16 172 lb (78 kg)    General Appearance: Well nourished well developed, in no apparent distress. Eyes: PERRLA, EOMs, conjunctiva no swelling or erythema ENT/Mouth: Ear canals normal without obstruction, swelling, erythma, discharge.  TMs normal bilaterally.  Oropharynx moist, clear, without exudate, or postoropharyngeal swelling. Neck: Supple, thyroid normal,no cervical adenopathy  Respiratory: Respiratory effort normal, Breath sounds clear A&P without rhonchi, wheeze, or rale.  No retractions, no accessory usage. Cardio: RRR with no MRGs. Brisk peripheral pulses without edema.  Abdomen: Soft, + BS,  Non tender, no guarding, rebound, hernias, masses. Musculoskeletal: Full ROM, 5/5 strength, Normal gait Skin: Warm, dry without rashes, lesions, ecchymosis.  Neuro: Awake and oriented X 3, Cranial nerves intact. Normal muscle tone, no cerebellar symptoms. Psych: Normal affect, Insight and Judgment appropriate.    Vicie Mutters, PA-C 8:34 AM [facility]

## 2016-08-18 NOTE — Patient Instructions (Signed)
Use a dropper or use a cap to put olive oil,mineral oil or canola oil in the effected ear- 2-3 times a week. Let it soak for 20-30 min then you can take a shower or use a baby bulb with warm water to wash out the ear wax.  Do not use Qtips   

## 2016-08-19 LAB — MAGNESIUM: MAGNESIUM: 2.1 mg/dL (ref 1.5–2.5)

## 2016-08-19 LAB — HEPATIC FUNCTION PANEL
ALT: 17 U/L (ref 9–46)
AST: 19 U/L (ref 10–35)
Albumin: 3.8 g/dL (ref 3.6–5.1)
Alkaline Phosphatase: 94 U/L (ref 40–115)
BILIRUBIN DIRECT: 0.1 mg/dL (ref <=0.2)
Indirect Bilirubin: 0.4 mg/dL (ref 0.2–1.2)
TOTAL PROTEIN: 7.1 g/dL (ref 6.1–8.1)
Total Bilirubin: 0.5 mg/dL (ref 0.2–1.2)

## 2016-08-19 LAB — BASIC METABOLIC PANEL WITH GFR
BUN: 17 mg/dL (ref 7–25)
CALCIUM: 9.2 mg/dL (ref 8.6–10.3)
CHLORIDE: 103 mmol/L (ref 98–110)
CO2: 25 mmol/L (ref 20–31)
CREATININE: 0.99 mg/dL (ref 0.70–1.18)
GFR, Est African American: 86 mL/min (ref >=60)
GFR, Est Non African American: 74 mL/min (ref >=60)
GLUCOSE: 87 mg/dL (ref 65–99)
Potassium: 4.4 mmol/L (ref 3.5–5.3)
SODIUM: 141 mmol/L (ref 135–146)

## 2016-08-19 LAB — VITAMIN D 25 HYDROXY (VIT D DEFICIENCY, FRACTURES): VIT D 25 HYDROXY: 90 ng/mL (ref 30–100)

## 2016-08-19 LAB — LIPID PANEL
Cholesterol: 177 mg/dL (ref 125–200)
HDL: 61 mg/dL (ref >=40)
LDL CALC: 98 mg/dL (ref <130)
Total CHOL/HDL Ratio: 2.9 Ratio (ref <=5.0)
Triglycerides: 88 mg/dL (ref <150)
VLDL: 18 mg/dL (ref <30)

## 2016-09-01 ENCOUNTER — Other Ambulatory Visit: Payer: Self-pay | Admitting: Internal Medicine

## 2016-09-26 ENCOUNTER — Other Ambulatory Visit: Payer: Self-pay | Admitting: Internal Medicine

## 2016-09-29 ENCOUNTER — Encounter: Payer: Self-pay | Admitting: Physician Assistant

## 2016-09-29 ENCOUNTER — Ambulatory Visit (INDEPENDENT_AMBULATORY_CARE_PROVIDER_SITE_OTHER): Payer: Medicare PPO | Admitting: Physician Assistant

## 2016-09-29 VITALS — BP 124/66 | HR 82 | Temp 98.6°F | Resp 18 | Ht 67.0 in | Wt 174.0 lb

## 2016-09-29 DIAGNOSIS — J069 Acute upper respiratory infection, unspecified: Secondary | ICD-10-CM

## 2016-09-29 MED ORDER — PREDNISONE 20 MG PO TABS: ORAL_TABLET | ORAL | 0 refills | Status: DC

## 2016-09-29 MED ORDER — AZITHROMYCIN 250 MG PO TABS: ORAL_TABLET | ORAL | 1 refills | Status: AC

## 2016-09-29 NOTE — Progress Notes (Signed)
Subjective:    Patient ID: Jon [name], male    DOB: 05-25-1941, 75 y.o.   MRN: [id number]  HPI 75 y.o. M with history of asthma presents with cold symptoms x 2 weeks. He has been using his advair and combivent, has been on cold and flu OTC meds. Has cough, wheezing, SOB, sinus pain.   Blood pressure 124/66, pulse 82, temperature 98.6 F (37 C), temperature source Temporal, resp. rate 18, height 5\' 7"  (1.702 m), weight 174 lb (78.9 kg), SpO2 93 %.  Medications Current Outpatient Prescriptions on File Prior to Visit  Medication Sig  . atorvastatin (LIPITOR) 40 MG tablet Take 40 mg by mouth daily. Taking 10 mg  . cetirizine (ZYRTEC) 10 MG tablet TAKE 1 TABLET DAILY FOR ALLERGIES  . ezetimibe (ZETIA) 10 MG tablet TAKE 1 TABLET DAILY  . Fluticasone-Salmeterol (ADVAIR DISKUS) 250-50 MCG/DOSE AEPB Inhale 1 puff into the lungs 2 (two) times daily.  . Ipratropium-Albuterol (COMBIVENT RESPIMAT) 20-100 MCG/ACT AERS respimat Inhale 1 puff into the lungs every 4 (four) hours as needed for wheezing.  Marland Kitchen losartan (COZAAR) 50 MG tablet TAKE 1 TABLET DAILY FOR BLOOD PRESSURE  . verapamil (CALAN-SR) 240 MG CR tablet TAKE 1 TABLET DAILY WITH FOOD  . Vitamin D, Ergocalciferol, (DRISDOL) 50000 units CAPS capsule TAKE 1 CAPSULE DAILY FOR SEVERE VITAMIN D DEFICIENCY   No current facility-administered medications on file prior to visit.     Problem list He has Hyperlipidemia; Hypertension; Vitamin D deficiency; Prediabetes; Asthma; GERD (gastroesophageal reflux disease); History of colon cancer; BPH (benign prostatic hyperplasia); and Medication management on his problem list.  Review of Systems  Constitutional: Negative for chills, diaphoresis and fever.  HENT: Positive for congestion, postnasal drip, rhinorrhea, sinus pressure and sore throat. Negative for ear pain, sneezing, trouble swallowing and voice change.   Eyes: Negative.   Respiratory: Positive for cough, shortness of breath and wheezing.  Negative for chest tightness.   Cardiovascular: Negative.   Gastrointestinal: Negative.   Genitourinary: Negative.   Musculoskeletal: Negative.  Negative for neck pain.  Neurological: Negative.  Negative for headaches.       Objective:   Physical Exam  Constitutional: He is oriented to person, place, and time. He appears well-developed and well-nourished.  HENT:  Head: Normocephalic and atraumatic.  Right Ear: External ear normal.  Left Ear: External ear normal.  Nose: Nose normal.  Mouth/Throat: Oropharynx is clear and moist.  Eyes: Conjunctivae are normal. Pupils are equal, round, and reactive to light.  Neck: Normal range of motion. Neck supple.  Cardiovascular: Normal rate, regular rhythm and normal heart sounds.   No murmur heard. Pulmonary/Chest: Effort normal. No respiratory distress. He has wheezes (worse right lower lung). He has no rales. He exhibits no tenderness.  Abdominal: Soft. Bowel sounds are normal.  Lymphadenopathy:    He has no cervical adenopathy.  Neurological: He is alert and oriented to person, place, and time.  Skin: Skin is warm and dry.      Assessment & Plan:  1. Acute URI Will hold the zpak and take if she is not getting better, increase fluids, rest, cont allergy pill - azithromycin (ZITHROMAX) 250 MG tablet; Take 2 tablets (500 mg) on  Day 1,  followed by 1 tablet (250 mg) once daily on Days 2 through 5.  Dispense: 6 each; Refill: 1 - predniSONE (DELTASONE) 20 MG tablet; 2 tablets daily for 3 days, 1 tablet daily for 4 days.  Dispense: 10 tablet; Refill: 0  Future Appointments  Date Time Provider [place]  11/27/2016 9:00 AM Unk Pinto, MD [facility] None

## 2016-09-29 NOTE — Patient Instructions (Signed)
Please take the prednisone to help decrease inflammation and therefore decrease symptoms. Take it it with food to avoid GI upset. It can cause increased energy but on the other hand it can make it hard to sleep at night so please take it AT [place], it takes 8-12 hours to start working so it will NOT affect your sleeping if you take it at night with your food!!  If you are diabetic it will increase your sugars so decrease carbs and monitor your sugars closely.    This is NOT an antibiotic, you can stop it at [place]:  -Symptoms usually last at least 1 week with the worst symptoms being around day 4.  - colds usually start with a sore throat and end with a cough, and the cough can take 2 weeks to get better.  -No antibiotics are needed for colds, flu, sore throats, cough, bronchitis UNLESS symptoms are longer than 7 days OR if you are getting better then get drastically worse.  -There are a lot of combination medications (Dayquil, Nyquil, Vicks 44, tyelnol cold and sinus, ETC). Please look at the ingredients on the back so that you are treating the correct symptoms and not doubling up on medications/ingredients.    Medicines you can use  Nasal congestion  - pseudoephedrine (Sudafed)- behind the counter, do not use if you have high blood pressure, medicine that have -D in them.  - phenylephrine (Sudafed PE) -Dextormethorphan + chlorpheniramine (Coridcidin HBP)- okay if you have high blood pressure -Oxymetazoline (Afrin) nasal spray- LIMIT to 3 days -Saline nasal spray -Neti pot (used distilled or bottled water)  Ear pain/congestion  -pseudoephedrine (sudafed) - Nasonex/flonase nasal spray  Fever  -Acetaminophen (Tyelnol) -Ibuprofen (Advil, motrin, aleve)  Sore Throat  -Acetaminophen (Tyelnol) -Ibuprofen (Advil, motrin, aleve) -Drink a lot of water -Gargle with salt water - Rest your voice (don't talk) -Throat sprays -Cough  drops  Body Aches  -Acetaminophen (Tyelnol) -Ibuprofen (Advil, motrin, aleve)  Headache  -Acetaminophen (Tyelnol) -Ibuprofen (Advil, motrin, aleve) - Exedrin, Exedrin Migraine  Allergy symptoms (cough, sneeze, runny nose, itchy eyes) -Claritin or loratadine cheapest but likely the weakest  -Zyrtec or certizine at night because it can make you sleepy -The strongest is allegra or fexafinadine  Cheapest at [employer], [employer], [employer]  Cough  -Dextromethorphan (Delsym)- medicine that has DM in it -Guafenesin (Mucinex/Robitussin) - cough drops - drink lots of water  Chest Congestion  -Guafenesin (Mucinex/Robitussin)  Red Itchy Eyes  - Naphcon-A  Upset Stomach  - Bland diet (nothing spicy, greasy, fried, and high acid foods like tomatoes, oranges, berries) -OKAY- cereal, bread, soup, crackers, rice -Eat smaller more frequent meals -reduce caffeine, no alcohol -Loperamide (Imodium-AD) if diarrhea -Prevacid for heart burn  General health when sick  -Hydration -wash your hands frequently -keep surfaces clean -change pillow cases and sheets often -Get fresh air but do not exercise strenuously -Vitamin D, double up on it - Vitamin C -Zinc

## 2016-10-23 ENCOUNTER — Other Ambulatory Visit: Payer: Self-pay | Admitting: Internal Medicine

## 2016-10-23 ENCOUNTER — Other Ambulatory Visit: Payer: Self-pay | Admitting: Physician Assistant

## 2016-11-27 ENCOUNTER — Encounter: Payer: Self-pay | Admitting: Internal Medicine

## 2016-11-27 ENCOUNTER — Ambulatory Visit (INDEPENDENT_AMBULATORY_CARE_PROVIDER_SITE_OTHER): Payer: Medicare PPO | Admitting: Internal Medicine

## 2016-11-27 VITALS — BP 136/88 | HR 76 | Temp 97.0°F | Resp 16 | Ht 68.0 in | Wt 173.8 lb

## 2016-11-27 DIAGNOSIS — K219 Gastro-esophageal reflux disease without esophagitis: Secondary | ICD-10-CM

## 2016-11-27 DIAGNOSIS — E559 Vitamin D deficiency, unspecified: Secondary | ICD-10-CM

## 2016-11-27 DIAGNOSIS — R7303 Prediabetes: Secondary | ICD-10-CM | POA: Diagnosis not present

## 2016-11-27 DIAGNOSIS — Z1212 Encounter for screening for malignant neoplasm of rectum: Secondary | ICD-10-CM

## 2016-11-27 DIAGNOSIS — Z125 Encounter for screening for malignant neoplasm of prostate: Secondary | ICD-10-CM | POA: Diagnosis not present

## 2016-11-27 DIAGNOSIS — N320 Bladder-neck obstruction: Secondary | ICD-10-CM

## 2016-11-27 DIAGNOSIS — E782 Mixed hyperlipidemia: Secondary | ICD-10-CM

## 2016-11-27 DIAGNOSIS — Z136 Encounter for screening for cardiovascular disorders: Secondary | ICD-10-CM

## 2016-11-27 DIAGNOSIS — Z79899 Other long term (current) drug therapy: Secondary | ICD-10-CM

## 2016-11-27 DIAGNOSIS — Z0001 Encounter for general adult medical examination with abnormal findings: Secondary | ICD-10-CM

## 2016-11-27 DIAGNOSIS — Z85038 Personal history of other malignant neoplasm of large intestine: Secondary | ICD-10-CM

## 2016-11-27 DIAGNOSIS — I10 Essential (primary) hypertension: Secondary | ICD-10-CM | POA: Diagnosis not present

## 2016-11-27 DIAGNOSIS — Z0000 Encounter for general adult medical examination without abnormal findings: Secondary | ICD-10-CM

## 2016-11-27 LAB — CBC WITH DIFFERENTIAL/PLATELET
Basophils Absolute: 100 cells/uL (ref 0–200)
Basophils Relative: 2 %
Eosinophils Absolute: 300 cells/uL (ref 15–500)
Eosinophils Relative: 6 %
HCT: 43 % (ref 38.5–50.0)
HEMOGLOBIN: 14.1 g/dL (ref 13.2–17.1)
LYMPHS ABS: 1250 {cells}/uL (ref 850–3900)
Lymphocytes Relative: 25 %
MCH: 29.9 pg (ref 27.0–33.0)
MCHC: 32.8 g/dL (ref 32.0–36.0)
MCV: 91.1 fL (ref 80.0–100.0)
MPV: 10.4 fL (ref 7.5–12.5)
Monocytes Absolute: 550 cells/uL (ref 200–950)
Monocytes Relative: 11 %
NEUTROS ABS: 2800 {cells}/uL (ref 1500–7800)
Neutrophils Relative %: 56 %
PLATELETS: 227 10*3/uL (ref 140–400)
RBC: 4.72 MIL/uL (ref 4.20–5.80)
RDW: 14.5 % (ref 11.0–15.0)
WBC: 5 10*3/uL (ref 3.8–10.8)

## 2016-11-27 LAB — TSH: TSH: 1.38 mIU/L (ref 0.40–4.50)

## 2016-11-27 NOTE — Patient Instructions (Signed)

## 2016-11-27 NOTE — Progress Notes (Signed)
[facility]   Jon [name], M.D.    Jon [name], P.A.-C      Jon [name], P.A.-C  Jon [name]                [address] [place], [place] [id number] Telephone [phone] Telefax [phone] Annual  Screening/Preventative Visit  & Comprehensive Evaluation & Examination     This very nice 76 y.o. Married Filipino Man presents for a Screening/Preventative Visit & comprehensive evaluation and management of multiple medical co-morbidities.  Patient has been followed for HTN, Prediabetes, Hyperlipidemia and Vitamin D Deficiency. Patient has hx/o Colon Ca in 2004 and most recent Colonoscopy was in 2016.      HTN predates since 1996. Patient's BP has been controlled at home.  Today's BP is slightly elevated at 136/88. Patient denies any cardiac symptoms as chest pain, palpitations, shortness of breath, dizziness or ankle swelling.     Patient's hyperlipidemia is controlled with diet and medications. Patient denies myalgias or other medication SE's. Last lipids were at goal: Lab Results  Component Value Date   CHOL 177 08/18/2016   HDL 61 08/18/2016   LDLCALC 98 08/18/2016   TRIG 88 08/18/2016   CHOLHDL 2.9 08/18/2016      Patient has prediabetes since with A1c 6.3% in 2013 and patient denies reactive hypoglycemic symptoms, visual blurring, diabetic polys or paresthesias. Last A1c was at goal:  Lab Results  Component Value Date   HGBA1C 5.5 05/08/2016       Finally, patient has history of Vitamin D Deficiency in 2008 of "24" and last vitamin D was at goal: Lab Results  Component Value Date   VD25OH 90 08/18/2016   Current Outpatient Prescriptions on File Prior to Visit  Medication Sig  . atorvastatin  40 MG  Take 40 mg daily. Taking 1/4 tab = 10 mg / day  . cetirizine  10 MG  TAKE 1 TAB DAILY FOR ALLERGIES  . ezetimibe  10 MG t TAKE 1 TAB DAILY  . ADVAIR DISKUS 250 Inhale 1 puff  2   times daily.  . COMBIVENT RESPIMAT 1 puff into the lungs every 4  hrs as needed   . losartan  50 MG TAKE 1 TAB DAILY   . verapamil-SR 240 MG CR  TAKE 1 TAB DAILY WITH FOOD  . Vitamin D 50, 000 units  TAKE 1 CAP DAILY - takes 2 x/week   Allergies  Allergen Reactions  . Mavik [Trandolapril]     Cough  . Hctz [Hydrochlorothiazide] Rash    Photosensitivity   Past Medical History:  Diagnosis Date  . Allergy   . Asthma   . BPH (benign prostatic hyperplasia)   . GERD (gastroesophageal reflux disease)   . History of colon cancer   . Hyperlipidemia   . Hypertension   . Prediabetes   . Vitamin D deficiency    Health Maintenance  Topic Date Due  . ZOSTAVAX  04/26/2001  . TETANUS/TDAP  04/20/2019  . COLONOSCOPY  03/17/2026  . INFLUENZA VACCINE  Completed  . PNA vac Low Risk Adult  Completed   Immunization History  Administered Date(s) Administered  . DT 07/10/2009  . Influenza Split 07/28/2013  . Influenza, High Dose Seasonal PF 09/18/2014, 07/26/2015, 08/18/2016  . Pneumococcal Conjugate-13 02/07/2016  . Pneumococcal Polysaccharide-23 02/22/2007   Past Surgical History:  Procedure Laterality Date  .  COLON SURGERY  2004   Dr Rosana Hoes   Family History  Problem Relation Age of Onset  . Heart disease Mother   . Cancer Father   . Cancer Sister   . Autoimmune disease Brother    Social History   Social History  . Marital status: Married    Spouse name: N/A  . Number of children: N/A  . Years of education: N/A   Occupational History  . [employer]ied [employer] and has worked part-time as a Dentist   Social History Main Topics  . Smoking status: Never Smoker  . Smokeless tobacco: Never Used  . Alcohol use No  . Drug use: No  . Sexual activity: Not on file    ROS Constitutional: Denies fever, chills, weight loss/gain, headaches, insomnia,  night sweats or change in appetite. Does c/o fatigue. Eyes: Denies redness, blurred vision, diplopia, discharge, itchy or watery  eyes.  ENT: Denies discharge, congestion, post nasal drip, epistaxis, sore throat, earache, hearing loss, dental pain, Tinnitus, Vertigo, Sinus pain or snoring.  Cardio: Denies chest pain, palpitations, irregular heartbeat, syncope, dyspnea, diaphoresis, orthopnea, PND, claudication or edema Respiratory: denies cough, dyspnea, DOE, pleurisy, hoarseness, laryngitis or wheezing.  Gastrointestinal: Denies dysphagia, heartburn, reflux, water brash, pain, cramps, nausea, vomiting, bloating, diarrhea, constipation, hematemesis, melena, hematochezia, jaundice or hemorrhoids Genitourinary: Denies dysuria, frequency, urgency, nocturia, hesitancy, discharge, hematuria or flank pain Musculoskeletal: Denies arthralgia, myalgia, stiffness, Jt. Swelling, pain, limp or strain/sprain. Denies Falls. Skin: Denies puritis, rash, hives, warts, acne, eczema or change in skin lesion Neuro: No weakness, tremor, incoordination, spasms, paresthesia or pain Psychiatric: Denies confusion, memory loss or sensory loss. Denies Depression. Endocrine: Denies change in weight, skin, hair change, nocturia, and paresthesia, diabetic polys, visual blurring or hyper / hypo glycemic episodes.  Heme/Lymph: No excessive bleeding, bruising or enlarged lymph nodes.  Physical Exam  BP 136/88   Pulse 76   Temp 97 F (36.1 C)   Resp 16   Ht 5\' 8"  (1.727 m)   Wt 173 lb 12.8 oz (78.8 kg)   BMI 26.43 kg/m   General Appearance: Well nourished, in no apparent distress.  Eyes: PERRLA, EOMs, conjunctiva no swelling or erythema, normal fundi and vessels. Sinuses: No frontal/maxillary tenderness ENT/Mouth: EACs patent / TMs  nl. Nares clear without erythema, swelling, mucoid exudates. Oral hygiene is good. No erythema, swelling, or exudate. Tongue normal, non-obstructing. Tonsils not swollen or erythematous. Hearing normal.  Neck: Supple, thyroid normal. No bruits, nodes or JVD. Respiratory: Respiratory effort normal.  BS equal and clear  bilateral without rales, rhonci, wheezing or stridor. Cardio: Heart sounds are normal with regular rate and rhythm and no murmurs, rubs or gallops. Peripheral pulses are normal and equal bilaterally without edema. No aortic or femoral bruits. Chest: symmetric with normal excursions and percussion.  Abdomen: Soft, with Nl bowel sounds. Nontender, no guarding, rebound, hernias, masses, or organomegaly.  Lymphatics: Non tender without lymphadenopathy.  Genitourinary: No hernias.Testes nl. DRE - prostate nl for age - smooth & firm w/o nodules. Musculoskeletal: Full ROM all peripheral extremities, joint stability, 5/5 strength, and normal gait. Skin: Warm and dry without rashes, lesions, cyanosis, clubbing or  ecchymosis.  Neuro: Cranial nerves intact, reflexes equal bilaterally. Normal muscle tone, no cerebellar symptoms. Sensation intact.  Pysch: Alert and oriented X 3 with normal affect, insight and judgment appropriate.   Assessment and Plan  1. Annual Preventative/Screening Exam        Continue prudent diet as discussed, weight control, BP monitoring, regular  exercise, and medications as discussed.  Discussed med effects and SE's. Routine screening labs and tests as requested with regular follow-up as recommended. Over 40 minutes of exam, counseling, chart review and high complex critical decision making was performed

## 2016-11-28 LAB — URINALYSIS, ROUTINE W REFLEX MICROSCOPIC
Bilirubin Urine: NEGATIVE
Glucose, UA: NEGATIVE
Hgb urine dipstick: NEGATIVE
Ketones, ur: NEGATIVE
LEUKOCYTES UA: NEGATIVE
NITRITE: NEGATIVE
Protein, ur: NEGATIVE
SPECIFIC GRAVITY, URINE: 1.013 (ref 1.001–1.035)
pH: 6.5 (ref 5.0–8.0)

## 2016-11-28 LAB — LIPID PANEL
CHOL/HDL RATIO: 3 ratio (ref <5.0)
Cholesterol: 183 mg/dL (ref <200)
HDL: 62 mg/dL (ref >40)
LDL Cholesterol: 108 mg/dL — ABNORMAL HIGH (ref <100)
Triglycerides: 64 mg/dL (ref <150)
VLDL: 13 mg/dL (ref <30)

## 2016-11-28 LAB — HEPATIC FUNCTION PANEL
ALT: 18 U/L (ref 9–46)
AST: 20 U/L (ref 10–35)
Albumin: 3.8 g/dL (ref 3.6–5.1)
Alkaline Phosphatase: 99 U/L (ref 40–115)
BILIRUBIN DIRECT: 0.1 mg/dL (ref <=0.2)
BILIRUBIN INDIRECT: 0.4 mg/dL (ref 0.2–1.2)
BILIRUBIN TOTAL: 0.5 mg/dL (ref 0.2–1.2)
Total Protein: 6.9 g/dL (ref 6.1–8.1)

## 2016-11-28 LAB — MAGNESIUM: Magnesium: 2.2 mg/dL (ref 1.5–2.5)

## 2016-11-28 LAB — BASIC METABOLIC PANEL WITH GFR
BUN: 20 mg/dL (ref 7–25)
CHLORIDE: 103 mmol/L (ref 98–110)
CO2: 24 mmol/L (ref 20–31)
Calcium: 9.2 mg/dL (ref 8.6–10.3)
Creat: 1.07 mg/dL (ref 0.70–1.18)
GFR, EST NON AFRICAN AMERICAN: 68 mL/min (ref >=60)
GFR, Est African American: 78 mL/min (ref >=60)
Glucose, Bld: 94 mg/dL (ref 65–99)
Potassium: 4.6 mmol/L (ref 3.5–5.3)
Sodium: 141 mmol/L (ref 135–146)

## 2016-11-28 LAB — INSULIN, RANDOM: Insulin: 4 u[IU]/mL (ref 2.0–19.6)

## 2016-11-28 LAB — HEMOGLOBIN A1C
HEMOGLOBIN A1C: 5.6 % (ref <5.7)
Mean Plasma Glucose: 114 mg/dL

## 2016-11-28 LAB — PSA: PSA: 0.4 ng/mL (ref <=4.0)

## 2016-11-28 LAB — MICROALBUMIN / CREATININE URINE RATIO
Creatinine, Urine: 96 mg/dL (ref 20–370)
MICROALB UR: 0.3 mg/dL
MICROALB/CREAT RATIO: 3 ug/mg{creat} (ref <30)

## 2016-11-28 LAB — VITAMIN D 25 HYDROXY (VIT D DEFICIENCY, FRACTURES): Vit D, 25-Hydroxy: 89 ng/mL (ref 30–100)

## 2016-12-30 ENCOUNTER — Other Ambulatory Visit: Payer: Self-pay | Admitting: *Deleted

## 2016-12-30 MED ORDER — EZETIMIBE 10 MG PO TABS
10.0000 mg | ORAL_TABLET | Freq: Every day | ORAL | 1 refills | Status: DC
Start: 2016-12-30 — End: 2017-09-28

## 2017-02-06 ENCOUNTER — Ambulatory Visit (INDEPENDENT_AMBULATORY_CARE_PROVIDER_SITE_OTHER): Payer: Medicare PPO | Admitting: Physician Assistant

## 2017-02-06 ENCOUNTER — Encounter: Payer: Self-pay | Admitting: Physician Assistant

## 2017-02-06 VITALS — BP 140/84 | HR 88 | Temp 100.8°F | Resp 18 | Ht 68.0 in | Wt 174.0 lb

## 2017-02-06 DIAGNOSIS — E782 Mixed hyperlipidemia: Secondary | ICD-10-CM | POA: Diagnosis not present

## 2017-02-06 DIAGNOSIS — I10 Essential (primary) hypertension: Secondary | ICD-10-CM

## 2017-02-06 DIAGNOSIS — J069 Acute upper respiratory infection, unspecified: Secondary | ICD-10-CM

## 2017-02-06 DIAGNOSIS — Z85038 Personal history of other malignant neoplasm of large intestine: Secondary | ICD-10-CM | POA: Diagnosis not present

## 2017-02-06 DIAGNOSIS — R7303 Prediabetes: Secondary | ICD-10-CM

## 2017-02-06 DIAGNOSIS — R6889 Other general symptoms and signs: Secondary | ICD-10-CM

## 2017-02-06 DIAGNOSIS — Z0000 Encounter for general adult medical examination without abnormal findings: Secondary | ICD-10-CM

## 2017-02-06 DIAGNOSIS — N401 Enlarged prostate with lower urinary tract symptoms: Secondary | ICD-10-CM | POA: Diagnosis not present

## 2017-02-06 DIAGNOSIS — K219 Gastro-esophageal reflux disease without esophagitis: Secondary | ICD-10-CM

## 2017-02-06 DIAGNOSIS — E559 Vitamin D deficiency, unspecified: Secondary | ICD-10-CM

## 2017-02-06 DIAGNOSIS — Z79899 Other long term (current) drug therapy: Secondary | ICD-10-CM | POA: Diagnosis not present

## 2017-02-06 DIAGNOSIS — Z0001 Encounter for general adult medical examination with abnormal findings: Secondary | ICD-10-CM | POA: Diagnosis not present

## 2017-02-06 DIAGNOSIS — J45909 Unspecified asthma, uncomplicated: Secondary | ICD-10-CM | POA: Diagnosis not present

## 2017-02-06 DIAGNOSIS — R351 Nocturia: Secondary | ICD-10-CM

## 2017-02-06 LAB — CBC WITH DIFFERENTIAL/PLATELET
Basophils Absolute: 108 {cells}/uL (ref 0–200)
Basophils Relative: 2 %
Eosinophils Absolute: 270 {cells}/uL (ref 15–500)
Eosinophils Relative: 5 %
HCT: 42.9 % (ref 38.5–50.0)
Hemoglobin: 14.4 g/dL (ref 13.2–17.1)
Lymphocytes Relative: 9 %
Lymphs Abs: 486 {cells}/uL — ABNORMAL LOW (ref 850–3900)
MCH: 30 pg (ref 27.0–33.0)
MCHC: 33.6 g/dL (ref 32.0–36.0)
MCV: 89.4 fL (ref 80.0–100.0)
MPV: 10 fL (ref 7.5–12.5)
Monocytes Absolute: 1026 {cells}/uL — ABNORMAL HIGH (ref 200–950)
Monocytes Relative: 19 %
Neutro Abs: 3510 {cells}/uL (ref 1500–7800)
Neutrophils Relative %: 65 %
Platelets: 187 10*3/uL (ref 140–400)
RBC: 4.8 MIL/uL (ref 4.20–5.80)
RDW: 14.2 % (ref 11.0–15.0)
WBC: 5.4 10*3/uL (ref 3.8–10.8)

## 2017-02-06 MED ORDER — PROMETHAZINE-CODEINE 6.25-10 MG/5ML PO SYRP: 5.0000 mL | ORAL_SOLUTION | Freq: Four times a day (QID) | ORAL | 0 refills | Status: DC | PRN

## 2017-02-06 MED ORDER — AZITHROMYCIN 250 MG PO TABS: ORAL_TABLET | ORAL | 1 refills | Status: AC

## 2017-02-06 MED ORDER — PREDNISONE 20 MG PO TABS: ORAL_TABLET | ORAL | 0 refills | Status: DC

## 2017-02-06 MED ORDER — FLUTICASONE PROPIONATE 50 MCG/ACT NA SUSP: 2.0000 | Freq: Every day | NASAL | 1 refills | Status: DC

## 2017-02-06 NOTE — Patient Instructions (Signed)

## 2017-02-06 NOTE — Progress Notes (Signed)
MEDICARE ANNUAL WELLNESS VISIT AND FOLLOW UP Assessment:    Essential hypertension - continue medications, DASH diet, exercise and monitor at home. Call if greater than 130/80.  -     CBC with Differential/Platelet  Mixed hyperlipidemia -continue medications, check lipids, decrease fatty foods, increase activity.   Prediabetes Discussed general issues about diabetes pathophysiology and management., Educational material distributed., Suggested low cholesterol diet., Encouraged aerobic exercise., Discussed foot care., Reminded to get yearly retinal exam.  Vitamin D deficiency Continue supplement  Medication management  Uncomplicated asthma, unspecified asthma severity, unspecified whether persistent Asthma exacerbation, prednisone, continue advair/albuterol  Gastroesophageal reflux disease, esophagitis presence not specified Continue PPI/H2 blocker, diet discussed  Benign prostatic hyperplasia with nocturia Continue to monitor PSA at CPE  History of colon cancer UTD on colonoscopy  Encounter for Medicare annual wellness exam  Acute URI -     CBC with Differential/Platelet -     azithromycin (ZITHROMAX) 250 MG tablet; Take 2 tablets (500 mg) on  Day 1,  followed by 1 tablet (250 mg) once daily on Days 2 through 5. -     predniSONE (DELTASONE) 20 MG tablet; 2 tablets daily for 3 days, 1 tablet daily for 4 days. -     fluticasone (FLONASE) 50 MCG/ACT nasal spray; Place 2 sprays into both nostrils at bedtime. -     promethazine-codeine (PHENERGAN WITH CODEINE) 6.25-10 MG/5ML syrup; Take 5 mLs by mouth every 6 (six) hours as needed for cough. Max: 74mL per day  The patient was advised to call immediately if he has any concerning symptoms in the interval. The patient voices understanding of current treatment options and is in agreement with the current care plan.The patient knows to call the clinic with any problems, questions or concerns or go to the ER if any further progression of  symptoms.   Future Appointments Date Time Provider [place]  03/05/2017 8:45 AM Vicie Mutters, PA-C [facility] None  06/11/2017 9:30 AM Unk Pinto, MD [facility] None  12/28/2017 9:00 AM Unk Pinto, MD [facility] None      Plan:   During the course of the visit the patient was educated and counseled about appropriate screening and preventive services including:    Pneumococcal vaccine   Influenza vaccine  Td vaccine  Screening electrocardiogram  Colorectal cancer screening  Diabetes screening  Glaucoma screening   Subjective:  Jon [name] is a 76 y.o. male who presents for Medicare Annual Wellness Visit and 3 month follow up for HTN, hyperlipidemia, prediabetes, and vitamin D Def.   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 He denies chest pain, shortness of breath, dizziness.  Fever, chills, HA, congestion having to use inhaler, cough with mucus, on advair, albuterol, zyrtec, ibuprofen x yesterday, happened very quickly.  He has a history of colon cancer in 2004, up to date on colonoscopy.  He is on cholesterol medication and denies myalgias. His cholesterol is at goal. The cholesterol last visit was:   Lab Results  Component Value Date   CHOL 183 11/27/2016   HDL 62 11/27/2016   LDLCALC 108 (H) 11/27/2016   TRIG 64 11/27/2016   CHOLHDL 3.0 11/27/2016   He has been working on diet and exercise for prediabetes, and denies paresthesia of the feet, polydipsia and polyuria. Last A1C in the office was:  Lab Results  Component Value Date   HGBA1C 5.6 11/27/2016   Patient is on Vitamin D supplement.   Lab Results  Component Value Date   VD25OH 89 11/27/2016     BMI is Body mass index is 26.46 kg/m., he is working on diet and exercise. Wt Readings from Last 3 Encounters:  02/06/17 174  lb (78.9 kg)  11/27/16 173 lb 12.8 oz (78.8 kg)  09/29/16 174 lb (78.9 kg)    Names of Other Physician/Practitioners you currently use: 1. [facility] here for primary care 2.  Dr. Jarrett Soho, dentist, next appointment in Sept Patient Care Team: Unk Pinto, MD as PCP - General (Internal Medicine) Linton Rump, MD as Consulting Physician (Ophthalmology)- Saw him last week Mayme Genta, MD as Consulting Physician (Gastroenterology)  Medication Review: Current Outpatient Prescriptions on File Prior to Visit  Medication Sig Dispense Refill  . atorvastatin (LIPITOR) 40 MG tablet Take 40 mg by mouth daily. Taking 10 mg    . cetirizine (ZYRTEC) 10 MG tablet TAKE 1 TABLET DAILY FOR ALLERGIES 90 tablet 4  . ezetimibe (ZETIA) 10 MG tablet Take 1 tablet (10 mg total) by mouth daily. 90 tablet 1  . Fluticasone-Salmeterol (ADVAIR DISKUS) 250-50 MCG/DOSE AEPB Inhale 1 puff into the lungs 2 (two) times daily. 60 each 4  . Ipratropium-Albuterol (COMBIVENT RESPIMAT) 20-100 MCG/ACT AERS respimat Inhale 1 puff into the lungs every 4 (four) hours as needed for wheezing. 4 g 4  . losartan (COZAAR) 50 MG tablet TAKE 1 TABLET DAILY FOR BLOOD PRESSURE 90 tablet 1  . verapamil (CALAN-SR) 240 MG CR tablet TAKE 1 TABLET DAILY WITH FOOD 90 tablet 1  . Vitamin D, Ergocalciferol, (DRISDOL) 50000 units CAPS capsule TAKE 1 CAPSULE DAILY FOR SEVERE VITAMIN D DEFICIENCY 90 capsule 2   No current facility-administered medications on file prior to visit.     Current Problems (verified) Patient Active Problem List   Diagnosis Date Noted  . Medication management 02/16/2014  . Hyperlipidemia   . Hypertension   . Vitamin D deficiency   . Prediabetes   . Asthma   . GERD (gastroesophageal reflux disease)   . History of colon cancer   . BPH (benign prostatic hyperplasia)     Screening Tests Immunization History  Administered Date(s) Administered  . DT 07/10/2009  .  Influenza Split 07/28/2013  . Influenza, High Dose Seasonal PF 09/18/2014, 07/26/2015, 08/18/2016  . Pneumococcal Conjugate-13 02/07/2016  . Pneumococcal Polysaccharide-23 02/22/2007   Preventative care: CT pelvis/AB 2007 Last colonoscopy: 2017, Dr. Earlean Shawl CXR 2009  Prior vaccinations: TD or Tdap: 2010  Influenza: 2017 Pneumococcal: 2008 Prevnar 13 2017 Shingles/Zostavax: declines  History reviewed: allergies, current medications, past family history, past medical history, past social history, past surgical history and problem list  Allergies Allergies  Allergen Reactions  . Mavik [Trandolapril]     Cough  . Hctz [Hydrochlorothiazide] Rash    Photosensitivity    SURGICAL HISTORY He  has a past surgical history that includes Colon surgery (2004). FAMILY HISTORY His family history includes Autoimmune disease in his brother; Cancer in his father and sister; Heart disease in his mother. SOCIAL HISTORY He  reports that he has never smoked. He has never used smokeless tobacco. He reports that he does not drink alcohol or use drugs.   MEDICARE WELLNESS OBJECTIVES: Physical activity:   Cardiac risk factors:   Depression/mood screen:   Depression screen [facility] 2/9 11/27/2016  Decreased Interest 0  Down, Depressed,  Hopeless 0  PHQ - 2 Score 0    ADLs:  In your present state of health, do you have any difficulty performing the following activities: 11/27/2016 05/08/2016  Hearing? N N  Vision? N N  Difficulty concentrating or making decisions? N N  Walking or climbing stairs? N N  Dressing or bathing? N N  Doing errands, shopping? N N  Some recent data might be hidden     Cognitive Testing  Alert? Yes  Normal Appearance?Yes  Oriented to person? Yes  Place? Yes   Time? Yes  Recall of three objects?  Yes  Can perform simple calculations? Yes  Displays appropriate judgment?Yes  Can read the correct time from a watch face?Yes  EOL planning:   YES   Objective:   Blood  pressure 140/84, pulse 88, temperature (!) 100.8 F (38.2 C), temperature source Temporal, resp. rate 18, height 5\' 8"  (1.727 m), weight 174 lb (78.9 kg), SpO2 95 %. Body mass index is 26.46 kg/m.  General appearance: alert, no distress, WD/WN, male HEENT: normocephalic, sclerae anicteric, TMs pearly, nares patent, no discharge or erythema, pharynx normal Oral cavity: MMM, no lesions Neck: supple, no lymphadenopathy, no thyromegaly, no masses Heart: RRR, normal S1, S2, no murmurs Lungs: CTA bilaterally, no wheezes, rhonchi, or rales Abdomen: +bs, soft, non tender, non distended, no masses, no hepatomegaly, no splenomegaly Musculoskeletal: nontender, no swelling, no obvious deformity Extremities: no edema, no cyanosis, no clubbing Pulses: 2+ symmetric, upper and lower extremities, normal cap refill Neurological: alert, oriented x 3, CN2-12 intact, strength normal upper extremities and lower extremities, sensation normal throughout, DTRs 2+ throughout, no cerebellar signs, gait normal Psychiatric: normal affect, behavior normal, pleasant   Medicare Attestation I have personally reviewed: The patient's medical and social history Their use of alcohol, tobacco or illicit drugs Their current medications and supplements The patient's functional ability including ADLs,fall risks, home safety risks, cognitive, and hearing and visual impairment Diet and physical activities Evidence for depression or mood disorders  The patient's weight, height, BMI, and visual acuity have been recorded in the chart.  I have made referrals, counseling, and provided education to the patient based on review of the above and I have provided the patient with a written personalized care plan for preventive services.     Vicie Mutters, PA-C   02/06/2017

## 2017-03-04 NOTE — Progress Notes (Signed)
Patient ID: Jon [name], male   DOB: [birth year], 76 y.o.   MRN: [id number]  Assessment and Plan:  Hypertension:  -Continue medication,  -monitor blood pressure at home.  -Continue DASH diet.   -Reminder to go to the ER if any CP, SOB, nausea, dizziness, severe HA, changes vision/speech, left arm numbness and tingling, and jaw pain.  Cholesterol: -Continue diet and exercise.  -Check cholesterol.   Pre-diabetes: -Continue diet and exercise.  -Check A1C  Vitamin D Def: -check level -continue medications.   Continue diet and meds as discussed. Further disposition pending results of labs. Future Appointments Date Time Provider [place]  06/11/2017 9:30 AM Unk Pinto, MD [facility] None  12/28/2017 9:00 AM Unk Pinto, MD [facility] None    HPI 76 y.o. male  presents for 3 month follow up with hypertension, hyperlipidemia, prediabetes and vitamin D.   His blood pressure has been controlled at home, today their BP is BP: 118/76.   He does workout. He denies chest pain, shortness of breath, dizziness.   He is on cholesterol medication and denies myalgias. His cholesterol is not at goal. The cholesterol last visit was:   Lab Results  Component Value Date   CHOL 183 11/27/2016   HDL 62 11/27/2016   LDLCALC 108 (H) 11/27/2016   TRIG 64 11/27/2016   CHOLHDL 3.0 11/27/2016     He has been working on diet and exercise for prediabetes, and denies foot ulcerations, hyperglycemia, hypoglycemia , increased appetite, nausea, paresthesia of the feet, polydipsia, polyuria, visual disturbances, vomiting and weight loss. Last A1C in the office was:  Lab Results  Component Value Date   HGBA1C 5.6 11/27/2016    Patient is on Vitamin D supplement.  Lab Results  Component Value Date   VD25OH 89 11/27/2016      Current Medications:  Current Outpatient Prescriptions on File Prior to Visit  Medication Sig Dispense Refill  . atorvastatin (LIPITOR) 40 MG tablet Take 40 mg  by mouth daily. Taking 10 mg    . cetirizine (ZYRTEC) 10 MG tablet TAKE 1 TABLET DAILY FOR ALLERGIES 90 tablet 4  . ezetimibe (ZETIA) 10 MG tablet Take 1 tablet (10 mg total) by mouth daily. 90 tablet 1  . Fluticasone-Salmeterol (ADVAIR DISKUS) 250-50 MCG/DOSE AEPB Inhale 1 puff into the lungs 2 (two) times daily. 60 each 4  . Ipratropium-Albuterol (COMBIVENT RESPIMAT) 20-100 MCG/ACT AERS respimat Inhale 1 puff into the lungs every 4 (four) hours as needed for wheezing. 4 g 4  . losartan (COZAAR) 50 MG tablet TAKE 1 TABLET DAILY FOR BLOOD PRESSURE 90 tablet 1  . verapamil (CALAN-SR) 240 MG CR tablet TAKE 1 TABLET DAILY WITH FOOD 90 tablet 1  . Vitamin D, Ergocalciferol, (DRISDOL) 50000 units CAPS capsule TAKE 1 CAPSULE DAILY FOR SEVERE VITAMIN D DEFICIENCY 90 capsule 2   No current facility-administered medications on file prior to visit.     Medical History:  Past Medical History:  Diagnosis Date  . Allergy   . Asthma   . BPH (benign prostatic hyperplasia)   . GERD (gastroesophageal reflux disease)   . History of colon cancer   . Hyperlipidemia   . Hypertension   . Prediabetes   . Vitamin D deficiency     Allergies:  Allergies  Allergen Reactions  . Mavik [Trandolapril]     Cough  . Hctz [Hydrochlorothiazide] Rash    Photosensitivity     Review of Systems:  Review of Systems  Constitutional: Negative for chills, fever and  malaise/fatigue.  HENT: Negative for congestion, ear pain and sore throat.   Eyes: Negative.   Respiratory: Negative for cough, shortness of breath and wheezing.   Cardiovascular: Negative for chest pain, palpitations and leg swelling.  Gastrointestinal: Negative for blood in stool, constipation, diarrhea, heartburn and melena.  Genitourinary: Negative.   Skin: Negative.   Neurological: Negative for dizziness, loss of consciousness and headaches.  Psychiatric/Behavioral: Negative for depression. The patient is not nervous/anxious and does not have  insomnia.     Family history- Review and unchanged  Social history- Review and unchanged  Physical Exam: BP 118/76   Pulse 67   Temp 97.3 F (36.3 C)   Resp 16   Ht 5\' 8"  (1.727 m)   Wt 176 lb 12.8 oz (80.2 kg)   SpO2 97%   BMI 26.88 kg/m  Wt Readings from Last 3 Encounters:  03/05/17 176 lb 12.8 oz (80.2 kg)  02/06/17 174 lb (78.9 kg)  11/27/16 173 lb 12.8 oz (78.8 kg)    General Appearance: Well nourished well developed, in no apparent distress. Eyes: PERRLA, EOMs, conjunctiva no swelling or erythema ENT/Mouth: Ear canals normal without obstruction, swelling, erythma, discharge.  TMs normal bilaterally.  Oropharynx moist, clear, without exudate, or postoropharyngeal swelling. Neck: Supple, thyroid normal,no cervical adenopathy  Respiratory: Respiratory effort normal, Breath sounds clear A&P without rhonchi, wheeze, or rale.  No retractions, no accessory usage. Cardio: RRR with no MRGs. Brisk peripheral pulses without edema.  Abdomen: Soft, + BS,  Non tender, no guarding, rebound, hernias, masses. Musculoskeletal: Full ROM, 5/5 strength, Normal gait Skin: Warm, dry without rashes, lesions, ecchymosis.  Neuro: Awake and oriented X 3, Cranial nerves intact. Normal muscle tone, no cerebellar symptoms. Psych: Normal affect, Insight and Judgment appropriate.    Vicie Mutters, PA-C 8:50 AM [facility]

## 2017-03-05 ENCOUNTER — Encounter: Payer: Self-pay | Admitting: Physician Assistant

## 2017-03-05 ENCOUNTER — Ambulatory Visit (INDEPENDENT_AMBULATORY_CARE_PROVIDER_SITE_OTHER): Payer: Medicare PPO | Admitting: Physician Assistant

## 2017-03-05 VITALS — BP 118/76 | HR 67 | Temp 97.3°F | Resp 16 | Ht 68.0 in | Wt 176.8 lb

## 2017-03-05 DIAGNOSIS — I10 Essential (primary) hypertension: Secondary | ICD-10-CM | POA: Diagnosis not present

## 2017-03-05 DIAGNOSIS — E782 Mixed hyperlipidemia: Secondary | ICD-10-CM | POA: Diagnosis not present

## 2017-03-05 DIAGNOSIS — E559 Vitamin D deficiency, unspecified: Secondary | ICD-10-CM

## 2017-03-05 DIAGNOSIS — Z79899 Other long term (current) drug therapy: Secondary | ICD-10-CM

## 2017-03-05 LAB — CBC WITH DIFFERENTIAL/PLATELET
BASOS PCT: 2 %
Basophils Absolute: 90 cells/uL (ref 0–200)
EOS ABS: 405 {cells}/uL (ref 15–500)
Eosinophils Relative: 9 %
HEMATOCRIT: 43.2 % (ref 38.5–50.0)
Hemoglobin: 14.1 g/dL (ref 13.2–17.1)
LYMPHS PCT: 33 %
Lymphs Abs: 1485 cells/uL (ref 850–3900)
MCH: 29.7 pg (ref 27.0–33.0)
MCHC: 32.6 g/dL (ref 32.0–36.0)
MCV: 91.1 fL (ref 80.0–100.0)
MONO ABS: 495 {cells}/uL (ref 200–950)
MPV: 10.4 fL (ref 7.5–12.5)
Monocytes Relative: 11 %
NEUTROS ABS: 2025 {cells}/uL (ref 1500–7800)
Neutrophils Relative %: 45 %
Platelets: 223 10*3/uL (ref 140–400)
RBC: 4.74 MIL/uL (ref 4.20–5.80)
RDW: 14.1 % (ref 11.0–15.0)
WBC: 4.5 10*3/uL (ref 3.8–10.8)

## 2017-03-05 NOTE — Patient Instructions (Signed)
Monitor your blood pressure at home. Go to the ER if any CP, SOB, nausea, dizziness, severe HA, changes vision/speech  Goal BP:  For patients younger than 60: Goal BP < 140/90. For patients 60 and older: Goal BP < 150/90. For patients with diabetes: Goal BP < 140/90. Your most recent BP: BP: 118/76   Take your medications faithfully as instructed. Maintain a healthy weight. Get at least 150 minutes of aerobic exercise per week. Minimize salt intake. Minimize alcohol intake  DASH Eating Plan DASH stands for "Dietary Approaches to Stop Hypertension." The DASH eating plan is a healthy eating plan that has been shown to reduce high blood pressure (hypertension). Additional health benefits may include reducing the risk of type 2 diabetes mellitus, heart disease, and stroke. The DASH eating plan may also help with weight loss. WHAT DO I NEED TO KNOW ABOUT THE DASH EATING PLAN? For the DASH eating plan, you will follow these general guidelines:  Choose foods with a percent daily value for sodium of less than 5% (as listed on the food label).  Use salt-free seasonings or herbs instead of table salt or sea salt.  Check with your health care provider or pharmacist before using salt substitutes.  Eat lower-sodium products, often labeled as "lower sodium" or "no salt added."  Eat fresh foods.  Eat more vegetables, fruits, and low-fat dairy products.  Choose whole grains. Look for the word "whole" as the first word in the ingredient list.  Choose fish and skinless chicken or [place] more often than red meat. Limit fish, poultry, and meat to 6 oz (170 g) each day.  Limit sweets, desserts, sugars, and sugary drinks.  Choose heart-healthy fats.  Limit cheese to 1 oz (28 g) per day.  Eat more home-cooked food and less restaurant, buffet, and fast food.  Limit fried foods.  Cook foods using methods other than frying.  Limit canned vegetables. If you do use them, rinse them well to  decrease the sodium.  When eating at a restaurant, ask that your food be prepared with less salt, or no salt if possible. WHAT FOODS CAN I EAT? Seek help from a dietitian for individual calorie needs. Grains Whole grain or whole wheat bread. Brown rice. Whole grain or whole wheat pasta. Quinoa, bulgur, and whole grain cereals. Low-sodium cereals. Corn or whole wheat flour tortillas. Whole grain cornbread. Whole grain crackers. Low-sodium crackers. Vegetables Fresh or frozen vegetables (raw, steamed, roasted, or grilled). Low-sodium or reduced-sodium tomato and vegetable juices. Low-sodium or reduced-sodium tomato sauce and paste. Low-sodium or reduced-sodium canned vegetables.  Fruits All fresh, canned (in natural juice), or frozen fruits. Meat and Other Protein Products Ground beef (85% or leaner), grass-fed beef, or beef trimmed of fat. Skinless chicken or [place]. Ground chicken or [place]. Pork trimmed of fat. All fish and seafood. Eggs. Dried beans, peas, or lentils. Unsalted nuts and seeds. Unsalted canned beans. Dairy Low-fat dairy products, such as skim or 1% milk, 2% or reduced-fat cheeses, low-fat ricotta or cottage cheese, or plain low-fat yogurt. Low-sodium or reduced-sodium cheeses. Fats and Oils Tub margarines without trans fats. Light or reduced-fat mayonnaise and salad dressings (reduced sodium). Avocado. Safflower, olive, or canola oils. Natural peanut or almond butter. Other Unsalted popcorn and pretzels. The items listed above may not be a complete list of recommended foods or beverages. Contact your dietitian for more options. WHAT FOODS ARE NOT RECOMMENDED? Grains White bread. White pasta. White rice. Refined cornbread. Bagels and croissants. Crackers that contain trans  fat. Vegetables Creamed or fried vegetables. Vegetables in a cheese sauce. Regular canned vegetables. Regular canned tomato sauce and paste. Regular tomato and vegetable juices. Fruits Dried fruits. Canned  fruit in light or heavy syrup. Fruit juice. Meat and Other Protein Products Fatty cuts of meat. Ribs, chicken wings, bacon, sausage, bologna, salami, chitterlings, fatback, hot dogs, bratwurst, and packaged luncheon meats. Salted nuts and seeds. Canned beans with salt. Dairy Whole or 2% milk, cream, half-and-half, and cream cheese. Whole-fat or sweetened yogurt. Full-fat cheeses or blue cheese. Nondairy creamers and whipped toppings. Processed cheese, cheese spreads, or cheese curds. Condiments Onion and garlic salt, seasoned salt, table salt, and sea salt. Canned and packaged gravies. Worcestershire sauce. Tartar sauce. Barbecue sauce. Teriyaki sauce. Soy sauce, including reduced sodium. Steak sauce. Fish sauce. Oyster sauce. Cocktail sauce. Horseradish. Ketchup and mustard. Meat flavorings and tenderizers. Bouillon cubes. Hot sauce. Tabasco sauce. Marinades. Taco seasonings. Relishes. Fats and Oils Butter, stick margarine, lard, shortening, ghee, and bacon fat. Coconut, palm kernel, or palm oils. Regular salad dressings. Other Pickles and olives. Salted popcorn and pretzels. The items listed above may not be a complete list of foods and beverages to avoid. Contact your dietitian for more information. WHERE CAN I FIND MORE INFORMATION? National Heart, Lung, and Blood Institute: travelstabloid.com Document Released: 10/09/2011 Document Revised: 03/06/2014 Document Reviewed: 08/24/2013 [facility] Patient Information 2015 [place], [place]. This information is not intended to replace advice given to you by your health care provider. Make sure you discuss any questions you have with your health care provider.

## 2017-03-06 LAB — BASIC METABOLIC PANEL WITH GFR
BUN: 17 mg/dL (ref 7–25)
CHLORIDE: 106 mmol/L (ref 98–110)
CO2: 28 mmol/L (ref 20–31)
Calcium: 9.1 mg/dL (ref 8.6–10.3)
Creat: 1.11 mg/dL (ref 0.70–1.18)
GFR, Est African American: 75 mL/min (ref >=60)
GFR, Est Non African American: 65 mL/min (ref >=60)
GLUCOSE: 89 mg/dL (ref 65–99)
POTASSIUM: 4.4 mmol/L (ref 3.5–5.3)
Sodium: 141 mmol/L (ref 135–146)

## 2017-03-06 LAB — HEPATIC FUNCTION PANEL
ALBUMIN: 3.8 g/dL (ref 3.6–5.1)
ALK PHOS: 81 U/L (ref 40–115)
ALT: 20 U/L (ref 9–46)
AST: 22 U/L (ref 10–35)
Bilirubin, Direct: 0.1 mg/dL (ref <=0.2)
Indirect Bilirubin: 0.4 mg/dL (ref 0.2–1.2)
TOTAL PROTEIN: 7 g/dL (ref 6.1–8.1)
Total Bilirubin: 0.5 mg/dL (ref 0.2–1.2)

## 2017-03-06 LAB — MAGNESIUM: Magnesium: 2.2 mg/dL (ref 1.5–2.5)

## 2017-03-06 LAB — LIPID PANEL
Cholesterol: 182 mg/dL (ref <200)
HDL: 58 mg/dL (ref >40)
LDL CALC: 101 mg/dL — AB (ref <100)
TRIGLYCERIDES: 117 mg/dL (ref <150)
Total CHOL/HDL Ratio: 3.1 Ratio (ref <5.0)
VLDL: 23 mg/dL (ref <30)

## 2017-03-06 LAB — TSH: TSH: 1.16 mIU/L (ref 0.40–4.50)

## 2017-06-11 ENCOUNTER — Other Ambulatory Visit: Payer: Self-pay | Admitting: *Deleted

## 2017-06-11 ENCOUNTER — Ambulatory Visit (INDEPENDENT_AMBULATORY_CARE_PROVIDER_SITE_OTHER): Payer: Medicare PPO | Admitting: Internal Medicine

## 2017-06-11 ENCOUNTER — Encounter: Payer: Self-pay | Admitting: Internal Medicine

## 2017-06-11 VITALS — BP 114/80 | HR 72 | Temp 97.8°F | Resp 18 | Ht 68.0 in | Wt 173.4 lb

## 2017-06-11 DIAGNOSIS — R7303 Prediabetes: Secondary | ICD-10-CM | POA: Diagnosis not present

## 2017-06-11 DIAGNOSIS — E782 Mixed hyperlipidemia: Secondary | ICD-10-CM | POA: Diagnosis not present

## 2017-06-11 DIAGNOSIS — M25561 Pain in right knee: Secondary | ICD-10-CM

## 2017-06-11 DIAGNOSIS — Z79899 Other long term (current) drug therapy: Secondary | ICD-10-CM | POA: Diagnosis not present

## 2017-06-11 DIAGNOSIS — E559 Vitamin D deficiency, unspecified: Secondary | ICD-10-CM

## 2017-06-11 DIAGNOSIS — I10 Essential (primary) hypertension: Secondary | ICD-10-CM | POA: Diagnosis not present

## 2017-06-11 DIAGNOSIS — K219 Gastro-esophageal reflux disease without esophagitis: Secondary | ICD-10-CM

## 2017-06-11 DIAGNOSIS — G8929 Other chronic pain: Secondary | ICD-10-CM

## 2017-06-11 LAB — CBC WITH DIFFERENTIAL/PLATELET
BASOS PCT: 2 %
Basophils Absolute: 118 cells/uL (ref 0–200)
EOS PCT: 7 %
Eosinophils Absolute: 413 cells/uL (ref 15–500)
HCT: 45.3 % (ref 38.5–50.0)
HEMOGLOBIN: 14.7 g/dL (ref 13.2–17.1)
LYMPHS ABS: 1652 {cells}/uL (ref 850–3900)
Lymphocytes Relative: 28 %
MCH: 29.5 pg (ref 27.0–33.0)
MCHC: 32.5 g/dL (ref 32.0–36.0)
MCV: 91 fL (ref 80.0–100.0)
MONO ABS: 708 {cells}/uL (ref 200–950)
MPV: 10.7 fL (ref 7.5–12.5)
Monocytes Relative: 12 %
NEUTROS ABS: 3009 {cells}/uL (ref 1500–7800)
Neutrophils Relative %: 51 %
Platelets: 241 10*3/uL (ref 140–400)
RBC: 4.98 MIL/uL (ref 4.20–5.80)
RDW: 14.6 % (ref 11.0–15.0)
WBC: 5.9 10*3/uL (ref 3.8–10.8)

## 2017-06-11 LAB — BASIC METABOLIC PANEL WITH GFR
BUN: 19 mg/dL (ref 7–25)
CALCIUM: 8.8 mg/dL (ref 8.6–10.3)
CHLORIDE: 105 mmol/L (ref 98–110)
CO2: 24 mmol/L (ref 20–32)
Creat: 1.14 mg/dL (ref 0.70–1.18)
GFR, EST NON AFRICAN AMERICAN: 62 mL/min (ref >=60)
GFR, Est African American: 72 mL/min (ref >=60)
Glucose, Bld: 96 mg/dL (ref 65–99)
Potassium: 4.1 mmol/L (ref 3.5–5.3)
SODIUM: 140 mmol/L (ref 135–146)

## 2017-06-11 LAB — HEPATIC FUNCTION PANEL
ALT: 23 U/L (ref 9–46)
AST: 22 U/L (ref 10–35)
Albumin: 4 g/dL (ref 3.6–5.1)
Alkaline Phosphatase: 97 U/L (ref 40–115)
BILIRUBIN DIRECT: 0.1 mg/dL (ref <=0.2)
BILIRUBIN INDIRECT: 0.4 mg/dL (ref 0.2–1.2)
Total Bilirubin: 0.5 mg/dL (ref 0.2–1.2)
Total Protein: 7 g/dL (ref 6.1–8.1)

## 2017-06-11 LAB — LIPID PANEL
CHOL/HDL RATIO: 2.9 ratio (ref <5.0)
CHOLESTEROL: 172 mg/dL (ref <200)
HDL: 60 mg/dL (ref >40)
LDL CALC: 95 mg/dL (ref <100)
TRIGLYCERIDES: 86 mg/dL (ref <150)
VLDL: 17 mg/dL (ref <30)

## 2017-06-11 LAB — MAGNESIUM: Magnesium: 2 mg/dL (ref 1.5–2.5)

## 2017-06-11 LAB — TSH: TSH: 1.23 mIU/L (ref 0.40–4.50)

## 2017-06-11 MED ORDER — LOSARTAN POTASSIUM 50 MG PO TABS: 50.0000 mg | ORAL_TABLET | Freq: Every day | ORAL | 3 refills | Status: DC

## 2017-06-11 NOTE — Patient Instructions (Signed)

## 2017-06-11 NOTE — Progress Notes (Signed)
This very nice 76 y.o. Married Filipino presents for 6 month follow up with Hypertension, Hyperlipidemia, Pre-Diabetes and Vitamin D Deficiency. Patient s/p ressection Colon Ca in 2004 w/o recurrence and most recent Colonoscopy was in 2016.      Patient has been an  Avid jogger in the past & this has recently been curtailed due to increased Right knee pains.     Patient is treated for HTN (1996) & BP has been controlled at home. Today's BP is at goal -114/80. Patient has had no complaints of any cardiac type chest pain, palpitations, dyspnea/orthopnea/PND, dizziness, claudication, or dependent edema.     Hyperlipidemia is controlled with diet & meds. Patient denies myalgias or other med SE's. Last Lipids were  Lab Results  Component Value Date   CHOL 182 03/05/2017   HDL 58 03/05/2017   LDLCALC 101 (H) 03/05/2017   TRIG 117 03/05/2017   CHOLHDL 3.1 03/05/2017      Also, the patient has history of PreDiabetes (A1c 6.3% in 2013) and has had no symptoms of reactive hypoglycemia, diabetic polys, paresthesias or visual blurring.  Last A1c was at goal: Lab Results  Component Value Date   HGBA1C 5.6 11/27/2016      Further, the patient also has history of Vitamin D Deficiency ("24" in 2008) and supplements vitamin D without any suspected side-effects. Last vitamin D was at goal: Lab Results  Component Value Date   VD25OH 89 11/27/2016   Current Outpatient Prescriptions on File Prior to Visit  Medication Sig  . atorvastatin (LIPITOR) 40 MG tablet Take 40 mg by mouth daily. Taking 10 mg  . cetirizine (ZYRTEC) 10 MG tablet TAKE 1 TABLET DAILY FOR ALLERGIES  . ezetimibe (ZETIA) 10 MG tablet Take 1 tablet (10 mg total) by mouth daily.  . Fluticasone-Salmeterol (ADVAIR DISKUS) 250-50 MCG/DOSE AEPB Inhale 1 puff into the lungs 2 (two) times daily.  . Ipratropium-Albuterol (COMBIVENT RESPIMAT) 20-100 MCG/ACT AERS respimat Inhale 1 puff into the lungs every 4 (four) hours as needed for  wheezing.  . verapamil (CALAN-SR) 240 MG CR tablet TAKE 1 TABLET DAILY WITH FOOD  . Vitamin D, Ergocalciferol, (DRISDOL) 50000 units CAPS capsule TAKE 1 CAPSULE DAILY FOR SEVERE VITAMIN D DEFICIENCY   No current facility-administered medications on file prior to visit.    Allergies  Allergen Reactions  . Mavik [Trandolapril]     Cough  . Hctz [Hydrochlorothiazide] Rash    Photosensitivity   PMHx:   Past Medical History:  Diagnosis Date  . Allergy   . Asthma   . BPH (benign prostatic hyperplasia)   . GERD (gastroesophageal reflux disease)   . History of colon cancer   . Hyperlipidemia   . Hypertension   . Prediabetes   . Vitamin D deficiency    Immunization History  Administered Date(s) Administered  . DT 07/10/2009  . Influenza Split 07/28/2013  . Influenza, High Dose Seasonal PF 09/18/2014, 07/26/2015, 08/18/2016  . Pneumococcal Conjugate-13 02/07/2016  . Pneumococcal Polysaccharide-23 02/22/2007   Past Surgical History:  Procedure Laterality Date  . COLON SURGERY  2004   Dr Rosana Hoes   FHx:    Reviewed / unchanged  SHx:    Reviewed / unchanged  Systems Review:  Constitutional: Denies fever, chills, wt changes, headaches, insomnia, fatigue, night sweats, change in appetite. Eyes: Denies redness, blurred vision, diplopia, discharge, itchy, watery eyes.  ENT: Denies discharge, congestion, post nasal drip, epistaxis, sore throat, earache, hearing loss, dental pain, tinnitus, vertigo, sinus  pain, snoring.  CV: Denies chest pain, palpitations, irregular heartbeat, syncope, dyspnea, diaphoresis, orthopnea, PND, claudication or edema. Respiratory: denies cough, dyspnea, DOE, pleurisy, hoarseness, laryngitis, wheezing.  Gastrointestinal: Denies dysphagia, odynophagia, heartburn, reflux, water brash, abdominal pain or cramps, nausea, vomiting, bloating, diarrhea, constipation, hematemesis, melena, hematochezia  or hemorrhoids. Genitourinary: Denies dysuria, frequency, urgency,  nocturia, hesitancy, discharge, hematuria or flank pain. Musculoskeletal: Denies arthralgias, myalgias, stiffness, jt. swelling, pain, limping or strain/sprain.  Skin: Denies pruritus, rash, hives, warts, acne, eczema or change in skin lesion(s). Neuro: No weakness, tremor, incoordination, spasms, paresthesia or pain. Psychiatric: Denies confusion, memory loss or sensory loss. Endo: Denies change in weight, skin or hair change.  Heme/Lymph: No excessive bleeding, bruising or enlarged lymph nodes.  Physical Exam  BP 114/80   Pulse 72   Temp 97.8 F (36.6 C)   Resp 18   Ht 5\' 8"  (1.727 m)   Wt 173 lb 6.4 oz (78.7 kg)   BMI 26.37 kg/m   Appears well nourished, well groomed  and in no distress.  Eyes: PERRLA, EOMs, conjunctiva no swelling or erythema. Sinuses: No frontal/maxillary tenderness ENT/Mouth: EAC's clear, TM's nl w/o erythema, bulging. Nares clear w/o erythema, swelling, exudates. Oropharynx clear without erythema or exudates. Oral hygiene is good. Tongue normal, non obstructing. Hearing intact.  Neck: Supple. Thyroid nl. Car 2+/2+ without bruits, nodes or JVD. Chest: Respirations nl with BS clear & equal w/o rales, rhonchi, wheezing or stridor.  Cor: Heart sounds normal w/ regular rate and rhythm without sig. murmurs, gallops, clicks or rubs. Peripheral pulses normal and equal  without edema.  Abdomen: Soft & bowel sounds normal. Non-tender w/o guarding, rebound, hernias, masses or organomegaly.  Lymphatics: Unremarkable.  Musculoskeletal: Full ROM all peripheral extremities, joint stability, 5/5 strength and normal gait.  Skin: Warm, dry without exposed rashes, lesions or ecchymosis apparent.  Neuro: Cranial nerves intact, reflexes equal bilaterally. Sensory-motor testing grossly intact. Tendon reflexes grossly intact.  Pysch: Alert & oriented x 3.  Insight and judgement nl & appropriate. No ideations.  Assessment and Plan:  1. Essential hypertension  - Continue  medication, monitor blood pressure at home.  - Continue DASH diet. Reminder to go to the ER if any CP,  SOB, nausea, dizziness, severe HA, changes vision/speech.  - CBC with Differential/Platelet - BASIC METABOLIC PANEL WITH GFR - Magnesium - TSH  2. Hyperlipidemia, mixed  - Continue diet/meds, exercise,& lifestyle modifications.  - Continue monitor periodic cholesterol/liver & renal functions   - Hepatic function panel - Lipid panel - TSH  3. Prediabetes  - Continue diet, exercise, lifestyle modifications.  - Monitor appropriate labs.  - Hemoglobin A1c - Insulin, random  4. Vitamin D deficiency  - Continue supplementation.  - VITAMIN D 25 Hydroxy  5. Gastroesophageal reflux disease  6. Medication management  - CBC with Differential/Platelet - BASIC METABOLIC PANEL WITH GFR - Hepatic function panel - Magnesium - Lipid panel - TSH - Hemoglobin A1c - Insulin, random - VITAMIN D 25 Hydroxy        Discussed  regular exercise, BP monitoring, weight control to achieve/maintain BMI less than 25 and discussed med and SE's. Recommended labs to assess and monitor clinical status with further disposition pending results of labs. Over 30 minutes of exam, counseling, chart review was performed.

## 2017-06-12 LAB — INSULIN, RANDOM: INSULIN: 4.9 u[IU]/mL (ref 2.0–19.6)

## 2017-06-12 LAB — VITAMIN D 25 HYDROXY (VIT D DEFICIENCY, FRACTURES): VIT D 25 HYDROXY: 87 ng/mL (ref 30–100)

## 2017-06-12 LAB — HEMOGLOBIN A1C
HEMOGLOBIN A1C: 5.7 % — AB (ref <5.7)
Mean Plasma Glucose: 117 mg/dL

## 2017-06-24 ENCOUNTER — Ambulatory Visit (INDEPENDENT_AMBULATORY_CARE_PROVIDER_SITE_OTHER): Payer: Medicare PPO

## 2017-06-24 ENCOUNTER — Ambulatory Visit (INDEPENDENT_AMBULATORY_CARE_PROVIDER_SITE_OTHER): Payer: Medicare PPO | Admitting: Orthopaedic Surgery

## 2017-06-24 DIAGNOSIS — G8929 Other chronic pain: Secondary | ICD-10-CM | POA: Diagnosis not present

## 2017-06-24 DIAGNOSIS — M25561 Pain in right knee: Secondary | ICD-10-CM | POA: Diagnosis not present

## 2017-06-24 DIAGNOSIS — M170 Bilateral primary osteoarthritis of knee: Secondary | ICD-10-CM | POA: Insufficient documentation

## 2017-06-24 DIAGNOSIS — M1711 Unilateral primary osteoarthritis, right knee: Secondary | ICD-10-CM

## 2017-06-24 MED ORDER — METHYLPREDNISOLONE ACETATE 40 MG/ML IJ SUSP
40.0000 mg | INTRAMUSCULAR | Status: AC | PRN
  Administered 2017-06-24: 40 mg via INTRA_ARTICULAR

## 2017-06-24 MED ORDER — LIDOCAINE HCL 1 % IJ SOLN
3.0000 mL | INTRAMUSCULAR | Status: AC | PRN
  Administered 2017-06-24: 3 mL

## 2017-06-24 NOTE — Progress Notes (Signed)
Office Visit Note   Patient: Jon [name]           Date of Birth: [birth year]           MRN: [id number] Visit Date: 06/24/2017              Requested by: Unk Pinto, [address] [place], [place] [address] PCP: Unk Pinto, MD   Assessment & Plan: Visit Diagnoses:  1. Chronic pain of right knee   2. Unilateral primary osteoarthritis, right knee     Plan: I showed him a knee model and went over his x-rays. I do feel that trying a steroid injection could help calm down synovitis in the knee. I talked about quad strengthening exercises well. I expanded the risk and benefits of steroid injections. Have one done. He tolerated this well. He is definitely a candidate for a hyaluronic acid injection that knee as well I gave him information of this and he would like for [place] to order this injection for him. I'll see him back in 4 weeks to hopefully provide a hyaluronic acid injection into his right knee.  Follow-Up Instructions: Return in about 4 weeks (around 07/22/2017).   Orders:  Orders Placed This Encounter  Procedures  . Large Joint Injection/Arthrocentesis  . XR Knee 1-2 Views Right   No orders of the defined types were placed in this encounter.     Procedures: Large Joint Inj Date/Time: 06/24/2017 2:31 PM Performed by: Mcarthur Rossetti Authorized by: Mcarthur Rossetti   Location:  Knee Site:  R knee Ultrasound Guidance: No   Fluoroscopic Guidance: No   Arthrogram: No   Medications:  3 mL lidocaine 1 %; 40 mg methylPREDNISolone acetate 40 MG/ML     Clinical Data: No additional findings.   Subjective: No chief complaint on file. 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 He denies a specific injury and he denies any locking catching is been a runner for many years now. He would like to continue running. He says it hurts mainly with  impacting type of activities and he has held off from running for last 2 months. Just worked on activity modification and rest.  HPI  Review of Systems He denies any headache, chest pain, short ref, fever, chills, nausea, vomiting.  Objective: Vital Signs: There were no vitals taken for this visit.  Physical Exam He is alert and oriented 3 in no acute distress Ortho Exam Examination of the right knee shows medial joint line tenderness. His range of motion is full. There is slight varus malalignment of the knee. There is no significant patellofemoral crepitation. His Lachman's and McMurray's exams are negative. Specialty Comments:  No specialty comments available.  Imaging: Xr Knee 1-2 Views Right  Result Date: 06/24/2017 An AP and lateral of the right knee show mild to moderate arthritic changes in the medial compartment of the knee with slight varus malalignment. There is no acute findings otherwise.    PMFS History: Patient Active Problem List   Diagnosis Date Noted  . Chronic pain of right knee 06/24/2017  . Unilateral primary osteoarthritis, right knee 06/24/2017  . Medication management 02/16/2014  . Hyperlipidemia   . Hypertension   . Vitamin D deficiency   . Prediabetes   . Asthma   . GERD (gastroesophageal reflux disease)   . History of colon cancer   . BPH (benign  prostatic hyperplasia)    Past Medical History:  Diagnosis Date  . Allergy   . Asthma   . BPH (benign prostatic hyperplasia)   . GERD (gastroesophageal reflux disease)   . History of colon cancer   . Hyperlipidemia   . Hypertension   . Prediabetes   . Vitamin D deficiency     Family History  Problem Relation Age of Onset  . Heart disease Mother   . Cancer Father   . Cancer Sister   . Autoimmune disease Brother     Past Surgical History:  Procedure Laterality Date  . COLON SURGERY  2004   Dr Rosana Hoes   Social History   Occupational History  . Not on file.   Social History Main Topics   . Smoking status: Never Smoker  . Smokeless tobacco: Never Used  . Alcohol use No  . Drug use: No  . Sexual activity: Not on file

## 2017-07-05 ENCOUNTER — Other Ambulatory Visit: Payer: Self-pay | Admitting: Internal Medicine

## 2017-07-13 ENCOUNTER — Ambulatory Visit (INDEPENDENT_AMBULATORY_CARE_PROVIDER_SITE_OTHER): Payer: Medicare PPO | Admitting: *Deleted

## 2017-07-13 DIAGNOSIS — Z23 Encounter for immunization: Secondary | ICD-10-CM

## 2017-07-20 DIAGNOSIS — M9903 Segmental and somatic dysfunction of lumbar region: Secondary | ICD-10-CM | POA: Diagnosis not present

## 2017-07-20 DIAGNOSIS — M5116 Intervertebral disc disorders with radiculopathy, lumbar region: Secondary | ICD-10-CM | POA: Diagnosis not present

## 2017-07-22 ENCOUNTER — Ambulatory Visit (INDEPENDENT_AMBULATORY_CARE_PROVIDER_SITE_OTHER): Payer: Medicare PPO | Admitting: Orthopaedic Surgery

## 2017-07-22 DIAGNOSIS — M1711 Unilateral primary osteoarthritis, right knee: Secondary | ICD-10-CM | POA: Diagnosis not present

## 2017-07-22 MED ORDER — HYALURONAN 88 MG/4ML IX SOSY
88.0000 mg | PREFILLED_SYRINGE | INTRA_ARTICULAR | Status: AC | PRN
  Administered 2017-07-22: 88 mg via INTRA_ARTICULAR

## 2017-07-22 NOTE — Progress Notes (Signed)
   Procedure Note  Patient: Jon [name]             Date of Birth: [birth year]           MRN: [id number]             Visit Date: 07/22/2017  Procedures: Visit Diagnoses: No diagnosis found.  Large Joint Inj Date/Time: 07/22/2017 9:00 AM Performed by: Mcarthur Rossetti Authorized by: Mcarthur Rossetti   Location:  Knee Site:  R knee Ultrasound Guidance: No   Fluoroscopic Guidance: No   Arthrogram: No   Medications:  88 mg Hyaluronan 88 MG/4ML   The patient has known osteoarthritis of his right knee. He tolerated steroid injection well and is here for a hyaluronic acid injection. He is doing well overall. On examination of his knee he has excellent range motion of his right knee with no effusion and he mainly has medial joint line tenderness secondary to his osteoarthritis.  We talked about a hyaluronic acid injection the risk and benefits of this. With laced injection without difficulty. He'll follow-up as needed. He understands that we can always place a steroid injection in his knee in the next 3-6 months if needed. All questions and concerns were answered and addressed.

## 2017-07-27 DIAGNOSIS — S335XXA Sprain of ligaments of lumbar spine, initial encounter: Secondary | ICD-10-CM | POA: Diagnosis not present

## 2017-07-27 DIAGNOSIS — M5116 Intervertebral disc disorders with radiculopathy, lumbar region: Secondary | ICD-10-CM | POA: Diagnosis not present

## 2017-08-03 DIAGNOSIS — M9903 Segmental and somatic dysfunction of lumbar region: Secondary | ICD-10-CM | POA: Diagnosis not present

## 2017-08-03 DIAGNOSIS — M5116 Intervertebral disc disorders with radiculopathy, lumbar region: Secondary | ICD-10-CM | POA: Diagnosis not present

## 2017-08-07 DIAGNOSIS — M9903 Segmental and somatic dysfunction of lumbar region: Secondary | ICD-10-CM | POA: Diagnosis not present

## 2017-08-07 DIAGNOSIS — M5116 Intervertebral disc disorders with radiculopathy, lumbar region: Secondary | ICD-10-CM | POA: Diagnosis not present

## 2017-08-12 DIAGNOSIS — M5116 Intervertebral disc disorders with radiculopathy, lumbar region: Secondary | ICD-10-CM | POA: Diagnosis not present

## 2017-08-12 DIAGNOSIS — M9903 Segmental and somatic dysfunction of lumbar region: Secondary | ICD-10-CM | POA: Diagnosis not present

## 2017-08-12 DIAGNOSIS — S335XXA Sprain of ligaments of lumbar spine, initial encounter: Secondary | ICD-10-CM | POA: Diagnosis not present

## 2017-08-18 DIAGNOSIS — H43813 Vitreous degeneration, bilateral: Secondary | ICD-10-CM | POA: Diagnosis not present

## 2017-08-18 DIAGNOSIS — H524 Presbyopia: Secondary | ICD-10-CM | POA: Diagnosis not present

## 2017-08-18 DIAGNOSIS — H2513 Age-related nuclear cataract, bilateral: Secondary | ICD-10-CM | POA: Diagnosis not present

## 2017-08-18 DIAGNOSIS — H04123 Dry eye syndrome of bilateral lacrimal glands: Secondary | ICD-10-CM | POA: Diagnosis not present

## 2017-08-18 DIAGNOSIS — H52223 Regular astigmatism, bilateral: Secondary | ICD-10-CM | POA: Diagnosis not present

## 2017-08-18 DIAGNOSIS — H353111 Nonexudative age-related macular degeneration, right eye, early dry stage: Secondary | ICD-10-CM | POA: Diagnosis not present

## 2017-09-10 NOTE — Progress Notes (Signed)
FOLLOW UP  Assessment and Plan:   Hypertension He has not taken his BP meds today and presents with elevated value; encouraged for him not to skip these esp on days he is coming in to be evaluated. Will bring back for recheck in 1 week.  Monitor blood pressure at home; patient to call if consistently greater than 130/80 Continue DASH diet.   Reminder to go to the ER if any CP, SOB, nausea, dizziness, severe HA, changes vision/speech, left arm numbness and tingling and jaw pain.  Cholesterol Continue medications  Continue low cholesterol diet and exercise.  Check lipid panel.   Prediabetes Continue diet and exercise.  Perform daily foot/skin check, notify office of any concerning changes.  Check A1C  Vitamin D Def/ osteoporosis prevention Continue supplementation Check Vit D level  Asthma, mild, persistant He reports asthma with increased symptoms in the fall and winter seasons; well managed with current regimen when he does take his medications.  Continue diet and meds as discussed. Further disposition pending results of labs. Discussed med's effects and SE's.   Over 30 minutes of exam, counseling, chart review, and critical decision making was performed.   Future Appointments  Date Time Provider [place]  12/28/2017  9:00 AM Unk Pinto, MD [facility] None    ----------------------------------------------------------------------------------------------------------------------  HPI 76 y.o. male  presents for 3 month follow up on hypertension, cholesterol, prediabetes and vitamin D deficiency. He is following Dr. Jean Rosenthal for bilateral knee arthritis for which is is getting hyaluronic acid injections as well. He has stopped running in the interim until his follow up in February. He is also seeing a chiropractor for some ongoing lower back muscular pain that improves with adjustments and core strengthening exercises that he has been prescribed. He  reports increased AM secretions and albuterol use 1-2 times per week during the fall/winter seasons as he typically does. He reports he has not taken any of his medications today.   BMI is Body mass index is 26.88 kg/m., he has been working on diet and exercise. Wt Readings from Last 3 Encounters:  09/11/17 176 lb 12.8 oz (80.2 kg)  06/11/17 173 lb 6.4 oz (78.7 kg)  03/05/17 176 lb 12.8 oz (80.2 kg)   His blood pressure has been controlled at home (120s/80s), today their BP is BP: (!) 148/88 He reports he forgot to take his BP medications this AM.   He does workout. He denies chest pain, shortness of breath, dizziness.   He is on cholesterol medication and denies myalgias. His cholesterol is at goal. The cholesterol last visit was:   Lab Results  Component Value Date   CHOL 172 06/11/2017   HDL 60 06/11/2017   LDLCALC 95 06/11/2017   TRIG 86 06/11/2017   CHOLHDL 2.9 06/11/2017    He has been working on diet and exercise for prediabetes, and denies increased appetite, nausea, paresthesia of the feet, polydipsia, polyuria, visual disturbances and vomiting. Last A1C in the office was:  Lab Results  Component Value Date   HGBA1C 5.7 (H) 06/11/2017   Patient is on Vitamin D supplement and at goal:    Lab Results  Component Value Date   VD25OH 87 06/11/2017        Current Medications:  Current Outpatient Medications on File Prior to Visit  Medication Sig  . atorvastatin (LIPITOR) 40 MG tablet Take 40 mg by mouth daily. Taking 10 mg  . cetirizine (ZYRTEC) 10 MG tablet TAKE 1 TABLET DAILY FOR ALLERGIES  .  ezetimibe (ZETIA) 10 MG tablet Take 1 tablet (10 mg total) by mouth daily.  Marland Kitchen losartan (COZAAR) 50 MG tablet Take 1 tablet (50 mg total) by mouth daily. for blood pressure  . verapamil (CALAN-SR) 240 MG CR tablet TAKE 1 TABLET DAILY WITH FOOD  . Vitamin D, Ergocalciferol, (DRISDOL) 50000 units CAPS capsule TAKE 1 CAPSULE DAILY FOR SEVERE VITAMIN D DEFICIENCY   No current  facility-administered medications on file prior to visit.      Allergies:  Allergies  Allergen Reactions  . Mavik [Trandolapril]     Cough  . Hctz [Hydrochlorothiazide] Rash    Photosensitivity     Medical History:  Past Medical History:  Diagnosis Date  . Allergy   . Asthma   . BPH (benign prostatic hyperplasia)   . GERD (gastroesophageal reflux disease)   . History of colon cancer   . Hyperlipidemia   . Hypertension   . Prediabetes   . Vitamin D deficiency    Family history- Reviewed and unchanged Social history- Reviewed and unchanged   Review of Systems:  ROS    Physical Exam: BP (!) 148/88   Pulse 83   Temp 98.1 F (36.7 C)   Ht 5\' 8"  (1.727 m)   Wt 176 lb 12.8 oz (80.2 kg)   SpO2 98%   BMI 26.88 kg/m  Wt Readings from Last 3 Encounters:  09/11/17 176 lb 12.8 oz (80.2 kg)  06/11/17 173 lb 6.4 oz (78.7 kg)  03/05/17 176 lb 12.8 oz (80.2 kg)   General Appearance: Well nourished, in no apparent distress. Eyes: PERRLA, EOMs, conjunctiva no swelling or erythema Sinuses: No Frontal/maxillary tenderness ENT/Mouth: Ext aud canals clear, TMs without erythema, bulging. No erythema, swelling, or exudate on post pharynx.  Tonsils not swollen or erythematous. Hearing normal.  Neck: Supple, thyroid normal.  Respiratory: Respiratory effort normal, BS - Left side with coarse expiratory wheezing, Right side clear throughout  without rales, rhonchi, wheezing or stridor. He has not taken his medications today.  Cardio: RRR with no MRGs. Brisk peripheral pulses without edema.  Abdomen: Soft, + BS.  Non tender, no guarding, rebound, hernias, masses. Lymphatics: Non tender without lymphadenopathy.  Musculoskeletal: Full ROM, 5/5 strength, Normal gait Skin: Warm, dry without rashes, lesions, ecchymosis.  Neuro: Cranial nerves intact. No cerebellar symptoms.  Psych: Awake and oriented X 3, normal affect, Insight and Judgment appropriate.    Izora Ribas, NP 9:26  AM [facility]

## 2017-09-11 ENCOUNTER — Ambulatory Visit (INDEPENDENT_AMBULATORY_CARE_PROVIDER_SITE_OTHER): Payer: Medicare PPO | Admitting: Adult Health

## 2017-09-11 ENCOUNTER — Encounter: Payer: Self-pay | Admitting: Adult Health

## 2017-09-11 VITALS — BP 148/88 | HR 83 | Temp 98.1°F | Ht 68.0 in | Wt 176.8 lb

## 2017-09-11 DIAGNOSIS — I10 Essential (primary) hypertension: Secondary | ICD-10-CM | POA: Diagnosis not present

## 2017-09-11 DIAGNOSIS — E782 Mixed hyperlipidemia: Secondary | ICD-10-CM

## 2017-09-11 DIAGNOSIS — J4530 Mild persistent asthma, uncomplicated: Secondary | ICD-10-CM

## 2017-09-11 DIAGNOSIS — Z79899 Other long term (current) drug therapy: Secondary | ICD-10-CM | POA: Diagnosis not present

## 2017-09-11 DIAGNOSIS — E559 Vitamin D deficiency, unspecified: Secondary | ICD-10-CM | POA: Diagnosis not present

## 2017-09-11 DIAGNOSIS — R7303 Prediabetes: Secondary | ICD-10-CM

## 2017-09-11 MED ORDER — IPRATROPIUM-ALBUTEROL 20-100 MCG/ACT IN AERS: 1.0000 | INHALATION_SPRAY | RESPIRATORY_TRACT | 4 refills | Status: DC | PRN

## 2017-09-11 MED ORDER — FLUTICASONE-SALMETEROL 250-50 MCG/DOSE IN AEPB: 1.0000 | INHALATION_SPRAY | Freq: Two times a day (BID) | RESPIRATORY_TRACT | 4 refills | Status: DC

## 2017-09-14 LAB — HEMOGLOBIN A1C
HEMOGLOBIN A1C: 5.9 %{Hb} — AB (ref <5.7)
Mean Plasma Glucose: 123 (calc)
eAG (mmol/L): 6.8 (calc)

## 2017-09-14 LAB — HEPATIC FUNCTION PANEL
AG Ratio: 1.2 (calc) (ref 1.0–2.5)
ALT: 23 U/L (ref 9–46)
AST: 23 U/L (ref 10–35)
Albumin: 3.9 g/dL (ref 3.6–5.1)
Alkaline phosphatase (APISO): 92 U/L (ref 40–115)
BILIRUBIN INDIRECT: 0.4 mg/dL (ref 0.2–1.2)
Bilirubin, Direct: 0.1 mg/dL (ref 0.0–0.2)
GLOBULIN: 3.2 g/dL (ref 1.9–3.7)
TOTAL PROTEIN: 7.1 g/dL (ref 6.1–8.1)
Total Bilirubin: 0.5 mg/dL (ref 0.2–1.2)

## 2017-09-14 LAB — CBC WITH DIFFERENTIAL/PLATELET
BASOS PCT: 2.1 %
Basophils Absolute: 109 cells/uL (ref 0–200)
Eosinophils Absolute: 385 cells/uL (ref 15–500)
Eosinophils Relative: 7.4 %
HCT: 43.3 % (ref 38.5–50.0)
Hemoglobin: 14.4 g/dL (ref 13.2–17.1)
Lymphs Abs: 1602 cells/uL (ref 850–3900)
MCH: 29.6 pg (ref 27.0–33.0)
MCHC: 33.3 g/dL (ref 32.0–36.0)
MCV: 88.9 fL (ref 80.0–100.0)
MONOS PCT: 10.6 %
MPV: 10.9 fL (ref 7.5–12.5)
Neutro Abs: 2553 cells/uL (ref 1500–7800)
Neutrophils Relative %: 49.1 %
PLATELETS: 216 10*3/uL (ref 140–400)
RBC: 4.87 10*6/uL (ref 4.20–5.80)
RDW: 13.5 % (ref 11.0–15.0)
TOTAL LYMPHOCYTE: 30.8 %
WBC mixed population: 551 cells/uL (ref 200–950)
WBC: 5.2 10*3/uL (ref 3.8–10.8)

## 2017-09-14 LAB — BASIC METABOLIC PANEL WITH GFR
BUN: 15 mg/dL (ref 7–25)
CALCIUM: 9 mg/dL (ref 8.6–10.3)
CHLORIDE: 103 mmol/L (ref 98–110)
CO2: 29 mmol/L (ref 20–32)
Creat: 1.14 mg/dL (ref 0.70–1.18)
GFR, Est African American: 72 mL/min/{1.73_m2} (ref >=60)
GFR, Est Non African American: 62 mL/min/{1.73_m2} (ref >=60)
GLUCOSE: 94 mg/dL (ref 65–99)
POTASSIUM: 4.3 mmol/L (ref 3.5–5.3)
Sodium: 140 mmol/L (ref 135–146)

## 2017-09-14 LAB — INSULIN, RANDOM: INSULIN: 4.2 u[IU]/mL (ref 2.0–19.6)

## 2017-09-14 LAB — LIPID PANEL
CHOLESTEROL: 192 mg/dL (ref <200)
HDL: 59 mg/dL (ref >40)
LDL CHOLESTEROL (CALC): 113 mg/dL — AB
Non-HDL Cholesterol (Calc): 133 mg/dL (calc) — ABNORMAL HIGH (ref <130)
TRIGLYCERIDES: 102 mg/dL (ref <150)
Total CHOL/HDL Ratio: 3.3 (calc) (ref <5.0)

## 2017-09-14 LAB — TSH: TSH: 1.87 m[IU]/L (ref 0.40–4.50)

## 2017-09-14 LAB — VITAMIN D 25 HYDROXY (VIT D DEFICIENCY, FRACTURES): VIT D 25 HYDROXY: 71 ng/mL (ref 30–100)

## 2017-09-18 ENCOUNTER — Ambulatory Visit (INDEPENDENT_AMBULATORY_CARE_PROVIDER_SITE_OTHER): Payer: Medicare PPO

## 2017-09-18 VITALS — BP 138/84

## 2017-09-18 DIAGNOSIS — Z0130 Encounter for examination of blood pressure without abnormal findings: Secondary | ICD-10-CM | POA: Diagnosis not present

## 2017-09-18 NOTE — Progress Notes (Signed)
Pt presents for blood pressure check.  Pt states he did take his blood pressure pill today which he had forgotten to take at his last office visit.

## 2017-09-28 ENCOUNTER — Other Ambulatory Visit: Payer: Self-pay | Admitting: Internal Medicine

## 2017-10-08 ENCOUNTER — Other Ambulatory Visit: Payer: Self-pay | Admitting: *Deleted

## 2017-10-08 MED ORDER — VERAPAMIL HCL ER 240 MG PO TBCR: 240.0000 mg | EXTENDED_RELEASE_TABLET | Freq: Every day | ORAL | 1 refills | Status: DC

## 2017-11-04 ENCOUNTER — Other Ambulatory Visit: Payer: Self-pay | Admitting: Physician Assistant

## 2017-11-04 MED ORDER — VITAMIN D (ERGOCALCIFEROL) 1.25 MG (50000 UNIT) PO CAPS: ORAL_CAPSULE | ORAL | 2 refills | Status: DC

## 2017-11-04 NOTE — Progress Notes (Signed)
Future Appointments  Date Time Provider [place]  12/28/2017  9:00 AM Unk Pinto, MD [facility] None

## 2017-12-28 ENCOUNTER — Encounter: Payer: Self-pay | Admitting: Internal Medicine

## 2017-12-28 ENCOUNTER — Ambulatory Visit: Payer: Medicare PPO | Admitting: Internal Medicine

## 2017-12-28 VITALS — BP 136/82 | HR 76 | Temp 97.3°F | Resp 16 | Ht 67.0 in | Wt 178.0 lb

## 2017-12-28 DIAGNOSIS — N320 Bladder-neck obstruction: Secondary | ICD-10-CM | POA: Diagnosis not present

## 2017-12-28 DIAGNOSIS — E782 Mixed hyperlipidemia: Secondary | ICD-10-CM | POA: Diagnosis not present

## 2017-12-28 DIAGNOSIS — Z136 Encounter for screening for cardiovascular disorders: Secondary | ICD-10-CM | POA: Diagnosis not present

## 2017-12-28 DIAGNOSIS — J45909 Unspecified asthma, uncomplicated: Secondary | ICD-10-CM

## 2017-12-28 DIAGNOSIS — R7303 Prediabetes: Secondary | ICD-10-CM | POA: Diagnosis not present

## 2017-12-28 DIAGNOSIS — E559 Vitamin D deficiency, unspecified: Secondary | ICD-10-CM

## 2017-12-28 DIAGNOSIS — Z79899 Other long term (current) drug therapy: Secondary | ICD-10-CM | POA: Diagnosis not present

## 2017-12-28 DIAGNOSIS — I77812 Thoracoabdominal aortic ectasia: Secondary | ICD-10-CM

## 2017-12-28 DIAGNOSIS — Z1212 Encounter for screening for malignant neoplasm of rectum: Secondary | ICD-10-CM

## 2017-12-28 DIAGNOSIS — R7309 Other abnormal glucose: Secondary | ICD-10-CM

## 2017-12-28 DIAGNOSIS — Z0000 Encounter for general adult medical examination without abnormal findings: Secondary | ICD-10-CM | POA: Diagnosis not present

## 2017-12-28 DIAGNOSIS — I10 Essential (primary) hypertension: Secondary | ICD-10-CM

## 2017-12-28 DIAGNOSIS — Z0001 Encounter for general adult medical examination with abnormal findings: Secondary | ICD-10-CM

## 2017-12-28 DIAGNOSIS — Z8249 Family history of ischemic heart disease and other diseases of the circulatory system: Secondary | ICD-10-CM

## 2017-12-28 DIAGNOSIS — Z1211 Encounter for screening for malignant neoplasm of colon: Secondary | ICD-10-CM

## 2017-12-28 MED ORDER — MONTELUKAST SODIUM 10 MG PO TABS: ORAL_TABLET | ORAL | 3 refills | Status: DC

## 2017-12-28 NOTE — Progress Notes (Signed)
[name]   Unk Pinto, M.D.     [name]. [name], P.A.-C Liane Comber, [place]                [address] [place], [place] [address] Telephone [phone] Telefax [phone] Annual  Screening/Preventative Visit  & Comprehensive Evaluation & Examination     This very nice 77 y.o. [name] presents for a Screening/Preventative Visit & comprehensive evaluation and management of multiple medical co-morbidities.  Patient has been followed for HTN, HLD, Prediabetes and Vitamin D Deficiency. Patient has hx/o Colon Ca (2004) w/o known recurrance.     HTN predates circa 1996. Patient's BP has been controlled at home.  Today's BP is at goal -  136/82. Patient denies any cardiac symptoms as chest pain, palpitations, shortness of breath, dizziness or ankle swelling.     Patient's hyperlipidemia is controlled with diet and medications. Patient denies myalgias or other medication SE's. Last lipids were at goal: Lab Results  Component Value Date   CHOL 193 12/28/2017   HDL 59 12/28/2017   LDLCALC 95 06/11/2017   TRIG 96 12/28/2017   CHOLHDL 3.3 12/28/2017      Patient has prediabetes (A1c 6.3%/2013)  and patient denies reactive hypoglycemic symptoms, visual blurring, diabetic polys or paresthesias. Last A1c was not at goal: Lab Results  Component Value Date   HGBA1C 5.9 (H) 09/11/2017       Finally, patient has history of Vitamin D Deficiency ("24"/2008)  and last vitamin D was at goal: Lab Results  Component Value Date   VD25OH 71 09/11/2017   Current Outpatient Medications on File Prior to Visit  Medication Sig  . atorvastatin (LIPITOR) 40 MG tablet Take 40 mg by mouth daily. Taking 10 mg  . cetirizine (ZYRTEC) 10 MG tablet TAKE 1 TABLET DAILY FOR ALLERGIES  . ezetimibe (ZETIA) 10 MG tablet TAKE 1 TABLET DAILY  . Fluticasone-Salmeterol (ADVAIR DISKUS) 250-50 MCG/DOSE AEPB Inhale 1  puff 2 (two) times daily into the lungs.  . Ipratropium-Albuterol (COMBIVENT RESPIMAT) 20-100 MCG/ACT AERS respimat Inhale 1 puff every 4 (four) hours as needed into the lungs for wheezing.  Marland Kitchen losartan (COZAAR) 50 MG tablet Take 1 tablet (50 mg total) by mouth daily. for blood pressure  . verapamil (CALAN-SR) 240 MG CR tablet Take 1 tablet (240 mg total) by mouth daily. with food  . Vitamin D, Ergocalciferol, (DRISDOL) 50000 units CAPS capsule TAKE 1 CAPSULE DAILY FOR SEVERE VITAMIN D DEFICIENCY   No current facility-administered medications on file prior to visit.    Allergies  Allergen Reactions  . Mavik [Trandolapril]     Cough  . Hctz [Hydrochlorothiazide] Rash    Photosensitivity   Past Medical History:  Diagnosis Date  . Allergy   . Asthma   . BPH (benign prostatic hyperplasia)   . GERD (gastroesophageal reflux disease)   . History of colon cancer   . Hyperlipidemia   . Hypertension   . Prediabetes   . Vitamin D deficiency    Health Maintenance  Topic Date Due  . TETANUS/TDAP  04/20/2019  . INFLUENZA VACCINE  Completed  . PNA vac Low Risk Adult  Completed   Immunization History  Administered Date(s) Administered  . DT 07/10/2009  . Influenza Split 07/28/2013  . Influenza, High Dose Seasonal PF 09/18/2014, 07/26/2015, 08/18/2016, 07/13/2017  . Pneumococcal Conjugate-13 02/07/2016  .  Pneumococcal Polysaccharide-23 02/22/2007   Last Colon -  Past Surgical History:  Procedure Laterality Date  . COLON SURGERY  2004   Dr Rosana Hoes   Family History  Problem Relation Age of Onset  . Heart disease Mother   . Cancer Father   . Cancer Sister   . Autoimmune disease Brother    Social History   Socioeconomic History  . Marital status: Married    Spouse name: Manuela Schwartz  . Number of children: 3  Occupational History  . 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 Smoking status: Never Smoker  . Smokeless tobacco: Never Used   Substance and Sexual Activity  . Alcohol use: No  . Drug use: No  . Sexual activity: Not on file    ROS Constitutional: Denies fever, chills, weight loss/gain, headaches, insomnia,  night sweats or change in appetite. Does c/o fatigue. Eyes: Denies redness, blurred vision, diplopia, discharge, itchy or watery eyes.  ENT: Denies discharge, congestion, post nasal drip, epistaxis, sore throat, earache, hearing loss, dental pain, Tinnitus, Vertigo, Sinus pain or snoring.  Cardio: Denies chest pain, palpitations, irregular heartbeat, syncope, dyspnea, diaphoresis, orthopnea, PND, claudication or edema Respiratory: denies cough, dyspnea, DOE, pleurisy, hoarseness, laryngitis or wheezing.  Gastrointestinal: Denies dysphagia, heartburn, reflux, water brash, pain, cramps, nausea, vomiting, bloating, diarrhea, constipation, hematemesis, melena, hematochezia, jaundice or hemorrhoids Genitourinary: Denies dysuria, frequency, urgency, nocturia, hesitancy, discharge, hematuria or flank pain Musculoskeletal: Denies arthralgia, myalgia, stiffness, Jt. Swelling, pain, limp or strain/sprain. Denies Falls. Skin: Denies puritis, rash, hives, warts, acne, eczema or change in skin lesion Neuro: No weakness, tremor, incoordination, spasms, paresthesia or pain Psychiatric: Denies confusion, memory loss or sensory loss. Denies Depression. Endocrine: Denies change in weight, skin, hair change, nocturia, and paresthesia, diabetic polys, visual blurring or hyper / hypo glycemic episodes.  Heme/Lymph: No excessive bleeding, bruising or enlarged lymph nodes.  Physical Exam  BP 136/82   Pulse 76   Temp (!) 97.3 F (36.3 C)   Resp 16   Ht 5\' 7"  (1.702 m)   Wt 178 lb (80.7 kg)   BMI 27.88 kg/m   General Appearance: Well nourished and well groomed and in no apparent distress.  Eyes: PERRLA, EOMs, conjunctiva no swelling or erythema, normal fundi and vessels. Sinuses: No frontal/maxillary tenderness ENT/Mouth:  EACs patent / TMs  nl. Nares clear without erythema, swelling, mucoid exudates. Oral hygiene is good. No erythema, swelling, or exudate. Tongue normal, non-obstructing. Tonsils not swollen or erythematous. Hearing normal.  Neck: Supple, thyroid not palpable. No bruits, nodes or JVD. Respiratory: Respiratory effort normal.  BS equal and clear bilateral without rales, rhonci, wheezing or stridor. Cardio: Heart sounds are normal with regular rate and rhythm and no murmurs, rubs or gallops. Peripheral pulses are normal and equal bilaterally without edema. No aortic or femoral bruits. Chest: symmetric with normal excursions and percussion.  Abdomen: Soft, with Nl bowel sounds. Nontender, no guarding, rebound, hernias, masses, or organomegaly.  Lymphatics: Non tender without lymphadenopathy.  Genitourinary: No hernias.Testes nl. DRE - prostate nl for age - smooth & firm w/o nodules. Musculoskeletal: Full ROM all peripheral extremities, joint stability, 5/5 strength, and normal gait. Skin: Warm and dry without rashes, lesions, cyanosis, clubbing or  ecchymosis.  Neuro: Cranial nerves intact, reflexes equal bilaterally. Normal muscle tone, no cerebellar symptoms. Sensation intact.  Pysch: Alert and oriented X 3 with normal affect, insight and judgment appropriate.   Assessment and Plan  1. Annual  Preventative/Screening Exam   2. Essential hypertension  - EKG 12-Lead - [place], RETROPERITNL ABD,  LTD - Urinalysis, Routine w reflex microscopic - Microalbumin / creatinine urine ratio - CBC with Differential/Platelet - BASIC METABOLIC PANEL WITH GFR - Magnesium - TSH  3. Hyperlipidemia, mixed  - EKG 12-Lead - [place], RETROPERITNL ABD,  LTD - Hepatic function panel - Lipid panel - TSH  4. Abnormal glucose  - EKG 12-Lead - [place], RETROPERITNL ABD,  LTD - Hemoglobin A1c - Insulin, random  5. Vitamin D deficiency  - VITAMIN D 25 Hydroxyl  6. Prediabetes  - EKG 12-Lead - [place], RETROPERITNL ABD,   LTD - Hemoglobin A1c - Insulin, random  7. Uncomplicated asthma   8. Screening for colorectal cancer  - POC Hemoccult Bld/Stl  9. Bladder neck obstruction  - PSA  10. Screening for ischemic heart disease  - EKG 12-Lead  11. Screening for AAA (aortic abdominal aneurysm)  - [place], RETROPERITNL ABD,  LTD  12. Aortic ectasia, thoracoabdominal (HCC)  - [place], RETROPERITNL ABD,  LTD  13. FH: heart disease  - EKG 12-Lead - [place], RETROPERITNL ABD,  LTD  14. Medication management  - Urinalysis, Routine w reflex microscopic - Microalbumin / creatinine urine ratio - CBC with Differential/Platelet - BASIC METABOLIC PANEL WITH GFR - Hepatic function panel - Magnesium - Lipid panel - TSH - Hemoglobin A1c - Insulin, random - VITAMIN D 25 Hydroxy         Patient was counseled in prudent diet, weight control to achieve/maintain BMI less than 25, BP monitoring, regular exercise and medications as discussed.  Discussed med effects and SE's. Routine screening labs and tests as requested with regular follow-up as recommended. Over 40 minutes of exam, counseling, chart review and high complex critical decision making was performed

## 2017-12-28 NOTE — Patient Instructions (Signed)

## 2017-12-29 ENCOUNTER — Other Ambulatory Visit: Payer: Self-pay | Admitting: Internal Medicine

## 2017-12-29 DIAGNOSIS — E782 Mixed hyperlipidemia: Secondary | ICD-10-CM

## 2017-12-29 LAB — HEPATIC FUNCTION PANEL
AG Ratio: 1.4 (calc) (ref 1.0–2.5)
ALKALINE PHOSPHATASE (APISO): 106 U/L (ref 40–115)
ALT: 30 U/L (ref 9–46)
AST: 27 U/L (ref 10–35)
Albumin: 4.3 g/dL (ref 3.6–5.1)
BILIRUBIN DIRECT: 0.1 mg/dL (ref 0.0–0.2)
BILIRUBIN TOTAL: 0.5 mg/dL (ref 0.2–1.2)
Globulin: 3.1 g/dL (calc) (ref 1.9–3.7)
Indirect Bilirubin: 0.4 mg/dL (calc) (ref 0.2–1.2)
Total Protein: 7.4 g/dL (ref 6.1–8.1)

## 2017-12-29 LAB — CBC WITH DIFFERENTIAL/PLATELET
Basophils Absolute: 97 cells/uL (ref 0–200)
Basophils Relative: 1.4 %
EOS ABS: 476 {cells}/uL (ref 15–500)
Eosinophils Relative: 6.9 %
HEMATOCRIT: 44.6 % (ref 38.5–50.0)
HEMOGLOBIN: 14.6 g/dL (ref 13.2–17.1)
LYMPHS ABS: 2098 {cells}/uL (ref 850–3900)
MCH: 28.9 pg (ref 27.0–33.0)
MCHC: 32.7 g/dL (ref 32.0–36.0)
MCV: 88.3 fL (ref 80.0–100.0)
MPV: 10.5 fL (ref 7.5–12.5)
Monocytes Relative: 9.4 %
NEUTROS ABS: 3581 {cells}/uL (ref 1500–7800)
NEUTROS PCT: 51.9 %
Platelets: 238 10*3/uL (ref 140–400)
RBC: 5.05 10*6/uL (ref 4.20–5.80)
RDW: 13.1 % (ref 11.0–15.0)
Total Lymphocyte: 30.4 %
WBC mixed population: 649 cells/uL (ref 200–950)
WBC: 6.9 10*3/uL (ref 3.8–10.8)

## 2017-12-29 LAB — URINALYSIS, ROUTINE W REFLEX MICROSCOPIC
BILIRUBIN URINE: NEGATIVE
GLUCOSE, UA: NEGATIVE
Hgb urine dipstick: NEGATIVE
Ketones, ur: NEGATIVE
Leukocytes, UA: NEGATIVE
Nitrite: NEGATIVE
Protein, ur: NEGATIVE
SPECIFIC GRAVITY, URINE: 1.004 (ref 1.001–1.03)
pH: 6.5 (ref 5.0–8.0)

## 2017-12-29 LAB — MAGNESIUM: Magnesium: 2.3 mg/dL (ref 1.5–2.5)

## 2017-12-29 LAB — MICROALBUMIN / CREATININE URINE RATIO
Creatinine, Urine: 19 mg/dL — ABNORMAL LOW (ref 20–320)
Microalb, Ur: <0.2 mg/dL

## 2017-12-29 LAB — LIPID PANEL
Cholesterol: 193 mg/dL (ref <200)
HDL: 59 mg/dL (ref >40)
LDL Cholesterol (Calc): 114 mg/dL (calc) — ABNORMAL HIGH
NON-HDL CHOLESTEROL (CALC): 134 mg/dL — AB (ref <130)
Total CHOL/HDL Ratio: 3.3 (calc) (ref <5.0)
Triglycerides: 96 mg/dL (ref <150)

## 2017-12-29 LAB — BASIC METABOLIC PANEL WITH GFR
BUN: 19 mg/dL (ref 7–25)
CO2: 28 mmol/L (ref 20–32)
CREATININE: 0.92 mg/dL (ref 0.70–1.18)
Calcium: 9.2 mg/dL (ref 8.6–10.3)
Chloride: 105 mmol/L (ref 98–110)
GFR, EST AFRICAN AMERICAN: 93 mL/min/{1.73_m2} (ref >=60)
GFR, EST NON AFRICAN AMERICAN: 81 mL/min/{1.73_m2} (ref >=60)
Glucose, Bld: 104 mg/dL — ABNORMAL HIGH (ref 65–99)
POTASSIUM: 4.3 mmol/L (ref 3.5–5.3)
SODIUM: 142 mmol/L (ref 135–146)

## 2017-12-29 LAB — HEMOGLOBIN A1C
Hgb A1c MFr Bld: 6 % of total Hgb — ABNORMAL HIGH (ref <5.7)
MEAN PLASMA GLUCOSE: 126 (calc)
eAG (mmol/L): 7 (calc)

## 2017-12-29 LAB — PSA: PSA: 0.4 ng/mL (ref <=4.0)

## 2017-12-29 LAB — INSULIN, RANDOM: Insulin: 12.7 u[IU]/mL (ref 2.0–19.6)

## 2017-12-29 LAB — TSH: TSH: 1.2 m[IU]/L (ref 0.40–4.50)

## 2017-12-29 LAB — VITAMIN D 25 HYDROXY (VIT D DEFICIENCY, FRACTURES): Vit D, 25-Hydroxy: 98 ng/mL (ref 30–100)

## 2017-12-29 MED ORDER — ROSUVASTATIN CALCIUM 40 MG PO TABS: ORAL_TABLET | ORAL | 3 refills | Status: DC

## 2018-01-01 ENCOUNTER — Other Ambulatory Visit: Payer: Self-pay | Admitting: *Deleted

## 2018-01-01 MED ORDER — ATORVASTATIN CALCIUM 10 MG PO TABS: 10.0000 mg | ORAL_TABLET | Freq: Every day | ORAL | 1 refills | Status: DC

## 2018-04-22 DIAGNOSIS — E663 Overweight: Secondary | ICD-10-CM | POA: Insufficient documentation

## 2018-04-22 NOTE — Progress Notes (Signed)
MEDICARE ANNUAL WELLNESS VISIT AND FOLLOW UP Assessment:    Encounter for Medicare annual wellness exam  Essential hypertension - continue medications, DASH diet, exercise and monitor at home. Call if greater than 130/80.   Mixed hyperlipidemia Stop atorvastatin, continue crestor/zetia and reevaluate  -continue medications, check lipids, decrease fatty foods, increase activity.   Prediabetes Discussed general issues about diabetes pathophysiology and management., Educational material distributed., Suggested low cholesterol diet., Encouraged aerobic exercise., Discussed foot care., Reminded to get yearly retinal exam.  Vitamin D deficiency Continue supplement  Medication management  Uncomplicated asthma, unspecified asthma severity, unspecified whether persistent Controlled on inhalers, avoid triggers  Gastroesophageal reflux disease, esophagitis presence not specified Continue PPI/H2 blocker, diet discussed  Benign prostatic hyperplasia with nocturia Continue to monitor PSA at CPE  History of colon cancer UTD on colonoscopy  Overweight Long discussion about weight loss, diet, and exercise Recommended diet heavy in fruits and veggies and low in animal meats, cheeses, and dairy products, appropriate calorie intake Discussed appropriate weight for height  Follow up at next visit     Future Appointments  Date Time Provider [place]  07/28/2018  9:30 AM Unk Pinto, MD [facility] None  01/17/2019  9:00 AM Unk Pinto, MD [facility] None      Plan:   During the course of the visit the patient was educated and counseled about appropriate screening and preventive services including:    Pneumococcal vaccine   Influenza vaccine  Td vaccine  Screening electrocardiogram  Colorectal cancer screening  Diabetes screening  Glaucoma screening   Subjective:  Jon [name] is a 77 y.o. male who presents for Medicare Annual Wellness Visit and 3  month follow up for HTN, hyperlipidemia, prediabetes, and vitamin D Def. He has a history of colon cancer in 2004, up to date on colonoscopy. Asthma well controlled on inhalers.   BMI is Body mass index is 28.04 kg/m., he has been working on diet and exercise. 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  He denies chest pain, shortness of breath, dizziness.  He is on cholesterol medication (crestor and zetia) and denies myalgias. His cholesterol is at goal. The cholesterol last visit was:   Lab Results  Component Value Date   CHOL 193 12/28/2017   HDL 59 12/28/2017   LDLCALC 114 (H) 12/28/2017   TRIG 96 12/28/2017   CHOLHDL 3.3 12/28/2017   He has been working on diet and exercise for prediabetes, and denies paresthesia of the feet, polydipsia and polyuria. Last A1C in the office was:  Lab Results  Component Value Date   HGBA1C 6.0 (H) 12/28/2017   Patient is on Vitamin D supplement.   Lab Results  Component Value Date   VD25OH 98 12/28/2017      Medication Review: Current Outpatient Medications on File Prior to Visit  Medication Sig Dispense Refill  . atorvastatin (LIPITOR) 10 MG tablet Take 1 tablet (10 mg total) by mouth daily. 90 tablet 1  . cetirizine (ZYRTEC) 10 MG tablet TAKE 1 TABLET DAILY FOR ALLERGIES 90 tablet 4  . ezetimibe (ZETIA) 10 MG tablet TAKE 1 TABLET DAILY 90 tablet 1  .  Fluticasone-Salmeterol (ADVAIR DISKUS) 250-50 MCG/DOSE AEPB Inhale 1 puff 2 (two) times daily into the lungs. 60 each 4  . Ipratropium-Albuterol (COMBIVENT RESPIMAT) 20-100 MCG/ACT AERS respimat Inhale 1 puff every 4 (four) hours as needed into the lungs for wheezing. 4 g 4  . losartan (COZAAR) 50 MG tablet  Take 1 tablet (50 mg total) by mouth daily. for blood pressure 90 tablet 3  . montelukast (SINGULAIR) 10 MG tablet Take 1 tablet daily for Allergy & Asthma 90 tablet 3  . rosuvastatin (CRESTOR) 40 MG tablet Take 1/2 to 1 tablet daily or as directed for Cholesterol 90 tablet 3  . verapamil (CALAN-SR) 240 MG CR tablet Take 1 tablet (240 mg total) by mouth daily. with food 90 tablet 1  . Vitamin D, Ergocalciferol, (DRISDOL) 50000 units CAPS capsule TAKE 1 CAPSULE DAILY FOR SEVERE VITAMIN D DEFICIENCY 90 capsule 2   No current facility-administered medications on file prior to visit.     Current Problems (verified) Patient Active Problem List   Diagnosis Date Noted  . Overweight (BMI 25.0-29.9) 04/22/2018  . Osteoarthritis of knees, bilateral 06/24/2017  . Medication management 02/16/2014  . Hyperlipidemia   . Hypertension   . Vitamin D deficiency   . Prediabetes   . Asthma   . GERD (gastroesophageal reflux disease)   . History of colon cancer   . BPH (benign prostatic hyperplasia)     Screening Tests Immunization History  Administered Date(s) Administered  . DT 07/10/2009  . Influenza Split 07/28/2013  . Influenza, High Dose Seasonal PF 09/18/2014, 07/26/2015, 08/18/2016, 07/13/2017  . Pneumococcal Conjugate-13 02/07/2016  . Pneumococcal Polysaccharide-23 02/22/2007   Preventative care: CT pelvis/AB 2007 Last colonoscopy: 2017, Dr. Earlean Shawl CXR 2009  Prior vaccinations: TD or Tdap: 2010  Influenza: 2018 Pneumococcal: 2008 Prevnar 13 2017 Shingles/Zostavax: declines  Names of Other Physician/Practitioners you currently use: 1. [facility] here for primary care 2.  Family dentistry, dentist, next appointment in Sept/2018 - has next appointment next week 3.  Dr. Posey Pronto, vision, 05/2017, has upcoming appointment  Patient Care Team: Unk Pinto, MD as PCP - General (Internal Medicine) [place] Cantor, MD as Consulting Physician  (Ophthalmology) Richmond Campbell, MD as Consulting Physician (Gastroenterology) Ladell Pier, MD as Consulting Physician (Oncology)   History reviewed: allergies, current medications, past family history, past medical history, past social history, past surgical history and problem list  Allergies Allergies  Allergen Reactions  . Mavik [Trandolapril]     Cough  . Hctz [Hydrochlorothiazide] Rash    Photosensitivity    SURGICAL HISTORY He  has a past surgical history that includes Colon surgery (2004). FAMILY HISTORY His family history includes Autoimmune disease in his brother; Cancer in his father and sister; Heart disease in his mother. SOCIAL HISTORY He  reports that he has never smoked. He has never used smokeless tobacco. He reports that he does not drink alcohol or use drugs.   MEDICARE WELLNESS OBJECTIVES: Physical activity: Current Exercise Habits: Home exercise routine, Type of exercise: walking, Time (Minutes): 30, Frequency (Times/Week): 7, Weekly Exercise (Minutes/Week): 210, Intensity: Mild, Exercise limited by: orthopedic condition(s)(right knee arthritis) Cardiac risk factors: Cardiac Risk Factors include: advanced age (>51men, >54 women);male gender;dyslipidemia;hypertension;smoking/ tobacco exposure(Second hand smoke) Depression/mood screen:   Depression screen [facility] 2/9 04/23/2018  Decreased Interest 0  Down, Depressed, Hopeless 0  PHQ - 2 Score 0    ADLs:  In your present state of health, do you have any difficulty performing the following activities: 04/23/2018  12/28/2017  Hearing? N N  Vision? N N  Difficulty concentrating or making decisions? N N  Walking or climbing stairs? N N  Dressing or bathing? N N  Doing errands, shopping? N N  Some recent data might be hidden     Cognitive Testing  Alert? Yes  Normal Appearance?Yes  Oriented to person? Yes  Place? Yes   Time? Yes  Recall of three objects?  2/3  Can perform simple calculations? Yes  Displays  appropriate judgment?Yes  Can read the correct time from a watch face?Yes  EOL planning: Does Patient Have a Medical Advance Directive?: Yes Type of Advance Directive: Healthcare Power of Attorney, Living will Does patient want to make changes to medical advance directive?: No - Patient declined Copy of [place] in Chart?: No - copy requested   Objective:   Blood pressure 128/82, pulse 74, temperature 97.7 F (36.5 C), height 5\' 7"  (1.702 m), weight 179 lb (81.2 kg), SpO2 98 %. Body mass index is 28.04 kg/m.  General appearance: alert, no distress, WD/WN, male HEENT: normocephalic, sclerae anicteric, TMs pearly, nares patent, no discharge or erythema, pharynx normal Oral cavity: MMM, no lesions Neck: supple, no lymphadenopathy, no thyromegaly, no masses Heart: RRR, normal S1, S2, no murmurs Lungs: CTA bilaterally, no wheezes, rhonchi, or rales Abdomen: +bs, soft, non tender, non distended, no masses, no hepatomegaly, no splenomegaly Musculoskeletal: nontender, no swelling, no obvious deformity Extremities: no edema, no cyanosis, no clubbing Pulses: 2+ symmetric, upper and lower extremities, normal cap refill Neurological: alert, oriented x 3, CN2-12 intact, strength normal upper extremities and lower extremities, sensation normal throughout, DTRs 2+ throughout, no cerebellar signs, gait normal Psychiatric: normal affect, behavior normal, pleasant   Medicare Attestation I have personally reviewed: The patient's medical and social history Their use of alcohol, tobacco or illicit drugs Their current medications and supplements The patient's functional ability including ADLs,fall risks, home safety risks, cognitive, and hearing and visual impairment Diet and physical activities Evidence for depression or mood disorders  The patient's weight, height, BMI, and visual acuity have been recorded in the chart.  I have made referrals, counseling, and provided education  to the patient based on review of the above and I have provided the patient with a written personalized care plan for preventive services.     Izora Ribas, NP   04/23/2018

## 2018-04-23 ENCOUNTER — Ambulatory Visit: Payer: 59 | Admitting: Adult Health

## 2018-04-23 ENCOUNTER — Encounter: Payer: Self-pay | Admitting: Adult Health

## 2018-04-23 VITALS — BP 128/82 | HR 74 | Temp 97.7°F | Ht 67.0 in | Wt 179.0 lb

## 2018-04-23 DIAGNOSIS — J4530 Mild persistent asthma, uncomplicated: Secondary | ICD-10-CM | POA: Diagnosis not present

## 2018-04-23 DIAGNOSIS — R7303 Prediabetes: Secondary | ICD-10-CM

## 2018-04-23 DIAGNOSIS — E782 Mixed hyperlipidemia: Secondary | ICD-10-CM | POA: Diagnosis not present

## 2018-04-23 DIAGNOSIS — N401 Enlarged prostate with lower urinary tract symptoms: Secondary | ICD-10-CM | POA: Diagnosis not present

## 2018-04-23 DIAGNOSIS — R351 Nocturia: Secondary | ICD-10-CM

## 2018-04-23 DIAGNOSIS — I10 Essential (primary) hypertension: Secondary | ICD-10-CM

## 2018-04-23 DIAGNOSIS — Z85038 Personal history of other malignant neoplasm of large intestine: Secondary | ICD-10-CM

## 2018-04-23 DIAGNOSIS — Z79899 Other long term (current) drug therapy: Secondary | ICD-10-CM | POA: Diagnosis not present

## 2018-04-23 DIAGNOSIS — Z0000 Encounter for general adult medical examination without abnormal findings: Secondary | ICD-10-CM

## 2018-04-23 DIAGNOSIS — K219 Gastro-esophageal reflux disease without esophagitis: Secondary | ICD-10-CM | POA: Diagnosis not present

## 2018-04-23 DIAGNOSIS — M170 Bilateral primary osteoarthritis of knee: Secondary | ICD-10-CM | POA: Diagnosis not present

## 2018-04-23 DIAGNOSIS — E559 Vitamin D deficiency, unspecified: Secondary | ICD-10-CM

## 2018-04-23 DIAGNOSIS — E663 Overweight: Secondary | ICD-10-CM | POA: Diagnosis not present

## 2018-04-23 MED ORDER — IPRATROPIUM-ALBUTEROL 20-100 MCG/ACT IN AERS: 1.0000 | INHALATION_SPRAY | RESPIRATORY_TRACT | 4 refills | Status: DC | PRN

## 2018-04-23 NOTE — Patient Instructions (Addendum)
Ask about the shingles vaccine at the [facility]     Aim for 7+ servings of fruits and vegetables daily  80+ fluid ounces of water or unsweet tea for healthy kidneys  Limit alcohol intake, avoid smoking  Limit animal fats in diet for cholesterol and heart health - choose grass fed whenever available  Aim for low stress - take time to unwind and care for your mental health  Aim for 150 min of moderate intensity exercise weekly for heart health, and weights twice weekly for bone health  Aim for 7-9 hours of sleep daily     When it comes to diets, agreement about the perfect plan isn't easy to find, even among the experts. Experts at the [employer] developed an idea known as the Healthy Eating Plate. Just imagine a plate divided into logical, healthy portions.  The emphasis is on diet quality:  Load up on vegetables and fruits - one-half of your plate: Aim for color and variety, and remember that potatoes don't count.  Go for whole grains - one-quarter of your plate: Whole wheat, barley, wheat berries, quinoa, oats, brown rice, and foods made with them. If you want pasta, go with whole wheat pasta.  Protein power - one-quarter of your plate: Fish, chicken, beans, and nuts are all healthy, versatile protein sources. Limit red meat.  The diet, however, does go beyond the plate, offering a few other suggestions.  Use healthy plant oils, such as olive, canola, soy, corn, sunflower and peanut. Check the labels, and avoid partially hydrogenated oil, which have unhealthy trans fats.  If you're thirsty, drink water. Coffee and tea are good in moderation, but skip sugary drinks and limit milk and dairy products to one or two daily servings.  The type of carbohydrate in the diet is more important than the amount. Some sources of carbohydrates, such as vegetables, fruits, whole grains, and beans-are healthier than others.  Finally, stay active.

## 2018-04-24 LAB — CBC WITH DIFFERENTIAL/PLATELET
BASOS ABS: 101 {cells}/uL (ref 0–200)
Basophils Relative: 1.6 %
EOS ABS: 554 {cells}/uL — AB (ref 15–500)
EOS PCT: 8.8 %
HCT: 42.1 % (ref 38.5–50.0)
Hemoglobin: 14.6 g/dL (ref 13.2–17.1)
Lymphs Abs: 1726 cells/uL (ref 850–3900)
MCH: 30.3 pg (ref 27.0–33.0)
MCHC: 34.7 g/dL (ref 32.0–36.0)
MCV: 87.3 fL (ref 80.0–100.0)
MONOS PCT: 10.6 %
MPV: 10.7 fL (ref 7.5–12.5)
NEUTROS PCT: 51.6 %
Neutro Abs: 3251 cells/uL (ref 1500–7800)
Platelets: 213 10*3/uL (ref 140–400)
RBC: 4.82 10*6/uL (ref 4.20–5.80)
RDW: 13 % (ref 11.0–15.0)
TOTAL LYMPHOCYTE: 27.4 %
WBC mixed population: 668 cells/uL (ref 200–950)
WBC: 6.3 10*3/uL (ref 3.8–10.8)

## 2018-04-24 LAB — COMPLETE METABOLIC PANEL WITH GFR
AG Ratio: 1.3 (calc) (ref 1.0–2.5)
ALKALINE PHOSPHATASE (APISO): 121 U/L — AB (ref 40–115)
ALT: 35 U/L (ref 9–46)
AST: 28 U/L (ref 10–35)
Albumin: 4.1 g/dL (ref 3.6–5.1)
BILIRUBIN TOTAL: 0.4 mg/dL (ref 0.2–1.2)
BUN: 17 mg/dL (ref 7–25)
CHLORIDE: 104 mmol/L (ref 98–110)
CO2: 29 mmol/L (ref 20–32)
Calcium: 9.1 mg/dL (ref 8.6–10.3)
Creat: 1.12 mg/dL (ref 0.70–1.18)
GFR, EST AFRICAN AMERICAN: 74 mL/min/{1.73_m2} (ref >=60)
GFR, Est Non African American: 63 mL/min/{1.73_m2} (ref >=60)
GLUCOSE: 96 mg/dL (ref 65–99)
Globulin: 3.1 g/dL (calc) (ref 1.9–3.7)
Potassium: 4.3 mmol/L (ref 3.5–5.3)
Sodium: 140 mmol/L (ref 135–146)
TOTAL PROTEIN: 7.2 g/dL (ref 6.1–8.1)

## 2018-04-24 LAB — HEMOGLOBIN A1C
EAG (MMOL/L): 6.8 (calc)
Hgb A1c MFr Bld: 5.9 % of total Hgb — ABNORMAL HIGH (ref <5.7)
MEAN PLASMA GLUCOSE: 123 (calc)

## 2018-04-24 LAB — LIPID PANEL
CHOLESTEROL: 143 mg/dL (ref <200)
HDL: 57 mg/dL (ref >40)
LDL CHOLESTEROL (CALC): 71 mg/dL
Non-HDL Cholesterol (Calc): 86 mg/dL (calc) (ref <130)
Total CHOL/HDL Ratio: 2.5 (calc) (ref <5.0)
Triglycerides: 69 mg/dL (ref <150)

## 2018-04-24 LAB — TSH: TSH: 1.09 mIU/L (ref 0.40–4.50)

## 2018-04-26 ENCOUNTER — Other Ambulatory Visit: Payer: Self-pay | Admitting: *Deleted

## 2018-04-26 MED ORDER — EZETIMIBE 10 MG PO TABS: 10.0000 mg | ORAL_TABLET | Freq: Every day | ORAL | 3 refills | Status: DC

## 2018-05-10 ENCOUNTER — Other Ambulatory Visit: Payer: Self-pay | Admitting: *Deleted

## 2018-05-10 MED ORDER — VERAPAMIL HCL ER 240 MG PO TBCR: 240.0000 mg | EXTENDED_RELEASE_TABLET | Freq: Every day | ORAL | 0 refills | Status: DC

## 2018-06-07 ENCOUNTER — Other Ambulatory Visit: Payer: Self-pay | Admitting: Internal Medicine

## 2018-06-11 ENCOUNTER — Other Ambulatory Visit: Payer: Self-pay | Admitting: *Deleted

## 2018-06-11 MED ORDER — VERAPAMIL HCL ER 240 MG PO TBCR: 240.0000 mg | EXTENDED_RELEASE_TABLET | Freq: Every day | ORAL | 0 refills | Status: DC

## 2018-06-13 ENCOUNTER — Other Ambulatory Visit: Payer: Self-pay | Admitting: Internal Medicine

## 2018-06-13 ENCOUNTER — Other Ambulatory Visit: Payer: Self-pay | Admitting: Adult Health

## 2018-06-13 DIAGNOSIS — E782 Mixed hyperlipidemia: Secondary | ICD-10-CM

## 2018-06-13 MED ORDER — ROSUVASTATIN CALCIUM 40 MG PO TABS: ORAL_TABLET | ORAL | 3 refills | Status: DC

## 2018-07-12 ENCOUNTER — Other Ambulatory Visit: Payer: Self-pay | Admitting: *Deleted

## 2018-07-12 MED ORDER — VERAPAMIL HCL ER 240 MG PO TBCR: 240.0000 mg | EXTENDED_RELEASE_TABLET | Freq: Every day | ORAL | 0 refills | Status: DC

## 2018-07-28 ENCOUNTER — Other Ambulatory Visit: Payer: Self-pay | Admitting: *Deleted

## 2018-07-28 ENCOUNTER — Encounter: Payer: Self-pay | Admitting: Internal Medicine

## 2018-07-28 ENCOUNTER — Ambulatory Visit: Payer: Medicare PPO | Admitting: Internal Medicine

## 2018-07-28 VITALS — BP 134/82 | HR 80 | Temp 97.5°F | Resp 16 | Ht 67.5 in | Wt 175.2 lb

## 2018-07-28 DIAGNOSIS — J4530 Mild persistent asthma, uncomplicated: Secondary | ICD-10-CM | POA: Diagnosis not present

## 2018-07-28 DIAGNOSIS — E559 Vitamin D deficiency, unspecified: Secondary | ICD-10-CM

## 2018-07-28 DIAGNOSIS — E782 Mixed hyperlipidemia: Secondary | ICD-10-CM

## 2018-07-28 DIAGNOSIS — I10 Essential (primary) hypertension: Secondary | ICD-10-CM

## 2018-07-28 DIAGNOSIS — R7303 Prediabetes: Secondary | ICD-10-CM

## 2018-07-28 DIAGNOSIS — R7309 Other abnormal glucose: Secondary | ICD-10-CM

## 2018-07-28 DIAGNOSIS — Z23 Encounter for immunization: Secondary | ICD-10-CM

## 2018-07-28 DIAGNOSIS — Z79899 Other long term (current) drug therapy: Secondary | ICD-10-CM | POA: Diagnosis not present

## 2018-07-28 MED ORDER — VERAPAMIL HCL ER 240 MG PO TBCR: 240.0000 mg | EXTENDED_RELEASE_TABLET | Freq: Every day | ORAL | 0 refills | Status: DC

## 2018-07-28 NOTE — Patient Instructions (Signed)
,Recommend Adult Low Dose Aspirin or  coated  Aspirin 81 mg daily  To reduce risk of Colon Cancer 20 %,  Skin Cancer 26 % ,  Melanoma 46%  and  Pancreatic cancer 60% +++++++++++++++++++++++++ Vitamin D goal  is between 70-100.  Please make sure that you are taking your Vitamin D as directed.  It is very important as a natural anti-inflammatory  helping hair, skin, and nails, as well as reducing stroke and heart attack risk.  It helps your bones and helps with mood. It also decreases numerous cancer risks so please take it as directed.  Low Vit D is associated with a 200-300% higher risk for CANCER  and 200-300% higher risk for HEART   ATTACK  &  STROKE.   ...................................... It is also associated with higher death rate at younger ages,  autoimmune diseases like Rheumatoid arthritis, Lupus, Multiple Sclerosis.    Also many other serious conditions, like depression, Alzheimer's Dementia, infertility, muscle aches, fatigue, fibromyalgia - just to name a few. ++++++++++++++++++++ Recommend the book "The END of DIETING" by Dr Joel Fuhrman  & the book "The END of DIABETES " by Dr Joel Fuhrman At [employer] - get book & Audio CD's    Being diabetic has a  300% increased risk for heart attack, stroke, cancer, and alzheimer- type vascular dementia. It is very important that you work harder with diet by avoiding all foods that are white. Avoid white rice (brown & wild rice is OK), white potatoes (sweetpotatoes in moderation is OK), White bread or wheat bread or anything made out of white flour like bagels, donuts, rolls, buns, biscuits, cakes, pastries, cookies, pizza crust, and pasta (made from white flour & egg whites) - vegetarian pasta or spinach or wheat pasta is OK. Multigrain breads like Arnold's or Pepperidge [employer], or multigrain sandwich thins or flatbreads.  Diet, exercise and weight loss can reverse and cure diabetes in the early stages.  Diet, exercise and weight loss is  very important in the control and prevention of complications of diabetes which affects every system in your body, ie. Brain - dementia/stroke, eyes - glaucoma/blindness, heart - heart attack/heart failure, kidneys - dialysis, stomach - gastric paralysis, intestines - malabsorption, nerves - severe painful neuritis, circulation - gangrene & loss of a leg(s), and finally cancer and Alzheimers.    I recommend avoid fried & greasy foods,  sweets/candy, white rice (brown or wild rice or Quinoa is OK), white potatoes (sweet potatoes are OK) - anything made from white flour - bagels, doughnuts, rolls, buns, biscuits,white and wheat breads, pizza crust and traditional pasta made of white flour & egg white(vegetarian pasta or spinach or wheat pasta is OK).  Multi-grain bread is OK - like multi-grain flat bread or sandwich thins. Avoid alcohol in excess. Exercise is also important.    Eat all the vegetables you want - avoid meat, especially red meat and dairy - especially cheese.  Cheese is the most concentrated form of trans-fats which is the worst thing to clog up our arteries. Veggie cheese is OK which can be found in the fresh produce section at [employer] or [employer] or [employer]  +++++++++++++++++++++ DASH Eating Plan  DASH stands for "Dietary Approaches to Stop Hypertension."   The DASH eating plan is a healthy eating plan that has been shown to reduce high blood pressure (hypertension). Additional health benefits may include reducing the risk of type 2 diabetes mellitus, heart disease, and stroke. The DASH eating plan may also   help with weight loss. WHAT DO I NEED TO KNOW ABOUT THE DASH EATING PLAN? For the DASH eating plan, you will follow these general guidelines:  Choose foods with a percent daily value for sodium of less than 5% (as listed on the food label).  Use salt-free seasonings or herbs instead of table salt or sea salt.  Check with your health care provider or pharmacist before  using salt substitutes.  Eat lower-sodium products, often labeled as "lower sodium" or "no salt added."  Eat fresh foods.  Eat more vegetables, fruits, and low-fat dairy products.  Choose whole grains. Look for the word "whole" as the first word in the ingredient list.  Choose fish   Limit sweets, desserts, sugars, and sugary drinks.  Choose heart-healthy fats.  Eat veggie cheese   Eat more home-cooked food and less restaurant, buffet, and fast food.  Limit fried foods.  Cook foods using methods other than frying.  Limit canned vegetables. If you do use them, rinse them well to decrease the sodium.  When eating at a restaurant, ask that your food be prepared with less salt, or no salt if possible.                      WHAT FOODS CAN I EAT? Read Dr Joel Fuhrman's books on The End of Dieting & The End of Diabetes  Grains Whole grain or whole wheat bread. Brown rice. Whole grain or whole wheat pasta. Quinoa, bulgur, and whole grain cereals. Low-sodium cereals. Corn or whole wheat flour tortillas. Whole grain cornbread. Whole grain crackers. Low-sodium crackers.  Vegetables Fresh or frozen vegetables (raw, steamed, roasted, or grilled). Low-sodium or reduced-sodium tomato and vegetable juices. Low-sodium or reduced-sodium tomato sauce and paste. Low-sodium or reduced-sodium canned vegetables.   Fruits All fresh, canned (in natural juice), or frozen fruits.  Protein Products  All fish and seafood.  Dried beans, peas, or lentils. Unsalted nuts and seeds. Unsalted canned beans.  Dairy Low-fat dairy products, such as skim or 1% milk, 2% or reduced-fat cheeses, low-fat ricotta or cottage cheese, or plain low-fat yogurt. Low-sodium or reduced-sodium cheeses.  Fats and Oils Tub margarines without trans fats. Light or reduced-fat mayonnaise and salad dressings (reduced sodium). Avocado. Safflower, olive, or canola oils. Natural peanut or almond butter.  Other Unsalted popcorn  and pretzels. The items listed above may not be a complete list of recommended foods or beverages. Contact your dietitian for more options.  +++++++++++++++  WHAT FOODS ARE NOT RECOMMENDED? Grains/ White flour or wheat flour White bread. White pasta. White rice. Refined cornbread. Bagels and croissants. Crackers that contain trans fat.  Vegetables  Creamed or fried vegetables. Vegetables in a . Regular canned vegetables. Regular canned tomato sauce and paste. Regular tomato and vegetable juices.  Fruits Dried fruits. Canned fruit in light or heavy syrup. Fruit juice.  Meat and Other Protein Products Meat in general - RED meat & White meat.  Fatty cuts of meat. Ribs, chicken wings, all processed meats as bacon, sausage, bologna, salami, fatback, hot dogs, bratwurst and packaged luncheon meats.  Dairy Whole or 2% milk, cream, half-and-half, and cream cheese. Whole-fat or sweetened yogurt. Full-fat cheeses or blue cheese. Non-dairy creamers and whipped toppings. Processed cheese, cheese spreads, or cheese curds.  Condiments Onion and garlic salt, seasoned salt, table salt, and sea salt. Canned and packaged gravies. Worcestershire sauce. Tartar sauce. Barbecue sauce. Teriyaki sauce. Soy sauce, including reduced sodium. Steak sauce. Fish sauce. Oyster sauce. Cocktail   sauce. Horseradish. Ketchup and mustard. Meat flavorings and tenderizers. Bouillon cubes. Hot sauce. Tabasco sauce. Marinades. Taco seasonings. Relishes.  Fats and Oils Butter, stick margarine, lard, shortening and bacon fat. Coconut, palm kernel, or palm oils. Regular salad dressings.  Pickles and olives. Salted popcorn and pretzels.  The items listed above may not be a complete list of foods and beverages to avoid.   

## 2018-07-28 NOTE — Progress Notes (Signed)
This very nice 77 y.o. MWM presents for 6 month follow up with HTN, HLD, Pre-Diabetes and Vitamin D Deficiency. Patient has remote hx/o Colon Ca (2004).      Patient is treated for HTN (1996) & BP has been controlled at home. Today's BP was initially elevated & rechecked at goal - 134/82. Patient has had no complaints of any cardiac type chest pain, palpitations, dyspnea / orthopnea / PND, dizziness, claudication, or dependent edema.     Hyperlipidemia is controlled with diet & meds. Patient denies myalgias or other med SE's. Current Lipids are at goal: Lab Results  Component Value Date   CHOL 128 07/28/2018   HDL 58 07/28/2018   LDLCALC 55 07/28/2018   TRIG 73 07/28/2018   CHOLHDL 2.2 07/28/2018      Also, the patient has history of PreDiabetes (A1c 6.3%/2013)  and has had no symptoms of reactive hypoglycemia, diabetic polys, paresthesias or visual blurring.  Current A1c is not at goal: Lab Results  Component Value Date   HGBA1C 6.2 (H) 07/28/2018      Further, the patient also has history of Vitamin D Deficiency ("24"/2008)  and supplements vitamin D without any suspected side-effects. Last vitamin D was 98 in Feb 2019 and at goal.   Current Outpatient Medications on File Prior to Visit  Medication Sig  . cetirizine (ZYRTEC) 10 MG tablet TAKE 1 TABLET DAILY FOR ALLERGIES  . ezetimibe (ZETIA) 10 MG tablet Take 1 tablet (10 mg total) by mouth daily.  . Fluticasone-Salmeterol (ADVAIR DISKUS) 250-50 MCG/DOSE AEPB Inhale 1 puff 2 (two) times daily into the lungs.  . Ipratropium-Albuterol (COMBIVENT RESPIMAT) 20-100 MCG/ACT AERS respimat Inhale 1 puff into the lungs every 4 (four) hours as needed for wheezing.  Marland Kitchen losartan (COZAAR) 50 MG tablet TAKE 1 TABLET DAILY FOR BLOOD PRESSURE  . montelukast (SINGULAIR) 10 MG tablet Take 1 tablet daily for Allergy & Asthma  . rosuvastatin (CRESTOR) 40 MG tablet Take 1/2 to 1 tablet daily or as directed for Cholesterol  . Vitamin D,  Ergocalciferol, (DRISDOL) 50000 units CAPS capsule TAKE 1 CAPSULE DAILY FOR SEVERE VITAMIN D DEFICIENCY   No current facility-administered medications on file prior to visit.    Allergies  Allergen Reactions  . Mavik [Trandolapril]     Cough  . Hctz [Hydrochlorothiazide] Rash    Photosensitivity   PMHx:   Past Medical History:  Diagnosis Date  . Allergy   . Asthma   . BPH (benign prostatic hyperplasia)   . GERD (gastroesophageal reflux disease)   . History of colon cancer   . Hyperlipidemia   . Hypertension   . Prediabetes   . Vitamin D deficiency    Immunization History  Administered Date(s) Administered  . DT 07/10/2009  . Influenza Split 07/28/2013  . Influenza, High Dose Seasonal PF 09/18/2014, 07/26/2015, 08/18/2016, 07/13/2017, 07/28/2018  . Pneumococcal Conjugate-13 02/07/2016  . Pneumococcal Polysaccharide-23 02/22/2007   Past Surgical History:  Procedure Laterality Date  . COLON SURGERY  2004   Dr Rosana Hoes   FHx:    Reviewed / unchanged  SHx:    Reviewed / unchanged   Systems Review:  Constitutional: Denies fever, chills, wt changes, headaches, insomnia, fatigue, night sweats, change in appetite. Eyes: Denies redness, blurred vision, diplopia, discharge, itchy, watery eyes.  ENT: Denies discharge, congestion, post nasal drip, epistaxis, sore throat, earache, hearing loss, dental pain, tinnitus, vertigo, sinus pain, snoring.  CV: Denies chest pain, palpitations, irregular heartbeat, syncope, dyspnea, diaphoresis,  orthopnea, PND, claudication or edema. Respiratory: denies cough, dyspnea, DOE, pleurisy, hoarseness, laryngitis, wheezing.  Gastrointestinal: Denies dysphagia, odynophagia, heartburn, reflux, water brash, abdominal pain or cramps, nausea, vomiting, bloating, diarrhea, constipation, hematemesis, melena, hematochezia  or hemorrhoids. Genitourinary: Denies dysuria, frequency, urgency, nocturia, hesitancy, discharge, hematuria or flank  pain. Musculoskeletal: Denies arthralgias, myalgias, stiffness, jt. swelling, pain, limping or strain/sprain.  Skin: Denies pruritus, rash, hives, warts, acne, eczema or change in skin lesion(s). Neuro: No weakness, tremor, incoordination, spasms, paresthesia or pain. Psychiatric: Denies confusion, memory loss or sensory loss. Endo: Denies change in weight, skin or hair change.  Heme/Lymph: No excessive bleeding, bruising or enlarged lymph nodes.  Physical Exam  BP 134/82   Pulse 80   Temp (!) 97.5 F (36.4 C)   Resp 16   Ht 5' 7.5" (1.715 m)   Wt 175 lb 3.2 oz (79.5 kg)   BMI 27.04 kg/m   Appears  well nourished, well groomed  and in no distress.  Eyes: PERRLA, EOMs, conjunctiva no swelling or erythema. Sinuses: No frontal/maxillary tenderness ENT/Mouth: EAC's clear, TM's nl w/o erythema, bulging. Nares clear w/o erythema, swelling, exudates. Oropharynx clear without erythema or exudates. Oral hygiene is good. Tongue normal, non obstructing. Hearing intact.  Neck: Supple. Thyroid not palpable. Car 2+/2+ without bruits, nodes or JVD. Chest: Respirations nl with BS clear & equal w/o rales, rhonchi, wheezing or stridor.  Cor: Heart sounds normal w/ regular rate and rhythm without sig. murmurs, gallops, clicks or rubs. Peripheral pulses normal and equal  without edema.  Abdomen: Soft & bowel sounds normal. Non-tender w/o guarding, rebound, hernias, masses or organomegaly.  Lymphatics: Unremarkable.  Musculoskeletal: Full ROM all peripheral extremities, joint stability, 5/5 strength and normal gait.  Skin: Warm, dry without exposed rashes, lesions or ecchymosis apparent.  Neuro: Cranial nerves intact, reflexes equal bilaterally. Sensory-motor testing grossly intact. Tendon reflexes grossly intact.  Pysch: Alert & oriented x 3.  Insight and judgement nl & appropriate. No ideations.  Assessment and Plan:  1. Essential hypertension  - Continue medication, monitor blood pressure at  home.  - Continue DASH diet.  Reminder to go to the ER if any CP,  SOB, nausea, dizziness, severe HA, changes vision/speech.  - CBC with Differential/Platelet - COMPLETE METABOLIC PANEL WITH GFR - Magnesium - TSH  2. Hyperlipidemia, mixed  - Continue diet/meds, exercise,& lifestyle modifications.  - Continue monitor periodic cholesterol/liver & renal functions   - Lipid panel - TSH  3. Abnormal glucose  - Continue diet, exercise, lifestyle modifications.  - Monitor appropriate labs.  - Hemoglobin A1c - Insulin, random  4. Vitamin D deficiency  - Continue supplementation.  - VITAMIN D 25 Hydroxyl  5. Prediabetes  - Hemoglobin A1c - Insulin, random  6. Mild persistent asthma without complication   7. Medication management  - CBC with Differential/Platelet - COMPLETE METABOLIC PANEL WITH GFR - Magnesium - Lipid panel - TSH - Hemoglobin A1c - Insulin, random - VITAMIN D 25 Hydroxyl  8. Need for prophylactic vaccination and inoculation against influenza  - Flu vaccine HIGH DOSE PF (Fluzone High dose)       Discussed  regular exercise, BP monitoring, weight control to achieve/maintain BMI less than 25 and discussed med and SE's. Recommended labs to assess and monitor clinical status with further disposition pending results of labs. Over 30 minutes of exam, counseling, chart review was performed.

## 2018-07-29 LAB — MAGNESIUM: MAGNESIUM: 2.1 mg/dL (ref 1.5–2.5)

## 2018-07-29 LAB — CBC WITH DIFFERENTIAL/PLATELET
BASOS ABS: 90 {cells}/uL (ref 0–200)
Basophils Relative: 1.5 %
EOS PCT: 6.3 %
Eosinophils Absolute: 378 cells/uL (ref 15–500)
HCT: 40.8 % (ref 38.5–50.0)
HEMOGLOBIN: 13.3 g/dL (ref 13.2–17.1)
LYMPHS ABS: 1494 {cells}/uL (ref 850–3900)
MCH: 29.5 pg (ref 27.0–33.0)
MCHC: 32.6 g/dL (ref 32.0–36.0)
MCV: 90.5 fL (ref 80.0–100.0)
MPV: 11.2 fL (ref 7.5–12.5)
Monocytes Relative: 11.5 %
NEUTROS ABS: 3348 {cells}/uL (ref 1500–7800)
Neutrophils Relative %: 55.8 %
Platelets: 189 10*3/uL (ref 140–400)
RBC: 4.51 10*6/uL (ref 4.20–5.80)
RDW: 13.2 % (ref 11.0–15.0)
Total Lymphocyte: 24.9 %
WBC mixed population: 690 cells/uL (ref 200–950)
WBC: 6 10*3/uL (ref 3.8–10.8)

## 2018-07-29 LAB — COMPLETE METABOLIC PANEL WITH GFR
AG Ratio: 1.4 (calc) (ref 1.0–2.5)
ALBUMIN MSPROF: 4.1 g/dL (ref 3.6–5.1)
ALKALINE PHOSPHATASE (APISO): 91 U/L (ref 40–115)
ALT: 30 U/L (ref 9–46)
AST: 27 U/L (ref 10–35)
BILIRUBIN TOTAL: 0.5 mg/dL (ref 0.2–1.2)
BUN: 17 mg/dL (ref 7–25)
CHLORIDE: 104 mmol/L (ref 98–110)
CO2: 29 mmol/L (ref 20–32)
CREATININE: 0.97 mg/dL (ref 0.70–1.18)
Calcium: 9 mg/dL (ref 8.6–10.3)
GFR, Est African American: 87 mL/min/{1.73_m2} (ref >=60)
GFR, Est Non African American: 75 mL/min/{1.73_m2} (ref >=60)
Globulin: 2.9 g/dL (calc) (ref 1.9–3.7)
Glucose, Bld: 97 mg/dL (ref 65–99)
Potassium: 4 mmol/L (ref 3.5–5.3)
Sodium: 139 mmol/L (ref 135–146)
Total Protein: 7 g/dL (ref 6.1–8.1)

## 2018-07-29 LAB — HEMOGLOBIN A1C
HEMOGLOBIN A1C: 6.2 %{Hb} — AB (ref <5.7)
Mean Plasma Glucose: 131 (calc)
eAG (mmol/L): 7.3 (calc)

## 2018-07-29 LAB — LIPID PANEL
CHOLESTEROL: 128 mg/dL (ref <200)
HDL: 58 mg/dL (ref >40)
LDL CHOLESTEROL (CALC): 55 mg/dL
Non-HDL Cholesterol (Calc): 70 mg/dL (calc) (ref <130)
TRIGLYCERIDES: 73 mg/dL (ref <150)
Total CHOL/HDL Ratio: 2.2 (calc) (ref <5.0)

## 2018-07-29 LAB — VITAMIN D 25 HYDROXY (VIT D DEFICIENCY, FRACTURES): Vit D, 25-Hydroxy: 119 ng/mL — ABNORMAL HIGH (ref 30–100)

## 2018-07-29 LAB — INSULIN, RANDOM: Insulin: 5.7 u[IU]/mL (ref 2.0–19.6)

## 2018-07-29 LAB — TSH: TSH: 0.98 m[IU]/L (ref 0.40–4.50)

## 2018-07-31 ENCOUNTER — Encounter: Payer: Self-pay | Admitting: Internal Medicine

## 2018-08-09 ENCOUNTER — Other Ambulatory Visit: Payer: Self-pay | Admitting: *Deleted

## 2018-08-09 MED ORDER — VERAPAMIL HCL ER 240 MG PO TBCR: 240.0000 mg | EXTENDED_RELEASE_TABLET | Freq: Every day | ORAL | 2 refills | Status: DC

## 2018-09-15 ENCOUNTER — Other Ambulatory Visit: Payer: Self-pay | Admitting: Internal Medicine

## 2018-09-15 MED ORDER — VERAPAMIL HCL ER 240 MG PO TBCR: 240.0000 mg | EXTENDED_RELEASE_TABLET | Freq: Every day | ORAL | 1 refills | Status: DC

## 2018-10-20 ENCOUNTER — Other Ambulatory Visit: Payer: Self-pay | Admitting: Adult Health

## 2018-10-20 DIAGNOSIS — J4530 Mild persistent asthma, uncomplicated: Secondary | ICD-10-CM

## 2018-10-27 NOTE — Progress Notes (Signed)
FOLLOW UP  Assessment and Plan:   Hypertension He has not taken his BP meds today and presents with elevated value; encouraged for him not to skip these esp on days he is coming in to be evaluated. Will bring back for recheck in 1 week.  Monitor blood pressure at home; patient to call if consistently greater than 130/80 Continue DASH diet.   Reminder to go to the ER if any CP, SOB, nausea, dizziness, severe HA, changes vision/speech, left arm numbness and tingling and jaw pain.  Cholesterol Continue medications  Continue low cholesterol diet and exercise.  Check lipid panel.   Prediabetes Continue diet and exercise.  Perform daily foot/skin check, notify office of any concerning changes.  Check A1C  Vitamin D Def/ osteoporosis prevention Continue supplementation Check Vit D level  Asthma, mild, persistant He reports asthma with increased symptoms in the fall and winter seasons; well managed with current regimen when he does take his medications.  Continue diet and meds as discussed. Further disposition pending results of labs. Discussed med's effects and SE's.   Over 30 minutes of exam, counseling, chart review, and critical decision making was performed.   Future Appointments  Date Time Provider [place]  01/27/2019  9:30 AM Unk Pinto, MD [facility] None  04/25/2019  9:00 AM Liane Comber, NP [facility] None    ----------------------------------------------------------------------------------------------------------------------  HPI 77 y.o. male  presents for 3 month follow up on hypertension, cholesterol, prediabetes and vitamin D deficiency.    He reports increased AM secretions and albuterol use 1-2 times per week during the fall/winter seasons as he typically does. He reports he has not taken any of his medications today.   BMI is Body mass index is 26.97 kg/m., he has been working on diet and exercise. Wt Readings from Last 3 Encounters:  10/28/18  174 lb 12.8 oz (79.3 kg)  07/28/18 175 lb 3.2 oz (79.5 kg)  04/23/18 179 lb (81.2 kg)   His blood pressure has been controlled at home (120s/80s), today their BP is BP: 130/82   He does workout. He denies chest pain, shortness of breath, dizziness.   He is on cholesterol medication and denies myalgias. His cholesterol is at goal. The cholesterol last visit was:   Lab Results  Component Value Date   CHOL 128 07/28/2018   HDL 58 07/28/2018   LDLCALC 55 07/28/2018   TRIG 73 07/28/2018   CHOLHDL 2.2 07/28/2018    He has been working on diet and exercise for prediabetes, and denies increased appetite, nausea, paresthesia of the feet, polydipsia, polyuria, visual disturbances and vomiting. Last A1C in the office was:  Lab Results  Component Value Date   HGBA1C 6.2 (H) 07/28/2018   Patient is on Vitamin D supplement and at goal:    Lab Results  Component Value Date   VD25OH 119 (H) 07/28/2018        Current Medications:  Current Outpatient Medications on File Prior to Visit  Medication Sig  . ADVAIR DISKUS 250-50 MCG/DOSE AEPB USE 1 INHALATION TWICE A DAY  . cetirizine (ZYRTEC) 10 MG tablet TAKE 1 TABLET DAILY FOR ALLERGIES  . ezetimibe (ZETIA) 10 MG tablet Take 1 tablet (10 mg total) by mouth daily.  . Ipratropium-Albuterol (COMBIVENT RESPIMAT) 20-100 MCG/ACT AERS respimat Inhale 1 puff into the lungs every 4 (four) hours as needed for wheezing.  Marland Kitchen losartan (COZAAR) 50 MG tablet TAKE 1 TABLET DAILY FOR BLOOD PRESSURE  . montelukast (SINGULAIR) 10 MG tablet Take 1 tablet  daily for Allergy & Asthma  . rosuvastatin (CRESTOR) 40 MG tablet Take 1/2 to 1 tablet daily or as directed for Cholesterol  . verapamil (CALAN-SR) 240 MG CR tablet Take 1 tablet (240 mg total) by mouth daily. with food  . Vitamin D, Ergocalciferol, (DRISDOL) 50000 units CAPS capsule TAKE 1 CAPSULE DAILY FOR SEVERE VITAMIN D DEFICIENCY   No current facility-administered medications on file prior to visit.       Allergies:  Allergies  Allergen Reactions  . Mavik [Trandolapril]     Cough  . Hctz [Hydrochlorothiazide] Rash    Photosensitivity     Medical History:  Past Medical History:  Diagnosis Date  . Allergy   . Asthma   . BPH (benign prostatic hyperplasia)   . GERD (gastroesophageal reflux disease)   . History of colon cancer   . Hyperlipidemia   . Hypertension   . Prediabetes   . Vitamin D deficiency    Family history- Reviewed and unchanged Social history- Reviewed and unchanged   Review of Systems:  Review of Systems  Constitutional: Negative.   HENT: Negative.   Eyes: Negative.   Respiratory: Negative.   Cardiovascular: Negative.   Gastrointestinal: Negative.   Genitourinary: Negative.   Musculoskeletal: Negative.   Skin: Negative.   Neurological: Negative.   Endo/Heme/Allergies: Negative.   Psychiatric/Behavioral: Negative.       Physical Exam: BP 130/82   Pulse 73   Temp 98.7 F (37.1 C)   Ht 5' 7.5" (1.715 m)   Wt 174 lb 12.8 oz (79.3 kg)   SpO2 98%   BMI 26.97 kg/m  Wt Readings from Last 3 Encounters:  10/28/18 174 lb 12.8 oz (79.3 kg)  07/28/18 175 lb 3.2 oz (79.5 kg)  04/23/18 179 lb (81.2 kg)   General Appearance: Well nourished, in no apparent distress. Eyes: PERRLA, EOMs, conjunctiva no swelling or erythema Sinuses: No Frontal/maxillary tenderness ENT/Mouth: Ext aud canals clear, TMs without erythema, bulging. No erythema, swelling, or exudate on post pharynx.  Tonsils not swollen or erythematous. Hearing normal.  Neck: Supple, thyroid normal.  Respiratory: Respiratory effort normal, BS - Left side with coarse expiratory wheezing, Right side clear throughout  without rales, rhonchi, wheezing or stridor.  Cardio: RRR with no MRGs. Brisk peripheral pulses without edema.  Abdomen: Soft, + BS.  Non tender, no guarding, rebound, hernias, masses. Lymphatics: Non tender without lymphadenopathy.  Musculoskeletal: Full ROM, 5/5 strength,  Normal gait Skin: Warm, dry without rashes, lesions, ecchymosis.  Neuro: Cranial nerves intact. No cerebellar symptoms.  Psych: Awake and oriented X 3, normal affect, Insight and Judgment appropriate.    Vicie Mutters, PA-C 9:34 AM [facility]

## 2018-10-28 ENCOUNTER — Ambulatory Visit (INDEPENDENT_AMBULATORY_CARE_PROVIDER_SITE_OTHER): Payer: Medicare PPO | Admitting: Physician Assistant

## 2018-10-28 ENCOUNTER — Encounter: Payer: Self-pay | Admitting: Physician Assistant

## 2018-10-28 VITALS — BP 130/82 | HR 73 | Temp 98.7°F | Ht 67.5 in | Wt 174.8 lb

## 2018-10-28 DIAGNOSIS — E782 Mixed hyperlipidemia: Secondary | ICD-10-CM | POA: Diagnosis not present

## 2018-10-28 DIAGNOSIS — Z79899 Other long term (current) drug therapy: Secondary | ICD-10-CM

## 2018-10-28 DIAGNOSIS — I10 Essential (primary) hypertension: Secondary | ICD-10-CM | POA: Diagnosis not present

## 2018-10-28 DIAGNOSIS — E559 Vitamin D deficiency, unspecified: Secondary | ICD-10-CM

## 2018-10-28 DIAGNOSIS — R7303 Prediabetes: Secondary | ICD-10-CM | POA: Diagnosis not present

## 2018-10-28 NOTE — Patient Instructions (Addendum)
WATER IS IMPORTANT  Being dehydrated can hurt your kidneys, cause fatigue, headaches, muscle aches, joint pain, and dry skin/nails so please increase your fluids.   Drink 80-100 oz a day of water, measure it out!  Can check out plantnanny app on your phone to help you keep track of your water      Fluticasone; Salmeterol inhalation powder What is this medicine? FLUTICASONE; SALMETEROL (floo TIK a sone; sal ME te role) inhalation is a combination of two medicines that decrease inflammation and help to open up the airways of your lungs. It is used to treat COPD. This medicine is also used to treat asthma. Do NOT use for an acute asthma attack. Do NOT use for a COPD attack.  THIS MEDICATION IS MAINTENANCE, USE IT TWICE A DAY EVERY DAY  COMMON BRAND NAME(S): Advair, Airduo RespiClick What should I tell my health care provider before I take this medicine? They need to know if you have any of these conditions: -bone problems -diabetes -eye disease, vision problems -immune system problems -heart disease or irregular heartbeat -high blood pressure -infection -pheochromocytoma -seizures -thyroid disease -worsening asthma -an unusual or allergic reaction to fluticasone; salmeterol, other corticosteroids, other medicines, foods, dyes, or preservatives -pregnant or trying to get pregnant -breast-feeding How should I use this medicine? This medicine is inhaled through the mouth. Rinse your mouth with water after use. Make sure not to swallow the water. Follow the directions on the prescription label. Do not use a spacer device with this inhaler. Take your medicine at regular intervals. Do not take your medicine more often than directed. Do not stop taking except on your doctor's advice. Make sure that you are using your inhaler correctly. Ask you doctor or health care provider if you have any questions. A special MedGuide will be given to you by the pharmacist with each prescription and  refill. Be sure to read this information carefully each time. Talk to your pediatrician regarding the use of this medicine in children. Special care may be needed. Overdosage: If you think you have taken too much of this medicine contact a poison control center or emergency room at once. NOTE: This medicine is only for you. Do not share this medicine with others. What if I miss a dose? If you miss a dose, use it as soon as you remember. If it is almost time for your next dose, use only that dose and continue with your regular schedule, spacing doses evenly. Do not use double or extra doses. What may interact with this medicine? Do not take this medicine with any of the following medications: -MAOIs like Carbex, Eldepryl, Marplan, Nardil, and Parnate This medicine may also interact with the following medications: -aminophylline or theophylline -antiviral medicines for HIV or AIDS -beta-blockers like metoprolol and propranolol -certain antibiotics like clarithromycin, erythromycin, levofloxacin, linezolid, and telithromycin -certain medicines for fungal infections like ketoconazole, itraconazole, posaconazole, voriconazole -conivaptan -diuretics -medicines for colds -medicines for depression or emotional conditions -nefazodone -vaccines This list may not describe all possible interactions. Give your health care provider a list of all the medicines, herbs, non-prescription drugs, or dietary supplements you use. Also tell them if you smoke, drink alcohol, or use illegal drugs. Some items may interact with your medicine. What should I watch for while using this medicine? Visit your doctor for regular check ups. Tell your doctor or health care professional if your symptoms do not get better. Do not use this medicine more than every 12 hours. NEVER use this  medicine for an acute asthma attack. You should use your short-acting rescue inhalers for this purpose. If your symptoms get worse or if you  need your short-acting inhalers more often, call your doctor right away. If you are going to have surgery tell your doctor or health care professional that you are using this medicine. Try not to come in contact with people with the chicken pox or measles. If you do, call your doctor. What side effects may I notice from receiving this medicine? Side effects that you should report to your doctor or health care professional as soon as possible: -allergic reactions like skin rash or hives, swelling of the face, lips, or tongue -breathing problems right after inhaling your medicine -changes in vision -chest pain -fast, irregular heartbeat -feeling faint or lightheaded, falls -fever or chills -nausea, vomiting -tiredness Side effects that usually do not require medical attention (report to your doctor or health care professional if they continue or are bothersome): -cough -headache -nervousness -sore throat -stuffy nose -tremor This list may not describe all possible side effects. Call your doctor for medical advice about side effects. You may report side effects to FDA at 1-800-FDA-1088. Where should I keep my medicine? Keep out of the reach of children. Advair: Store at room temperature between 68 and 77 degrees F (20 and 25 degrees C). Do not leave your medicine in the heat or sun. Throw away 1 month after you open the package or whenever the dose indicator reads 0, whichever comes first. Throw away unopened packages after the expiration date. Airduo Respiclick: Store at room temperature between 59 and 86 degrees F (15 and 30 degrees C). Avoid exposure to extreme heat, cold, or humidity. Throw away 1 month after you open the package or whenever the dose indicator reads 0, whichever comes first. Throw away unopened packages after the expiration date. NOTE: This sheet is a summary. It may not cover all possible information. If you have questions about this medicine, talk to your doctor,  pharmacist, or health care provider.  2019 Elsevier/Gold Standard (2017-11-12 14:10:41)

## 2018-10-29 LAB — VITAMIN D 25 HYDROXY (VIT D DEFICIENCY, FRACTURES): Vit D, 25-Hydroxy: 87 ng/mL (ref 30–100)

## 2018-10-29 LAB — LIPID PANEL
CHOLESTEROL: 140 mg/dL (ref <200)
HDL: 64 mg/dL (ref >40)
LDL Cholesterol (Calc): 61 mg/dL (calc)
NON-HDL CHOLESTEROL (CALC): 76 mg/dL (ref <130)
TRIGLYCERIDES: 69 mg/dL (ref <150)
Total CHOL/HDL Ratio: 2.2 (calc) (ref <5.0)

## 2018-10-29 LAB — COMPLETE METABOLIC PANEL WITH GFR
AG Ratio: 1.4 (calc) (ref 1.0–2.5)
ALT: 29 U/L (ref 9–46)
AST: 26 U/L (ref 10–35)
Albumin: 4.3 g/dL (ref 3.6–5.1)
Alkaline phosphatase (APISO): 107 U/L (ref 40–115)
BUN: 17 mg/dL (ref 7–25)
CALCIUM: 9 mg/dL (ref 8.6–10.3)
CO2: 30 mmol/L (ref 20–32)
CREATININE: 1.03 mg/dL (ref 0.70–1.18)
Chloride: 104 mmol/L (ref 98–110)
GFR, EST AFRICAN AMERICAN: 81 mL/min/{1.73_m2} (ref >=60)
GFR, Est Non African American: 70 mL/min/{1.73_m2} (ref >=60)
Globulin: 3.1 g/dL (calc) (ref 1.9–3.7)
Glucose, Bld: 91 mg/dL (ref 65–99)
Potassium: 4.2 mmol/L (ref 3.5–5.3)
Sodium: 142 mmol/L (ref 135–146)
TOTAL PROTEIN: 7.4 g/dL (ref 6.1–8.1)
Total Bilirubin: 0.4 mg/dL (ref 0.2–1.2)

## 2018-10-29 LAB — CBC WITH DIFFERENTIAL/PLATELET
ABSOLUTE MONOCYTES: 540 {cells}/uL (ref 200–950)
Basophils Absolute: 108 cells/uL (ref 0–200)
Basophils Relative: 2 %
EOS PCT: 9.9 %
Eosinophils Absolute: 535 cells/uL — ABNORMAL HIGH (ref 15–500)
HCT: 42.8 % (ref 38.5–50.0)
Hemoglobin: 13.9 g/dL (ref 13.2–17.1)
Lymphs Abs: 1377 cells/uL (ref 850–3900)
MCH: 29.5 pg (ref 27.0–33.0)
MCHC: 32.5 g/dL (ref 32.0–36.0)
MCV: 90.9 fL (ref 80.0–100.0)
MPV: 10.2 fL (ref 7.5–12.5)
Monocytes Relative: 10 %
Neutro Abs: 2840 cells/uL (ref 1500–7800)
Neutrophils Relative %: 52.6 %
PLATELETS: 205 10*3/uL (ref 140–400)
RBC: 4.71 10*6/uL (ref 4.20–5.80)
RDW: 12.9 % (ref 11.0–15.0)
TOTAL LYMPHOCYTE: 25.5 %
WBC: 5.4 10*3/uL (ref 3.8–10.8)

## 2018-10-29 LAB — HEMOGLOBIN A1C
Hgb A1c MFr Bld: 6 % of total Hgb — ABNORMAL HIGH (ref <5.7)
Mean Plasma Glucose: 126 (calc)
eAG (mmol/L): 7 (calc)

## 2018-10-29 LAB — TSH: TSH: 1.17 mIU/L (ref 0.40–4.50)

## 2018-12-07 ENCOUNTER — Other Ambulatory Visit: Payer: Self-pay

## 2018-12-07 MED ORDER — VERAPAMIL HCL ER 240 MG PO TBCR: 240.0000 mg | EXTENDED_RELEASE_TABLET | Freq: Every day | ORAL | 1 refills | Status: DC

## 2018-12-08 ENCOUNTER — Other Ambulatory Visit: Payer: Self-pay | Admitting: Internal Medicine

## 2018-12-22 ENCOUNTER — Other Ambulatory Visit: Payer: Self-pay | Admitting: Adult Health

## 2019-01-17 ENCOUNTER — Encounter: Payer: Self-pay | Admitting: Internal Medicine

## 2019-01-26 NOTE — Progress Notes (Signed)
[facility]   Unk Pinto, M.D.     [name]. [name], P.A.-C Liane Com[address], [address]                [place], [place] [address] Telephone [phone] Telefax [phone] Annual  Screening/Preventative Visit  & Comprehensive Evaluation & Examination     This very nice 78 y.o. married 23 man  presents for a Screening /Preventative Visit & comprehensive evaluation and management of multiple medical co-morbidities.  Patient has been followed for HTN, HLD, Prediabetes and Vitamin D Deficiency. In 2004,  patient had resection of a colon cancer.     HTN predates since 1996. Patient's BP has been controlled at home.  Today's BP is at goal - 136/74. Patient denies any cardiac symptoms as chest pain, palpitations, shortness of breath, dizziness or ankle swelling. Patient also has hx/o Allergic Asthma.     Patient's hyperlipidemia is controlled with diet and Rosuvastatin. Patient denies myalgias or other medication SE's. Last lipids were at goal: Lab Results  Component Value Date   CHOL 140 10/28/2018   HDL 64 10/28/2018   LDLCALC 61 10/28/2018   TRIG 69 10/28/2018   CHOLHDL 2.2 10/28/2018      Patient has hx/o prediabetes (A1c 6.3% / 2013) and patient denies reactive hypoglycemic symptoms, visual blurring, diabetic polys or paresthesias. Last A1c was not at goal: Lab Results  Component Value Date   HGBA1C 6.0 (H) 10/28/2018       Finally, patient has history of Vitamin D Deficiency ("24" / 2008)  and last vitamin D was at goal: Lab Results  Component Value Date   VD25OH 87 10/28/2018   Current Outpatient Medications on File Prior to Visit  Medication Sig  . ADVAIR DISKUS 250-50 MCG/DOSE AEPB USE 1 INHALATION TWICE A DAY  . cetirizine (ZYRTEC) 10 MG tablet TAKE 1 TABLET DAILY FOR ALLERGIES  . ezetimibe (ZETIA) 10 MG tablet Take 1 tablet (10 mg total) by mouth daily.  .  Ipratropium-Albuterol (COMBIVENT RESPIMAT) 20-100 MCG/ACT AERS respimat Inhale 1 puff into the lungs every 4 (four) hours as needed for wheezing.  Marland Kitchen losartan (COZAAR) 50 MG tablet TAKE 1 TABLET DAILY FOR BLOOD PRESSURE  . montelukast (SINGULAIR) 10 MG tablet TAKE 1 TABLET DAILY FOR ALLERGY AND ASTHMA  . rosuvastatin (CRESTOR) 40 MG tablet Take 1/2 to 1 tablet daily or as directed for Cholesterol  . verapamil (CALAN-SR) 240 MG CR tablet Take 1 tablet (240 mg total) by mouth daily. with food  . Vitamin D, Ergocalciferol, (DRISDOL) 50000 units CAPS capsule TAKE 1 CAPSULE DAILY FOR SEVERE VITAMIN D DEFICIENCY   No current facility-administered medications on file prior to visit.    Allergies  Allergen Reactions  . Mavik [Trandolapril]     Cough  . Hctz [Hydrochlorothiazide] Rash    Photosensitivity   Past Medical History:  Diagnosis Date  . Allergy   . Asthma   . BPH (benign prostatic hyperplasia)   . GERD (gastroesophageal reflux disease)   . History of colon cancer   . Hyperlipidemia   . Hypertension   . Prediabetes   . Vitamin D deficiency    Health Maintenance  Topic Date Due  . TETANUS/TDAP  04/20/2019  . INFLUENZA VACCINE  Completed  . PNA vac Low Risk Adult  Completed   Immunization History  Administered Date(s) Administered  .  DT 07/10/2009  . Influenza Split 07/28/2013  . Influenza, High Dose Seasonal PF 09/18/2014, 07/26/2015, 08/18/2016, 07/13/2017, 07/28/2018  . Pneumococcal Conjugate-13 02/07/2016  . Pneumococcal Polysaccharide-23 02/22/2007   Last Colonoscopy 03/18/2019 - Dr Earlean Shawl recc 5 year f/u May 2022  Past Surgical History:  Procedure Laterality Date  . COLON SURGERY  2004   Dr Rosana Hoes   Family History  Problem Relation Age of Onset  . Heart disease Mother   . Cancer Father   . Cancer Sister   . Autoimmune disease Brother    Social History   Socioeconomic History  . Marital status: Married    Spouse name: Manuela Schwartz  . Number of children: 2   Occupational History  . Semi-Retired, works part-time cooking at [employer]  . Smoking status: Never Smoker  . Smokeless tobacco: Never Used  Substance and Sexual Activity  . Alcohol use: No  . Drug use: No  . Sexual activity: Not on file    ROS Constitutional: Denies fever, chills, weight loss/gain, headaches, insomnia,  night sweats or change in appetite. Does c/o fatigue. Eyes: Denies redness, blurred vision, diplopia, discharge, itchy or watery eyes.  ENT: Denies discharge, congestion, post nasal drip, epistaxis, sore throat, earache, hearing loss, dental pain, Tinnitus, Vertigo, Sinus pain or snoring.  Cardio: Denies chest pain, palpitations, irregular heartbeat, syncope, dyspnea, diaphoresis, orthopnea, PND, claudication or edema Respiratory: denies cough, dyspnea, DOE, pleurisy, hoarseness, laryngitis or wheezing.  Gastrointestinal: Denies dysphagia, heartburn, reflux, water brash, pain, cramps, nausea, vomiting, bloating, diarrhea, constipation, hematemesis, melena, hematochezia, jaundice or hemorrhoids Genitourinary: Denies dysuria, frequency, urgency, nocturia, hesitancy, discharge, hematuria or flank pain Musculoskeletal: Denies arthralgia, myalgia, stiffness, Jt. Swelling, pain, limp or strain/sprain. Denies Falls. Skin: Denies puritis, rash, hives, warts, acne, eczema or change in skin lesion Neuro: No weakness, tremor, incoordination, spasms, paresthesia or pain Psychiatric: Denies confusion, memory loss or sensory loss. Denies Depression. Endocrine: Denies change in weight, skin, hair change, nocturia, and paresthesia, diabetic polys, visual blurring or hyper / hypo glycemic episodes.  Heme/Lymph: No excessive bleeding, bruising or enlarged lymph nodes.  Physical Exam  BP 136/74   Pulse 88   Temp 97.9 F (36.6 C)   Resp 16   Ht 5' 7.25" (1.708 m)   Wt 175 lb 12.8 oz (79.7 kg)   BMI 27.33 kg/m   General Appearance: Well nourished and well groomed  and in no apparent distress.  Eyes: PERRLA, EOMs, conjunctiva no swelling or erythema, normal fundi and vessels. Sinuses: No frontal/maxillary tenderness ENT/Mouth: EACs patent / TMs  nl. Nares clear without erythema, swelling, mucoid exudates. Oral hygiene is good. No erythema, swelling, or exudate. Tongue normal, non-obstructing. Tonsils not swollen or erythematous. Hearing normal.  Neck: Supple, thyroid not palpable. No bruits, nodes or JVD. Respiratory: Respiratory effort normal.  BS equal and clear bilateral without rales, rhonci, wheezing or stridor. Cardio: Heart sounds are normal with regular rate and rhythm and no murmurs, rubs or gallops. Peripheral pulses are normal and equal bilaterally without edema. No aortic or femoral bruits. Chest: symmetric with normal excursions and percussion.  Abdomen: Soft, with Nl bowel sounds. Nontender, no guarding, rebound, hernias, masses, or organomegaly.  Lymphatics: Non tender without lymphadenopathy.  Musculoskeletal: Full ROM all peripheral extremities, joint stability, 5/5 strength, and normal gait. Skin: Warm and dry without rashes, lesions, cyanosis, clubbing or  ecchymosis.  Neuro: Cranial nerves intact, reflexes equal bilaterally. Normal muscle tone, no cerebellar symptoms. Sensation intact.  Pysch: Alert and oriented X 3  with normal affect, insight and judgment appropriate.   Assessment and Plan  1. Annual Preventative/Screening Exam   2. Essential hypertension  - EKG 12-Lead - [place], RETROPERITNL ABD,  LTD - Urinalysis, Routine w reflex microscopic - Microalbumin / creatinine urine ratio - CBC with Differential/Platelet - COMPLETE METABOLIC PANEL WITH GFR - Magnesium - TSH  3. Hyperlipidemia, mixed  - EKG 12-Lead - [place], RETROPERITNL ABD,  LTD - Lipid panel - TSH  4. Abnormal glucose  - EKG 12-Lead - [place], RETROPERITNL ABD,  LTD - Hemoglobin A1c  5. Vitamin D deficiency  - VITAMIN D 25 Hydroxy  6. Gastroesophageal reflux  disease  - CBC with Differential/Platelet  7. History of colon cancer  - POC Hemoccult Bld/Stl  8. Screening for colorectal cancer  - POC Hemoccult Bld/Stl (3-Cd Home Screen); Future  9. BPH with obstruction/lower urinary tract symptoms  - PSA  10. Uncomplicated asthma   11. Screening for prostate cancer  - PSA  12. Screening for ischemic heart disease  - EKG 12-Lead  13. FH: heart disease  - EKG 12-Lead - [place], RETROPERITNL ABD,  LTD  14. Aortic ectasia, thoracoabdominal (HCC)  - EKG 12-Lead - [place], RETROPERITNL ABD,  LTD  15. Screening for AAA (aortic abdominal aneurysm)  - [place], RETROPERITNL ABD,  LTD  16. Medication management  - Urinalysis, Routine w reflex microscopic - Microalbumin / creatinine urine ratio - CBC with Differential/Platelet - COMPLETE METABOLIC PANEL WITH GFR - Magnesium - Lipid panel - TSH - Hemoglobin A1c - Insulin, random - VITAMIN D 25 Hydroxyl        Patient was counseled in prudent diet, weight control to achieve/maintain BMI less than 25, BP monitoring, regular exercise and medications as discussed.  Discussed med effects and SE's. Routine screening labs and tests as requested with regular follow-up as recommended. Over 40 minutes of exam, counseling, chart review and high complex critical decision making was performed

## 2019-01-26 NOTE — Patient Instructions (Signed)

## 2019-01-27 ENCOUNTER — Other Ambulatory Visit: Payer: Self-pay

## 2019-01-27 ENCOUNTER — Ambulatory Visit (INDEPENDENT_AMBULATORY_CARE_PROVIDER_SITE_OTHER): Payer: Medicare PPO | Admitting: Internal Medicine

## 2019-01-27 VITALS — BP 136/74 | HR 88 | Temp 97.9°F | Resp 16 | Ht 67.25 in | Wt 175.8 lb

## 2019-01-27 DIAGNOSIS — E559 Vitamin D deficiency, unspecified: Secondary | ICD-10-CM | POA: Diagnosis not present

## 2019-01-27 DIAGNOSIS — Z125 Encounter for screening for malignant neoplasm of prostate: Secondary | ICD-10-CM

## 2019-01-27 DIAGNOSIS — Z0001 Encounter for general adult medical examination with abnormal findings: Secondary | ICD-10-CM

## 2019-01-27 DIAGNOSIS — Z136 Encounter for screening for cardiovascular disorders: Secondary | ICD-10-CM | POA: Diagnosis not present

## 2019-01-27 DIAGNOSIS — I10 Essential (primary) hypertension: Secondary | ICD-10-CM

## 2019-01-27 DIAGNOSIS — Z1212 Encounter for screening for malignant neoplasm of rectum: Secondary | ICD-10-CM

## 2019-01-27 DIAGNOSIS — N401 Enlarged prostate with lower urinary tract symptoms: Secondary | ICD-10-CM | POA: Diagnosis not present

## 2019-01-27 DIAGNOSIS — N138 Other obstructive and reflux uropathy: Secondary | ICD-10-CM | POA: Diagnosis not present

## 2019-01-27 DIAGNOSIS — Z85038 Personal history of other malignant neoplasm of large intestine: Secondary | ICD-10-CM

## 2019-01-27 DIAGNOSIS — Z0000 Encounter for general adult medical examination without abnormal findings: Secondary | ICD-10-CM | POA: Diagnosis not present

## 2019-01-27 DIAGNOSIS — Z79899 Other long term (current) drug therapy: Secondary | ICD-10-CM | POA: Diagnosis not present

## 2019-01-27 DIAGNOSIS — R7309 Other abnormal glucose: Secondary | ICD-10-CM | POA: Diagnosis not present

## 2019-01-27 DIAGNOSIS — E782 Mixed hyperlipidemia: Secondary | ICD-10-CM | POA: Diagnosis not present

## 2019-01-27 DIAGNOSIS — Z1211 Encounter for screening for malignant neoplasm of colon: Secondary | ICD-10-CM

## 2019-01-27 DIAGNOSIS — K219 Gastro-esophageal reflux disease without esophagitis: Secondary | ICD-10-CM

## 2019-01-27 DIAGNOSIS — Z8249 Family history of ischemic heart disease and other diseases of the circulatory system: Secondary | ICD-10-CM

## 2019-01-27 DIAGNOSIS — J45909 Unspecified asthma, uncomplicated: Secondary | ICD-10-CM

## 2019-01-27 DIAGNOSIS — R7303 Prediabetes: Secondary | ICD-10-CM

## 2019-01-27 DIAGNOSIS — I77812 Thoracoabdominal aortic ectasia: Secondary | ICD-10-CM

## 2019-01-27 MED ORDER — AZITHROMYCIN 250 MG PO TABS: ORAL_TABLET | ORAL | 1 refills | Status: DC

## 2019-01-27 MED ORDER — PREDNISONE 20 MG PO TABS: ORAL_TABLET | ORAL | 1 refills | Status: DC

## 2019-01-28 ENCOUNTER — Encounter: Payer: Self-pay | Admitting: Internal Medicine

## 2019-01-28 LAB — MICROALBUMIN / CREATININE URINE RATIO
Creatinine, Urine: 57 mg/dL (ref 20–320)
Microalb, Ur: <0.2 mg/dL

## 2019-01-28 LAB — URINALYSIS, ROUTINE W REFLEX MICROSCOPIC
Bilirubin Urine: NEGATIVE
Glucose, UA: NEGATIVE
Hgb urine dipstick: NEGATIVE
Ketones, ur: NEGATIVE
Leukocytes,Ua: NEGATIVE
Nitrite: NEGATIVE
Protein, ur: NEGATIVE
Specific Gravity, Urine: 1.012 (ref 1.001–1.03)
pH: 7.5 (ref 5.0–8.0)

## 2019-01-28 LAB — TSH: TSH: 1.18 mIU/L (ref 0.40–4.50)

## 2019-01-28 LAB — VITAMIN D 25 HYDROXY (VIT D DEFICIENCY, FRACTURES): Vit D, 25-Hydroxy: 93 ng/mL (ref 30–100)

## 2019-01-28 LAB — COMPLETE METABOLIC PANEL WITH GFR
AG RATIO: 1.4 (calc) (ref 1.0–2.5)
ALT: 36 U/L (ref 9–46)
AST: 28 U/L (ref 10–35)
Albumin: 4.2 g/dL (ref 3.6–5.1)
Alkaline phosphatase (APISO): 91 U/L (ref 35–144)
BUN: 16 mg/dL (ref 7–25)
CO2: 30 mmol/L (ref 20–32)
Calcium: 9.4 mg/dL (ref 8.6–10.3)
Chloride: 104 mmol/L (ref 98–110)
Creat: 1.12 mg/dL (ref 0.70–1.18)
GFR, EST AFRICAN AMERICAN: 73 mL/min/{1.73_m2} (ref >=60)
GFR, Est Non African American: 63 mL/min/{1.73_m2} (ref >=60)
Globulin: 3 g/dL (calc) (ref 1.9–3.7)
Glucose, Bld: 91 mg/dL (ref 65–99)
Potassium: 4.4 mmol/L (ref 3.5–5.3)
Sodium: 141 mmol/L (ref 135–146)
TOTAL PROTEIN: 7.2 g/dL (ref 6.1–8.1)
Total Bilirubin: 0.5 mg/dL (ref 0.2–1.2)

## 2019-01-28 LAB — CBC WITH DIFFERENTIAL/PLATELET
Absolute Monocytes: 568 cells/uL (ref 200–950)
BASOS PCT: 1.9 %
Basophils Absolute: 110 cells/uL (ref 0–200)
Eosinophils Absolute: 563 cells/uL — ABNORMAL HIGH (ref 15–500)
Eosinophils Relative: 9.7 %
HEMATOCRIT: 43.7 % (ref 38.5–50.0)
Hemoglobin: 14.3 g/dL (ref 13.2–17.1)
LYMPHS ABS: 1543 {cells}/uL (ref 850–3900)
MCH: 29.4 pg (ref 27.0–33.0)
MCHC: 32.7 g/dL (ref 32.0–36.0)
MCV: 89.9 fL (ref 80.0–100.0)
MPV: 10.5 fL (ref 7.5–12.5)
Monocytes Relative: 9.8 %
Neutro Abs: 3016 cells/uL (ref 1500–7800)
Neutrophils Relative %: 52 %
Platelets: 219 10*3/uL (ref 140–400)
RBC: 4.86 10*6/uL (ref 4.20–5.80)
RDW: 13.1 % (ref 11.0–15.0)
Total Lymphocyte: 26.6 %
WBC: 5.8 10*3/uL (ref 3.8–10.8)

## 2019-01-28 LAB — MAGNESIUM: Magnesium: 2.1 mg/dL (ref 1.5–2.5)

## 2019-01-28 LAB — INSULIN, RANDOM: Insulin: 7.5 u[IU]/mL

## 2019-01-28 LAB — HEMOGLOBIN A1C
Hgb A1c MFr Bld: 6.3 % of total Hgb — ABNORMAL HIGH (ref <5.7)
Mean Plasma Glucose: 134 (calc)
eAG (mmol/L): 7.4 (calc)

## 2019-01-28 LAB — LIPID PANEL
Cholesterol: 141 mg/dL (ref <200)
HDL: 61 mg/dL (ref >=40)
LDL Cholesterol (Calc): 65 mg/dL (calc)
Non-HDL Cholesterol (Calc): 80 mg/dL (calc) (ref <130)
Total CHOL/HDL Ratio: 2.3 (calc) (ref <5.0)
Triglycerides: 74 mg/dL (ref <150)

## 2019-01-28 LAB — PSA: PSA: 0.4 ng/mL (ref <=4.0)

## 2019-03-19 ENCOUNTER — Other Ambulatory Visit: Payer: Self-pay | Admitting: Internal Medicine

## 2019-04-25 ENCOUNTER — Ambulatory Visit: Payer: Self-pay | Admitting: Adult Health

## 2019-05-02 NOTE — Progress Notes (Signed)
MEDICARE ANNUAL WELLNESS VISIT AND FOLLOW UP Assessment:    Encounter for Medicare annual wellness exam  Essential hypertension - continue medications, DASH diet, exercise and monitor at home. Call if greater than 130/80.   Mixed hyperlipidemia continue crestor, discontinue zetia and reevaluate in 3 months -continue medications, check lipids, decrease fatty foods, increase activity.   Prediabetes Discussed general issues about diabetes pathophysiology and management., Educational material distributed., Suggested low cholesterol diet., Encouraged aerobic exercise., Discussed foot care., Reminded to get yearly retinal exam. Check A1C at CPE; monitor CMP  Vitamin D deficiency At goal at recent check; continue to recommend supplementation for goal of 60-100 Defer vitamin D level  Medication management  Uncomplicated asthma, unspecified asthma severity, unspecified whether persistent Controlled on inhalers/singulaire, avoid triggers   Gastroesophageal reflux disease, esophagitis presence not specified Continue PPI/H2 blocker, diet discussed  Benign prostatic hyperplasia with nocturia Continue to monitor PSA at CPE  History of colon cancer UTD on colonoscopy  Overweight Long discussion about weight loss, diet, and exercise Recommended diet heavy in fruits and veggies and low in animal meats, cheeses, and dairy products, appropriate calorie intake Discussed appropriate weight for height  Follow up at next visit  Osteoarthritis of bilateral knees Followed by Dr. Sharol Given getting injections    Future Appointments  Date Time Provider [place]  08/03/2019  2:30 PM Unk Pinto, MD [facility] None  02/09/2020  9:00 AM Unk Pinto, MD [facility] None      Plan:   During the course of the visit the patient was educated and counseled about appropriate screening and preventive services including:    Pneumococcal vaccine   Influenza vaccine  Td  vaccine  Screening electrocardiogram  Colorectal cancer screening  Diabetes screening  Glaucoma screening   Subjective:  Jon [name] is a 78 y.o. male who presents for Medicare Annual Wellness Visit and 3 month follow up for HTN, hyperlipidemia, prediabetes, and vitamin D Def.   He has a history of colon cancer in 2004, up to date on colonoscopy.  Asthma well controlled on inhalers and singulair   He is followed by Dr. Sharol Given for R knee pain and getting knee injections which are helpful.   BMI is Body mass index is 28.11 kg/m., he has been working on diet and exercise, walking 2 miles 4 days a week and body weight exercises.  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  He denies chest pain, shortness of breath, dizziness.  He is on cholesterol medication (crestor 40 mg and zetia 10 mg daily) and denies myalgias. His cholesterol is at goal. The cholesterol last visit was:   Lab Results  Component Value Date   CHOL 141 01/27/2019   HDL 61 01/27/2019   LDLCALC 65 01/27/2019   TRIG 74 01/27/2019   CHOLHDL 2.3 01/27/2019   He has been working on diet and exercise for prediabetes, and denies paresthesia of the feet, polydipsia and polyuria. Last A1C in the office was:  Lab Results  Component Value Date   HGBA1C 6.3 (H) 01/27/2019   Patient is on Vitamin D supplement and at goal:   Lab  Results  Component Value Date   VD25OH 93 01/27/2019      Medication Review: Current Outpatient Medications on File Prior to Visit  Medication Sig Dispense Refill  . ADVAIR DISKUS 250-50 MCG/DOSE AEPB USE 1 INHALATION TWICE A DAY 180 each 4  . cetirizine (ZYRTEC) 10 MG tablet TAKE 1 TABLET  DAILY FOR ALLERGIES 90 tablet 4  . ezetimibe (ZETIA) 10 MG tablet Take 1 tablet Daily for Cholesterol 90 tablet 3  . Ipratropium-Albuterol (COMBIVENT RESPIMAT) 20-100 MCG/ACT AERS respimat Inhale 1 puff into the lungs every 4 (four) hours as needed for wheezing. 4 g 4  . losartan (COZAAR) 50 MG tablet TAKE 1 TABLET DAILY FOR BLOOD PRESSURE 90 tablet 3  . montelukast (SINGULAIR) 10 MG tablet TAKE 1 TABLET DAILY FOR ALLERGY AND ASTHMA 90 tablet 4  . rosuvastatin (CRESTOR) 40 MG tablet Take 1/2 to 1 tablet daily or as directed for Cholesterol 90 tablet 3  . verapamil (CALAN-SR) 240 MG CR tablet Take 1 tablet (240 mg total) by mouth daily. with food 90 tablet 1  . Vitamin D, Ergocalciferol, (DRISDOL) 50000 units CAPS capsule TAKE 1 CAPSULE DAILY FOR SEVERE VITAMIN D DEFICIENCY 90 capsule 2   No current facility-administered medications on file prior to visit.     Current Problems (verified) Patient Active Problem List   Diagnosis Date Noted  . Overweight (BMI 25.0-29.9) 04/22/2018  . Osteoarthritis of knees, bilateral 06/24/2017  . Medication management 02/16/2014  . Hyperlipidemia   . Hypertension   . Vitamin D deficiency   . Prediabetes   . Asthma   . GERD (gastroesophageal reflux disease)   . History of colon cancer   . BPH (benign prostatic hyperplasia)     Screening Tests Immunization History  Administered Date(s) Administered  . DT 07/10/2009  . Influenza Split 07/28/2013  . Influenza, High Dose Seasonal PF 09/18/2014, 07/26/2015, 08/18/2016, 07/13/2017, 07/28/2018  . Pneumococcal Conjugate-13 02/07/2016  . Pneumococcal Polysaccharide-23 02/22/2007   Preventative care: CT pelvis/AB 2007 Last colonoscopy: 2017, Dr. Earlean Shawl, repeat colonoscopy 5 years, due 2022 CXR 2009  Prior vaccinations: TD or Tdap: 2010, due 07/2019 Influenza: 2019 Pneumococcal: 2008 Prevnar 13 2017  Shingles/Zostavax: declines  Names of Other Physician/Practitioners you currently use: 1.  [facility] here for primary care 2. [employer], dentist, next appointment in 2019, has upcoming appointment in July 3.  Dr. Bing Plume, vision, 2019, has upcoming appointment   Patient Care Team: Unk Pinto, MD as PCP - General (Internal Medicine) Calvert Cantor, MD as Consulting Physician (Ophthalmology) Richmond Campbell, MD as Consulting Physician (Gastroenterology) Ladell Pier, MD as Consulting Physician (Oncology)  History reviewed: allergies, current medications, past family history, past medical history, past social history, past surgical history and problem list  Allergies Allergies  Allergen Reactions  . Mavik [Trandolapril]     Cough  . Hctz [Hydrochlorothiazide] Rash    Photosensitivity    SURGICAL HISTORY He  has a past surgical history that includes Colon surgery (2004). FAMILY HISTORY His family history includes Autoimmune disease in his brother; Cancer in his father and sister; Heart disease in his mother. SOCIAL HISTORY He  reports that he has never smoked. He has never used smokeless tobacco. He reports that he does not drink alcohol or use drugs.   MEDICARE WELLNESS OBJECTIVES: Physical activity:   Cardiac risk factors:   Depression/mood screen:   Depression screen [facility] 2/9 01/28/2019  Decreased Interest 0  Down, Depressed, Hopeless 0  PHQ - 2 Score  0    ADLs:  In your present state of health, do you have any difficulty performing the following activities: 01/28/2019 07/31/2018  Hearing? N N  Vision? N N  Difficulty concentrating or making decisions? N N  Walking or climbing stairs? N N  Dressing or bathing? N N  Doing errands, shopping? N N  Some recent data might be hidden     Cognitive Testing  Alert? Yes  Normal Appearance?Yes  Oriented to person? Yes  Place? Yes   Time? Yes  Recall of three objects?  2/3  Can perform simple calculations? Yes  Displays appropriate judgment?Yes  Can read the  correct time from a watch face?Yes  EOL planning:     Objective:   Blood pressure 122/74, pulse 80, temperature (!) 97 F (36.1 C), resp. rate 16, height 5' 7.25" (1.708 m), weight 180 lb 12.8 oz (82 kg). Body mass index is 28.11 kg/m.  General appearance: alert, no distress, WD/WN, male HEENT: normocephalic, sclerae anicteric, TMs pearly, nares patent, no discharge or erythema, pharynx normal Oral cavity: MMM, no lesions Neck: supple, no lymphadenopathy, no thyromegaly, no masses Heart: RRR, normal S1, S2, no murmurs Lungs: CTA bilaterally, no wheezes, rhonchi, or rales Abdomen: +bs, soft, non tender, non distended, no masses, no hepatomegaly, no splenomegaly Musculoskeletal: nontender, no swelling, no obvious deformity. He has brace to R knee.  Extremities: no edema, no cyanosis, no clubbing Pulses: 2+ symmetric, upper and lower extremities, normal cap refill Neurological: alert, oriented x 3, CN2-12 intact, strength normal upper extremities and lower extremities, sensation normal throughout, DTRs 2+ throughout, no cerebellar signs, gait normal Psychiatric: normal affect, behavior normal, pleasant   Medicare Attestation I have personally reviewed: The patient's medical and social history Their use of alcohol, tobacco or illicit drugs Their current medications and supplements The patient's functional ability including ADLs,fall risks, home safety risks, cognitive, and hearing and visual impairment Diet and physical activities Evidence for depression or mood disorders  The patient's weight, height, BMI, and visual acuity have been recorded in the chart.  I have made referrals, counseling, and provided education to the patient based on review of the above and I have provided the patient with a written personalized care plan for preventive services.     Izora Ribas, NP   05/03/2019

## 2019-05-03 ENCOUNTER — Encounter: Payer: Self-pay | Admitting: Adult Health

## 2019-05-03 ENCOUNTER — Ambulatory Visit (INDEPENDENT_AMBULATORY_CARE_PROVIDER_SITE_OTHER): Payer: Medicare PPO | Admitting: Adult Health

## 2019-05-03 ENCOUNTER — Other Ambulatory Visit: Payer: Self-pay

## 2019-05-03 VITALS — BP 122/74 | HR 80 | Temp 97.0°F | Resp 16 | Ht 67.25 in | Wt 180.8 lb

## 2019-05-03 DIAGNOSIS — Z0000 Encounter for general adult medical examination without abnormal findings: Secondary | ICD-10-CM

## 2019-05-03 DIAGNOSIS — N401 Enlarged prostate with lower urinary tract symptoms: Secondary | ICD-10-CM | POA: Diagnosis not present

## 2019-05-03 DIAGNOSIS — R6889 Other general symptoms and signs: Secondary | ICD-10-CM | POA: Diagnosis not present

## 2019-05-03 DIAGNOSIS — Z0001 Encounter for general adult medical examination with abnormal findings: Secondary | ICD-10-CM

## 2019-05-03 DIAGNOSIS — E782 Mixed hyperlipidemia: Secondary | ICD-10-CM | POA: Diagnosis not present

## 2019-05-03 DIAGNOSIS — I10 Essential (primary) hypertension: Secondary | ICD-10-CM

## 2019-05-03 DIAGNOSIS — M170 Bilateral primary osteoarthritis of knee: Secondary | ICD-10-CM

## 2019-05-03 DIAGNOSIS — E559 Vitamin D deficiency, unspecified: Secondary | ICD-10-CM | POA: Diagnosis not present

## 2019-05-03 DIAGNOSIS — Z79899 Other long term (current) drug therapy: Secondary | ICD-10-CM

## 2019-05-03 DIAGNOSIS — J4530 Mild persistent asthma, uncomplicated: Secondary | ICD-10-CM | POA: Diagnosis not present

## 2019-05-03 DIAGNOSIS — Z6827 Body mass index (BMI) 27.0-27.9, adult: Secondary | ICD-10-CM

## 2019-05-03 DIAGNOSIS — R7303 Prediabetes: Secondary | ICD-10-CM

## 2019-05-03 DIAGNOSIS — E663 Overweight: Secondary | ICD-10-CM | POA: Diagnosis not present

## 2019-05-03 DIAGNOSIS — R351 Nocturia: Secondary | ICD-10-CM

## 2019-05-03 DIAGNOSIS — K219 Gastro-esophageal reflux disease without esophagitis: Secondary | ICD-10-CM

## 2019-05-03 DIAGNOSIS — Z85038 Personal history of other malignant neoplasm of large intestine: Secondary | ICD-10-CM

## 2019-05-03 NOTE — Patient Instructions (Addendum)
[name] , Thank you for taking time to come for your Medicare Wellness Visit. I appreciate your ongoing commitment to your health goals. Please review the following plan we discussed and let me know if I can assist you in the future.   These are the goals we discussed: Goals    . DIET - INCREASE WATER INTAKE     5-6 bottles daily     . HEMOGLOBIN A1C < 5.7       This is a list of the screening recommended for you and due dates:  Health Maintenance  Topic Date Due  . Tetanus Vaccine  08/03/2019*  . Flu Shot  06/04/2019  . Pneumonia vaccines  Completed  *Topic was postponed. The date shown is not the original due date.    Please bring copies of advanced directives and living (only medically relevant portions).    STOP taking zetia - continue rosuvastatin for cholesterol   Let [facility] know that you are due for tet[facility] booster    Preventing Type 2 Diabetes Mellitus Type 2 diabetes (type 2 diabetes mellitus) is a long-term (chronic) disease that affects blood sugar (glucose) levels. Normally, a hormone called insulin allows glucose to enter cells in the body. The cells use glucose for energy. In type 2 diabetes, one or both of these problems may be present:  The body does not make enough insulin.  The body does not respond properly to insulin that it makes (insulin resistance). Insulin resistance or lack of insulin causes excess glucose to build up in the blood instead of going into cells. As a result, high blood glucose (hyperglycemia) develops, which can cause many complications. Being overweight or obese and having an inactive (sedentary) lifestyle can increase your risk for diabetes. Type 2 diabetes can be delayed or prevented by making certain nutrition and lifestyle changes. What nutrition changes can be made?   Eat healthy meals and snacks regularly. Keep a healthy snack with you for when you get hungry between meals, such as fruit or a handful of nuts.  Eat lean meats  and proteins that are low in saturated fats, such as chicken, fish, egg whites, and beans. Avoid processed meats.  Eat plenty of fruits and vegetables and plenty of grains that have not been processed (whole grains). It is recommended that you eat: ? 1?2 cups of fruit every day. ? 2?3 cups of vegetables every day. ? 6?8 oz of whole grains every day, such as oats, whole wheat, bulgur, brown rice, quinoa, and millet.  Eat low-fat dairy products, such as milk, yogurt, and cheese.  Eat foods that contain healthy fats, such as nuts, avocado, olive oil, and canola oil.  Drink water throughout the day. Avoid drinks that contain added sugar, such as soda or sweet tea.  Follow instructions from your health care provider about specific eating or drinking restrictions.  Control how much food you eat at a time (portion size). ? Check food labels to find out the serving sizes of foods. ? Use a kitchen scale to weigh amounts of foods.  Saute or steam food instead of frying it. Cook with water or broth instead of oils or butter.  Limit your intake of: ? Salt (sodium). Have no more than 1 tsp (2,400 mg) of sodium a day. If you have heart disease or high blood pressure, have less than ? tsp (1,500 mg) of sodium a day. ? Saturated fat. This is fat that is solid at room temperature, such as butter  or fat on meat. What lifestyle changes can be made? Activity   Do moderate-intensity physical activity for at least 30 minutes on at least 5 days of the week, or as much as told by your health care provider.  Ask your health care provider what activities are safe for you. A mix of physical activities may be best, such as walking, swimming, cycling, and strength training.  Try to add physical activity into your day. For example: ? Park in spots that are farther away than usual, so that you walk more. For example, park in a far corner of the parking lot when you go to the office or the grocery  store. ? Take a walk during your lunch break. ? Use stairs instead of elevators or escalators. Weight Loss  Lose weight as directed. Your health care provider can determine how much weight loss is best for you and can help you lose weight safely.  If you are overweight or obese, you may be instructed to lose at least 5?7 % of your body weight. Alcohol and Tobacco   Limit alcohol intake to no more than 1 drink a day for nonpregnant women and 2 drinks a day for men. One drink equals 12 oz of beer, 5 oz of wine, or 1 oz of hard liquor.  Do not use any tobacco products, such as cigarettes, chewing tobacco, and e-cigarettes. If you need help quitting, ask your health care provider. Work With [place] Provider  Have your blood glucose tested regularly, as told by your health care provider.  Discuss your risk factors and how you can reduce your risk for diabetes.  Get screening tests as told by your health care provider. You may have screening tests regularly, especially if you have certain risk factors for type 2 diabetes.  Make an appointment with a diet and nutrition specialist (registered dietitian). A registered dietitian can help you make a healthy eating plan and can help you understand portion sizes and food labels. Why are these changes important?  It is possible to prevent or delay type 2 diabetes and related health problems by making lifestyle and nutrition changes.  It can be difficult to recognize signs of type 2 diabetes. The best way to avoid possible damage to your body is to take actions to prevent the disease before you develop symptoms. What can happen if changes are not made?  Your blood glucose levels may keep increasing. Having high blood glucose for a long time is dangerous. Too much glucose in your blood can damage your blood vessels, heart, kidneys, nerves, and eyes.  You may develop prediabetes or type 2 diabetes. Type 2 diabetes can lead to many chronic  health problems and complications, such as: ? Heart disease. ? Stroke. ? Blindness. ? Kidney disease. ? Depression. ? Poor circulation in the feet and legs, which could lead to surgical removal (amputation) in severe cases. Where to find support  Ask your health care provider to recommend a registered dietitian, diabetes educator, or weight loss program.  Look for local or online weight loss groups.  Join a gym, fitness club, or outdoor activity group, such as a walking club. Where to find more information To learn more about diabetes and diabetes prevention, visit:  American Diabetes Association (ADA): www.diabetes.[employer] of Diabetes and Digestive and Kidney Diseases: FindSpin.nl To learn more about healthy eating, visit:  The U.S. Department of Agriculture Scientist, research (physical sciences)), Choose My Plate: http://wiley-williams.com/  Office of Disease Prevention and Health  Promotion (ODPHP), Dietary Guidelines: SurferLive.at Summary  You can reduce your risk for type 2 diabetes by increasing your physical activity, eating healthy foods, and losing weight as directed.  Talk with your health care provider about your risk for type 2 diabetes. Ask about any blood tests or screening tests that you need to have. This information is not intended to replace advice given to you by your health care provider. Make sure you discuss any questions you have with your health care provider. Document Released: 02/11/2016 Document Revised: 02/11/2019 Document Reviewed: 12/11/2015 Elsevier Patient Education  2020 [employer].

## 2019-05-04 LAB — CBC WITH DIFFERENTIAL/PLATELET
Absolute Monocytes: 737 cells/uL (ref 200–950)
Basophils Absolute: 88 cells/uL (ref 0–200)
Basophils Relative: 1.4 %
Eosinophils Absolute: 435 cells/uL (ref 15–500)
Eosinophils Relative: 6.9 %
HCT: 41.5 % (ref 38.5–50.0)
Hemoglobin: 13.6 g/dL (ref 13.2–17.1)
Lymphs Abs: 1751 cells/uL (ref 850–3900)
MCH: 29.5 pg (ref 27.0–33.0)
MCHC: 32.8 g/dL (ref 32.0–36.0)
MCV: 90 fL (ref 80.0–100.0)
MPV: 10.8 fL (ref 7.5–12.5)
Monocytes Relative: 11.7 %
Neutro Abs: 3289 cells/uL (ref 1500–7800)
Neutrophils Relative %: 52.2 %
Platelets: 211 10*3/uL (ref 140–400)
RBC: 4.61 10*6/uL (ref 4.20–5.80)
RDW: 13.4 % (ref 11.0–15.0)
Total Lymphocyte: 27.8 %
WBC: 6.3 10*3/uL (ref 3.8–10.8)

## 2019-05-04 LAB — LIPID PANEL
Cholesterol: 126 mg/dL (ref <200)
HDL: 58 mg/dL (ref >=40)
LDL Cholesterol (Calc): 52 mg/dL (calc)
Non-HDL Cholesterol (Calc): 68 mg/dL (calc) (ref <130)
Total CHOL/HDL Ratio: 2.2 (calc) (ref <5.0)
Triglycerides: 82 mg/dL (ref <150)

## 2019-05-04 LAB — COMPLETE METABOLIC PANEL WITH GFR
AG Ratio: 1.5 (calc) (ref 1.0–2.5)
ALT: 21 U/L (ref 9–46)
AST: 22 U/L (ref 10–35)
Albumin: 4.3 g/dL (ref 3.6–5.1)
Alkaline phosphatase (APISO): 94 U/L (ref 35–144)
BUN/Creatinine Ratio: 17 (calc) (ref 6–22)
BUN: 20 mg/dL (ref 7–25)
CO2: 29 mmol/L (ref 20–32)
Calcium: 9.7 mg/dL (ref 8.6–10.3)
Chloride: 105 mmol/L (ref 98–110)
Creat: 1.2 mg/dL — ABNORMAL HIGH (ref 0.70–1.18)
GFR, Est African American: 67 mL/min/{1.73_m2} (ref >=60)
GFR, Est Non African American: 58 mL/min/{1.73_m2} — ABNORMAL LOW (ref >=60)
Globulin: 2.8 g/dL (calc) (ref 1.9–3.7)
Glucose, Bld: 85 mg/dL (ref 65–99)
Potassium: 4.2 mmol/L (ref 3.5–5.3)
Sodium: 142 mmol/L (ref 135–146)
Total Bilirubin: 0.4 mg/dL (ref 0.2–1.2)
Total Protein: 7.1 g/dL (ref 6.1–8.1)

## 2019-05-04 LAB — MAGNESIUM: Magnesium: 2.3 mg/dL (ref 1.5–2.5)

## 2019-05-04 LAB — TSH: TSH: 1.63 mIU/L (ref 0.40–4.50)

## 2019-05-25 ENCOUNTER — Ambulatory Visit (INDEPENDENT_AMBULATORY_CARE_PROVIDER_SITE_OTHER): Payer: Medicare PPO | Admitting: Physician Assistant

## 2019-05-25 ENCOUNTER — Encounter: Payer: Self-pay | Admitting: Physician Assistant

## 2019-05-25 ENCOUNTER — Telehealth: Payer: Self-pay | Admitting: Radiology

## 2019-05-25 ENCOUNTER — Other Ambulatory Visit: Payer: Self-pay

## 2019-05-25 VITALS — Ht 68.0 in | Wt 175.0 lb

## 2019-05-25 DIAGNOSIS — M1711 Unilateral primary osteoarthritis, right knee: Secondary | ICD-10-CM

## 2019-05-25 MED ORDER — LIDOCAINE HCL 1 % IJ SOLN
3.0000 mL | INTRAMUSCULAR | Status: AC | PRN
  Administered 2019-05-25: 3 mL

## 2019-05-25 MED ORDER — METHYLPREDNISOLONE ACETATE 40 MG/ML IJ SUSP
40.0000 mg | INTRAMUSCULAR | Status: AC | PRN
  Administered 2019-05-25: 40 mg via INTRA_ARTICULAR

## 2019-05-25 NOTE — Telephone Encounter (Signed)
Please submit for authorization for gel injection for Jon Stabile, PA-C. Thanks.

## 2019-05-25 NOTE — Progress Notes (Signed)
   Procedure Note  Patient: Jon [name]             Date of Birth: 13-Nov-1940           MRN: [id number]             Visit Date: 05/25/2019  HPI: Jon [name] returns today requesting a cortisone injection in his right knee.  He said no new injury.  He states that he is having some quad atrophy due to the fact that he just cannot get out and exercise as the pools are close.  He is wondering what kind of exercise that he can do to be friendly to his knee.  Right knee: Full attention flexion no instability valgus varus stressing.  Crepitus patella region with passive range of motion of the right knee.  No abnormal warmth erythema or effusion  Procedures: Visit Diagnoses:  1. Unilateral primary osteoarthritis, right knee     Large Joint Inj: R knee on 05/25/2019 1:13 PM Indications: pain Details: 22 G 1.5 in needle, anterolateral approach  Arthrogram: No  Medications: 3 mL lidocaine 1 %; 40 mg methylPREDNISolone acetate 40 MG/ML Outcome: tolerated well, no immediate complications Procedure, treatment alternatives, risks and benefits explained, specific risks discussed. Consent was given by the patient. Immediately prior to procedure a time out was called to verify the correct patient, procedure, equipment, support staff and site/side marked as required. Patient was prepped and draped in the usual sterile fashion.    Plan: We will send him to physical therapy for quad strengthening due to his quad atrophy.  Having follow-up with [place] once supplemental injections available.  Questions were encouraged and answered at length today.  He tolerated cortisone injection well today.

## 2019-05-25 NOTE — Addendum Note (Signed)
Addended by: Meyer Cory on: 05/25/2019 02:14 PM   Modules accepted: Orders

## 2019-05-30 ENCOUNTER — Other Ambulatory Visit: Payer: Self-pay

## 2019-05-30 MED ORDER — VERAPAMIL HCL ER 240 MG PO TBCR: 240.0000 mg | EXTENDED_RELEASE_TABLET | Freq: Every day | ORAL | 1 refills | Status: DC

## 2019-05-30 NOTE — Telephone Encounter (Signed)
Noted  

## 2019-06-02 ENCOUNTER — Other Ambulatory Visit: Payer: Self-pay | Admitting: Internal Medicine

## 2019-06-06 ENCOUNTER — Telehealth: Payer: Self-pay

## 2019-06-06 NOTE — Telephone Encounter (Signed)
Called and left a VM for patient to CB to schedule an appointment for gel injection with Dr. Ninfa Linden or Artis Delay.  Approved for Monovisc, right knee. [employer] ([employer]) will cover 100% of patient's responsibility from the primary. Co-pay of $50.00 No PA required per [employer]. Reference# [id number]

## 2019-06-08 ENCOUNTER — Other Ambulatory Visit: Payer: Self-pay | Admitting: Adult Health

## 2019-06-08 DIAGNOSIS — E782 Mixed hyperlipidemia: Secondary | ICD-10-CM

## 2019-06-13 ENCOUNTER — Encounter: Payer: Self-pay | Admitting: Orthopaedic Surgery

## 2019-06-13 ENCOUNTER — Other Ambulatory Visit: Payer: Self-pay | Admitting: *Deleted

## 2019-06-13 ENCOUNTER — Ambulatory Visit (INDEPENDENT_AMBULATORY_CARE_PROVIDER_SITE_OTHER): Payer: Medicare PPO | Admitting: Orthopaedic Surgery

## 2019-06-13 DIAGNOSIS — G8929 Other chronic pain: Secondary | ICD-10-CM

## 2019-06-13 DIAGNOSIS — M1711 Unilateral primary osteoarthritis, right knee: Secondary | ICD-10-CM | POA: Diagnosis not present

## 2019-06-13 DIAGNOSIS — J4530 Mild persistent asthma, uncomplicated: Secondary | ICD-10-CM

## 2019-06-13 DIAGNOSIS — M25561 Pain in right knee: Secondary | ICD-10-CM

## 2019-06-13 MED ORDER — HYALURONAN 88 MG/4ML IX SOSY
88.0000 mg | PREFILLED_SYRINGE | INTRA_ARTICULAR | Status: AC | PRN
  Administered 2019-06-13: 88 mg via INTRA_ARTICULAR

## 2019-06-13 MED ORDER — FLUTICASONE-SALMETEROL 250-50 MCG/DOSE IN AEPB: INHALATION_SPRAY | RESPIRATORY_TRACT | 4 refills | Status: DC

## 2019-06-13 MED ORDER — COMBIVENT RESPIMAT 20-100 MCG/ACT IN AERS: 1.0000 | INHALATION_SPRAY | RESPIRATORY_TRACT | 4 refills | Status: DC | PRN

## 2019-06-13 NOTE — Progress Notes (Signed)
   Procedure Note  Patient: Jon [name]             Date of Birth: Nov 15, 1940           MRN: [id number]             Visit Date: 06/13/2019  Procedures: Visit Diagnoses:  1. Unilateral primary osteoarthritis, right knee   2. Chronic pain of right knee     Large Joint Inj: R knee on 06/13/2019 9:45 AM Indications: diagnostic evaluation and pain Details: 22 G 1.5 in needle, superolateral approach  Arthrogram: No  Medications: 88 mg Hyaluronan 88 MG/4ML Outcome: tolerated well, no immediate complications Procedure, treatment alternatives, risks and benefits explained, specific risks discussed. Consent was given by the patient. Immediately prior to procedure a time out was called to verify the correct patient, procedure, equipment, support staff and site/side marked as required. Patient was prepped and draped in the usual sterile fashion.    The patient is here today for scheduled hyaluronic acid injection into his right knee with Monovisc.  This is to treat the pain from known osteoarthritis.  He last had this injection 2 years ago and this helped significantly.  This helped more than steroid injections.  He still works on activity modification and avoids running.  He is 78 years old.  His biggest problems are going up and down stairs and up and down hills.  On examination of his right knee there is no effusion.  He has mild varus malalignment with good range of motion.  I did place Monovisc in the right knee without difficulty.  All question concerns were answered and addressed.  Follow-up will be as needed.

## 2019-08-03 ENCOUNTER — Encounter: Payer: Self-pay | Admitting: Internal Medicine

## 2019-08-03 ENCOUNTER — Ambulatory Visit (INDEPENDENT_AMBULATORY_CARE_PROVIDER_SITE_OTHER): Payer: Medicare PPO | Admitting: Internal Medicine

## 2019-08-03 ENCOUNTER — Other Ambulatory Visit: Payer: Self-pay

## 2019-08-03 ENCOUNTER — Other Ambulatory Visit: Payer: Self-pay | Admitting: *Deleted

## 2019-08-03 VITALS — BP 132/76 | HR 76 | Temp 97.2°F | Resp 16 | Ht 68.0 in | Wt 178.2 lb

## 2019-08-03 DIAGNOSIS — Z23 Encounter for immunization: Secondary | ICD-10-CM

## 2019-08-03 DIAGNOSIS — E782 Mixed hyperlipidemia: Secondary | ICD-10-CM

## 2019-08-03 DIAGNOSIS — R7303 Prediabetes: Secondary | ICD-10-CM

## 2019-08-03 DIAGNOSIS — E559 Vitamin D deficiency, unspecified: Secondary | ICD-10-CM

## 2019-08-03 DIAGNOSIS — J4530 Mild persistent asthma, uncomplicated: Secondary | ICD-10-CM | POA: Diagnosis not present

## 2019-08-03 DIAGNOSIS — R7309 Other abnormal glucose: Secondary | ICD-10-CM | POA: Diagnosis not present

## 2019-08-03 DIAGNOSIS — I10 Essential (primary) hypertension: Secondary | ICD-10-CM

## 2019-08-03 DIAGNOSIS — Z79899 Other long term (current) drug therapy: Secondary | ICD-10-CM

## 2019-08-03 MED ORDER — VITAMIN D (ERGOCALCIFEROL) 1.25 MG (50000 UNIT) PO CAPS: ORAL_CAPSULE | ORAL | 2 refills | Status: DC

## 2019-08-03 NOTE — Progress Notes (Signed)
History of Present Illness:      This very nice 78 y.o. MWM presents for 6 month follow up with HTN, HLD, Pre-Diabetes and Vitamin D Deficiency. Patient also has hx/o seasonal allergic seasonal.      Patient is treated for HTN (2004) & BP has been controlled at home. Today's BP is at goal - 132/76. Patient has had no complaints of any cardiac type chest pain, palpitations, dyspnea / orthopnea / PND, dizziness, claudication, or dependent edema.      Hyperlipidemia is controlled with diet & meds. Patient denies myalgias or other med SE's. Last Lipids were at goal:  Lab Results  Component Value Date   CHOL 126 05/03/2019   HDL 58 05/03/2019   LDLCALC 52 05/03/2019   TRIG 82 05/03/2019   CHOLHDL 2.2 05/03/2019       Also, the patient has history of PreDiabetes (A1c 6.3% / 2013)  and has had no symptoms of reactive hypoglycemia, diabetic polys, paresthesias or visual blurring.  Last A1c was not at goal:  Lab Results  Component Value Date   HGBA1C 6.3 (H) 01/27/2019       Further, the patient also has history of Vitamin D Deficiency ("24" / 2008) and supplements vitamin D without any suspected side-effects. Last vitamin D was at goal:  Lab Results  Component Value Date   VD25OH 93 01/27/2019    Current Outpatient Medications on File Prior to Visit  Medication Sig  . cetirizine (ZYRTEC) 10 MG tablet TAKE 1 TABLET DAILY FOR ALLERGIES  . Fluticasone-Salmeterol (ADVAIR DISKUS) 250-50 MCG/DOSE AEPB USE 1 INHALATION TWICE A DAY  . Ipratropium-Albuterol (COMBIVENT RESPIMAT) 20-100 MCG/ACT AERS respimat Inhale 1 puff into the lungs every 4 (four) hours as needed for wheezing.  Marland Kitchen losartan (COZAAR) 50 MG tablet TAKE 1 TABLET DAILY FOR BLOOD PRESSURE  . montelukast (SINGULAIR) 10 MG tablet TAKE 1 TABLET DAILY FOR ALLERGY AND ASTHMA  . rosuvastatin (CRESTOR) 40 MG tablet Take 1 tablet Daily for Cholesterol  . verapamil (CALAN-SR) 240 MG CR tablet Take 1 tablet (240 mg total) by mouth  daily. with food   No current facility-administered medications on file prior to visit.    Allergies  Allergen Reactions  . Mavik [Trandolapril]     Cough  . Hctz [Hydrochlorothiazide] Rash    Photosensitivity   PMHx:   Past Medical History:  Diagnosis Date  . Allergy   . Asthma   . BPH (benign prostatic hyperplasia)   . GERD (gastroesophageal reflux disease)   . History of colon cancer   . Hyperlipidemia   . Hypertension   . Prediabetes   . Vitamin D deficiency    Immunization History  Administered Date(s) Administered  . DT 07/10/2009  . Influenza Split 07/28/2013  . Influenza, High Dose Seasonal PF 09/18/2014, 07/26/2015, 08/18/2016, 07/13/2017, 07/28/2018, 08/03/2019  . Pneumococcal Conjugate-13 02/07/2016  . Pneumococcal Polysaccharide-23 02/22/2007   Past Surgical History:  Procedure Laterality Date  . COLON SURGERY  2004   Dr Rosana Hoes   FHx:    Reviewed / unchanged  SHx:    Reviewed / unchanged   Systems Review:  Constitutional: Denies fever, chills, wt changes, headaches, insomnia, fatigue, night sweats, change in appetite. Eyes: Denies redness, blurred vision, diplopia, discharge, itchy, watery eyes.  ENT: Denies discharge, congestion, post nasal drip, epistaxis, sore throat, earache, hearing loss, dental pain, tinnitus, vertigo, sinus pain, snoring.  CV: Denies chest pain, palpitations, irregular heartbeat, syncope, dyspnea, diaphoresis, orthopnea, PND,  claudication or edema. Respiratory: denies cough, dyspnea, DOE, pleurisy, hoarseness, laryngitis, wheezing.  Gastrointestinal: Denies dysphagia, odynophagia, heartburn, reflux, water brash, abdominal pain or cramps, nausea, vomiting, bloating, diarrhea, constipation, hematemesis, melena, hematochezia  or hemorrhoids. Genitourinary: Denies dysuria, frequency, urgency, nocturia, hesitancy, discharge, hematuria or flank pain. Musculoskeletal: Denies arthralgias, myalgias, stiffness, jt. swelling, pain, limping or  strain/sprain.  Skin: Denies pruritus, rash, hives, warts, acne, eczema or change in skin lesion(s). Neuro: No weakness, tremor, incoordination, spasms, paresthesia or pain. Psychiatric: Denies confusion, memory loss or sensory loss. Endo: Denies change in weight, skin or hair change.  Heme/Lymph: No excessive bleeding, bruising or enlarged lymph nodes.  Physical Exam  BP 132/76   Pulse 76   Temp (!) 97.2 F (36.2 C)   Resp 16   Ht 5\' 8"  (1.727 m)   Wt 178 lb 3.2 oz (80.8 kg)   BMI 27.10 kg/m   Appears  well nourished, well groomed  and in no distress.  Eyes: PERRLA, EOMs, conjunctiva no swelling or erythema. Sinuses: No frontal/maxillary tenderness ENT/Mouth: EAC's clear, TM's nl w/o erythema, bulging. Nares clear w/o erythema, swelling, exudates. Oropharynx clear without erythema or exudates. Oral hygiene is good. Tongue normal, non obstructing. Hearing intact.  Neck: Supple. Thyroid not palpable. Car 2+/2+ without bruits, nodes or JVD. Chest: Respirations nl with BS clear & equal w/o rales, rhonchi, wheezing or stridor.  Cor: Heart sounds normal w/ regular rate and rhythm without sig. murmurs, gallops, clicks or rubs. Peripheral pulses normal and equal  without edema.  Abdomen: Soft & bowel sounds normal. Non-tender w/o guarding, rebound, hernias, masses or organomegaly.  Lymphatics: Unremarkable.  Musculoskeletal: Full ROM all peripheral extremities, joint stability, 5/5 strength and normal gait.  Skin: Warm, dry without exposed rashes, lesions or ecchymosis apparent.  Neuro: Cranial nerves intact, reflexes equal bilaterally. Sensory-motor testing grossly intact. Tendon reflexes grossly intact.  Pysch: Alert & oriented x 3.  Insight and judgement nl & appropriate. No ideations.  Assessment and Plan:  1. Essential hypertension  - Continue medication, monitor blood pressure at home.  - Continue DASH diet.  Reminder to go to the ER if any CP,  SOB, nausea, dizziness, severe  HA, changes vision/speech.  - CBC with Differential/Platelet - COMPLETE METABOLIC PANEL WITH GFR - Magnesium - TSH  2. Hyperlipidemia, mixed  - Continue diet/meds, exercise,& lifestyle modifications.  - Continue monitor periodic cholesterol/liver & renal functions   - Lipid panel - TSH  3. Abnormal glucose  - Hemoglobin A1c - Insulin, random  4. Vitamin D deficiency  - VITAMIN D 25 Hydroxyl  5. Prediabetes  - Hemoglobin A1c - Insulin, random  6. Mild persistent asthma without complication  - Continue diet, exercise  - Lifestyle modifications.  - Monitor appropriate labs. - Continue supplementation.   7. Medication management  - CBC with Differential/Platelet - COMPLETE METABOLIC PANEL WITH GFR - Magnesium - Lipid panel - TSH - Hemoglobin A1c - Insulin, random - VITAMIN D 25 Hydroxyl        Discussed  regular exercise, BP monitoring, weight control to achieve/maintain BMI less than 25 and discussed med and SE's. Recommended labs to assess and monitor clinical status with further disposition pending results of labs.  I discussed the assessment and treatment plan with the patient. The patient was provided an opportunity to ask questions and all were answered. The patient agreed with the plan and demonstrated an understanding of the instructions.  I provided over 30 minutes of exam, counseling, chart review and  complex critical decision making.  Kirtland Bouchard, MD

## 2019-08-03 NOTE — Patient Instructions (Signed)

## 2019-08-04 LAB — CBC WITH DIFFERENTIAL/PLATELET
Absolute Monocytes: 719 cells/uL (ref 200–950)
Basophils Absolute: 122 cells/uL (ref 0–200)
Basophils Relative: 2.1 %
Eosinophils Absolute: 510 cells/uL — ABNORMAL HIGH (ref 15–500)
Eosinophils Relative: 8.8 %
HCT: 44 % (ref 38.5–50.0)
Hemoglobin: 14.2 g/dL (ref 13.2–17.1)
Lymphs Abs: 1479 cells/uL (ref 850–3900)
MCH: 29.3 pg (ref 27.0–33.0)
MCHC: 32.3 g/dL (ref 32.0–36.0)
MCV: 90.7 fL (ref 80.0–100.0)
MPV: 10.8 fL (ref 7.5–12.5)
Monocytes Relative: 12.4 %
Neutro Abs: 2970 cells/uL (ref 1500–7800)
Neutrophils Relative %: 51.2 %
Platelets: 209 10*3/uL (ref 140–400)
RBC: 4.85 10*6/uL (ref 4.20–5.80)
RDW: 13.3 % (ref 11.0–15.0)
Total Lymphocyte: 25.5 %
WBC: 5.8 10*3/uL (ref 3.8–10.8)

## 2019-08-04 LAB — COMPLETE METABOLIC PANEL WITH GFR
AG Ratio: 1.4 (calc) (ref 1.0–2.5)
ALT: 16 U/L (ref 9–46)
AST: 20 U/L (ref 10–35)
Albumin: 4.2 g/dL (ref 3.6–5.1)
Alkaline phosphatase (APISO): 80 U/L (ref 35–144)
BUN: 13 mg/dL (ref 7–25)
CO2: 29 mmol/L (ref 20–32)
Calcium: 9.4 mg/dL (ref 8.6–10.3)
Chloride: 103 mmol/L (ref 98–110)
Creat: 1.09 mg/dL (ref 0.70–1.18)
GFR, Est African American: 75 mL/min/{1.73_m2} (ref >=60)
GFR, Est Non African American: 65 mL/min/{1.73_m2} (ref >=60)
Globulin: 3 g/dL (calc) (ref 1.9–3.7)
Glucose, Bld: 87 mg/dL (ref 65–99)
Potassium: 4.2 mmol/L (ref 3.5–5.3)
Sodium: 140 mmol/L (ref 135–146)
Total Bilirubin: 0.5 mg/dL (ref 0.2–1.2)
Total Protein: 7.2 g/dL (ref 6.1–8.1)

## 2019-08-04 LAB — LIPID PANEL
Cholesterol: 163 mg/dL (ref <200)
HDL: 56 mg/dL (ref >=40)
LDL Cholesterol (Calc): 87 mg/dL (calc)
Non-HDL Cholesterol (Calc): 107 mg/dL (calc) (ref <130)
Total CHOL/HDL Ratio: 2.9 (calc) (ref <5.0)
Triglycerides: 102 mg/dL (ref <150)

## 2019-08-04 LAB — HEMOGLOBIN A1C
Hgb A1c MFr Bld: 5.9 % of total Hgb — ABNORMAL HIGH (ref <5.7)
Mean Plasma Glucose: 123 (calc)
eAG (mmol/L): 6.8 (calc)

## 2019-08-04 LAB — MAGNESIUM: Magnesium: 2.2 mg/dL (ref 1.5–2.5)

## 2019-08-04 LAB — INSULIN, RANDOM: Insulin: 3.3 u[IU]/mL

## 2019-08-04 LAB — VITAMIN D 25 HYDROXY (VIT D DEFICIENCY, FRACTURES): Vit D, 25-Hydroxy: >150 ng/mL — ABNORMAL HIGH (ref 30–100)

## 2019-08-04 LAB — TSH: TSH: 1.54 mIU/L (ref 0.40–4.50)

## 2019-11-02 NOTE — Progress Notes (Signed)
FOLLOW UP  Assessment and Plan:   Hypertension Fairly controlled with current medications  Monitor blood pressure at home; patient to call if consistently greater than 130/80 Continue DASH diet.   Reminder to go to the ER if any CP, SOB, nausea, dizziness, severe HA, changes vision/speech, left arm numbness and tingling and jaw pain.  Cholesterol Continue medications  Continue low cholesterol diet and exercise.  Check lipid panel.   Prediabetes Continue diet and exercise.  Perform daily foot/skin check, notify office of any concerning changes.  Check A1C q24m; defer today, check serum glucose (CMP), monitor weight trend  Vitamin D Def/ osteoporosis prevention Above goal last check - did not change dose as recommended Reviewed Dr. Idell Pickles recommendations - hold 50000 IU twice weekly, then restart taking 10000 IU daily (dose reduction 100000 IU > 70000 IU/week) Goal range 60-100 Defer Vit D level as no dose change was made  Asthma, mild, persistant He reports asthma with increased symptoms in the fall and winter seasons; well managed with current regimen when he does take his medications.  Continue diet and meds as discussed. Further disposition pending results of labs. Discussed med's effects and SE's.   Over 30 minutes of exam, counseling, chart review, and critical decision making was performed.   Future Appointments  Date Time Provider [place]  02/09/2020  9:00 AM Unk Pinto, MD [facility] None  05/16/2020  3:00 PM Liane Comber, NP [facility] None    ----------------------------------------------------------------------------------------------------------------------  HPI 78 y.o. male  presents for 3 month follow up on hypertension, cholesterol, prediabetes and vitamin D deficiency.   He has mild persistent asthma, on advair, PRN albuterol, uses 1-2 times per week during the fall/winter seasons as he typically does. He also takes singulair and zyrtec.    BMI is Body mass index is 26.76 kg/m., he has been working on diet and exercise, walking, [facility].  Wt Readings from Last 3 Encounters:  11/03/19 176 lb (79.8 kg)  08/03/19 178 lb 3.2 oz (80.8 kg)  05/25/19 175 lb (79.4 kg)   His blood pressure has been controlled at home (120s/80s), today their BP is BP: 124/72   He does workout. He denies chest pain, shortness of breath, dizziness.   He is on cholesterol medication (rosuvastatin 40 mg daily) and denies myalgias. His cholesterol is at goal. The cholesterol last visit was:   Lab Results  Component Value Date   CHOL 163 08/03/2019   HDL 56 08/03/2019   LDLCALC 87 08/03/2019   TRIG 102 08/03/2019   CHOLHDL 2.9 08/03/2019    He has been working on diet and exercise for prediabetes, and denies increased appetite, nausea, paresthesia of the feet, polydipsia, polyuria, visual disturbances and vomiting. Last A1C in the office was:  Lab Results  Component Value Date   HGBA1C 5.9 (H) 08/03/2019    He has stable CKD II assocaited with htn and prediabetes monitored at this office:  Lab Results  Component Value Date   GFRNONAA 65 08/03/2019   Patient is on Vitamin D supplement and above goal at last check, has been taking 50000 IU twice a week, was advised to stop for 1 week then switch to 10000 IU daily but has not done so:  Lab Results  Component Value Date   VD25OH >150 (H) 08/03/2019      Current Medications:  Current Outpatient Medications on File Prior to Visit  Medication Sig  . cetirizine (ZYRTEC) 10 MG  tablet TAKE 1 TABLET DAILY FOR ALLERGIES  . Fluticasone-Salmeterol (ADVAIR DISKUS) 250-50 MCG/DOSE AEPB USE 1 INHALATION TWICE A DAY  . Ipratropium-Albuterol (COMBIVENT RESPIMAT) 20-100 MCG/ACT AERS respimat Inhale 1 puff into the lungs every 4 (four) hours as needed for wheezing.  Marland Kitchen losartan (COZAAR) 50 MG tablet TAKE 1 TABLET DAILY FOR BLOOD PRESSURE  . montelukast  (SINGULAIR) 10 MG tablet TAKE 1 TABLET DAILY FOR ALLERGY AND ASTHMA  . rosuvastatin (CRESTOR) 40 MG tablet Take 1 tablet Daily for Cholesterol  . verapamil (CALAN-SR) 240 MG CR tablet Take 1 tablet (240 mg total) by mouth daily. with food  . Vitamin D, Ergocalciferol, (DRISDOL) 1.25 MG (50000 UT) CAPS capsule TAKE 1 CAPSULE DAILY FOR SEVERE VITAMIN D DEFICIENCY   No current facility-administered medications on file prior to visit.     Allergies:  Allergies  Allergen Reactions  . Mavik [Trandolapril]     Cough  . Hctz [Hydrochlorothiazide] Rash    Photosensitivity     Medical History:  Past Medical History:  Diagnosis Date  . Allergy   . Asthma   . BPH (benign prostatic hyperplasia)   . GERD (gastroesophageal reflux disease)   . History of colon cancer   . Hyperlipidemia   . Hypertension   . Prediabetes   . Vitamin D deficiency    Family history- Reviewed and unchanged Social history- Reviewed and unchanged   Review of Systems:  Review of Systems  Constitutional: Negative.  Negative for malaise/fatigue and weight loss.  HENT: Positive for congestion. Negative for ear discharge, ear pain, hearing loss, sinus pain, sore throat and tinnitus.   Eyes: Negative.  Negative for blurred vision and double vision.  Respiratory: Negative for cough (cough up secretions in AM; chronic), shortness of breath and wheezing.   Cardiovascular: Negative.  Negative for chest pain, palpitations, orthopnea, claudication and leg swelling.  Gastrointestinal: Negative.  Negative for abdominal pain, blood in stool, constipation, diarrhea, heartburn, melena, nausea and vomiting.  Genitourinary: Negative.   Musculoskeletal: Negative.  Negative for joint pain and myalgias.  Skin: Negative.  Negative for rash.  Neurological: Negative.  Negative for dizziness, tingling, sensory change, weakness and headaches.  Endo/Heme/Allergies: Negative.  Negative for polydipsia.  Psychiatric/Behavioral: Negative.    All other systems reviewed and are negative.    Physical Exam: BP 124/72   Pulse 82   Temp 98.2 F (36.8 C)   Wt 176 lb (79.8 kg)   SpO2 95%   BMI 26.76 kg/m  Wt Readings from Last 3 Encounters:  11/03/19 176 lb (79.8 kg)  08/03/19 178 lb 3.2 oz (80.8 kg)  05/25/19 175 lb (79.4 kg)   General Appearance: Well nourished, in no apparent distress. Eyes: PERRLA, EOMs, conjunctiva no swelling or erythema Sinuses: No Frontal/maxillary tenderness ENT/Mouth: Ext aud canals clear, TMs without erythema, bulging. Mask in place; oral exam deferred. Hearing normal.  Neck: Supple, thyroid normal.  Respiratory: Respiratory effort normal, bilaterally clear throughout  without rales, rhonchi, wheezing or stridor.  Cardio: RRR with no MRGs. Brisk peripheral pulses without edema.  Abdomen: Soft, + BS.  Non tender, no guarding, rebound, hernias, masses. Lymphatics: Non tender without lymphadenopathy.  Musculoskeletal: Full ROM, 5/5 strength, Normal gait Skin: Warm, dry without rashes, lesions, ecchymosis.  Neuro: Cranial nerves intact. No cerebellar symptoms.  Psych: Awake and oriented X 3, normal affect, Insight and Judgment appropriate.    Izora Ribas, NP 9:04 AM [facility]

## 2019-11-03 ENCOUNTER — Encounter: Payer: Self-pay | Admitting: Adult Health

## 2019-11-03 ENCOUNTER — Other Ambulatory Visit: Payer: Self-pay

## 2019-11-03 ENCOUNTER — Ambulatory Visit (INDEPENDENT_AMBULATORY_CARE_PROVIDER_SITE_OTHER): Payer: Medicare PPO | Admitting: Adult Health

## 2019-11-03 VITALS — BP 124/72 | HR 82 | Temp 98.2°F | Wt 176.0 lb

## 2019-11-03 DIAGNOSIS — I10 Essential (primary) hypertension: Secondary | ICD-10-CM

## 2019-11-03 DIAGNOSIS — E559 Vitamin D deficiency, unspecified: Secondary | ICD-10-CM | POA: Diagnosis not present

## 2019-11-03 DIAGNOSIS — Z79899 Other long term (current) drug therapy: Secondary | ICD-10-CM | POA: Diagnosis not present

## 2019-11-03 DIAGNOSIS — E782 Mixed hyperlipidemia: Secondary | ICD-10-CM

## 2019-11-03 DIAGNOSIS — R7303 Prediabetes: Secondary | ICD-10-CM

## 2019-11-03 DIAGNOSIS — E663 Overweight: Secondary | ICD-10-CM

## 2019-11-03 DIAGNOSIS — J4530 Mild persistent asthma, uncomplicated: Secondary | ICD-10-CM | POA: Diagnosis not present

## 2019-11-03 NOTE — Patient Instructions (Addendum)
Goals    . DIET - INCREASE WATER INTAKE     5-6 bottles daily     . HEMOGLOBIN A1C < 5.7       STOP vitamin D for 2 weeks - then restart with 10000 IU daily per Dr. Idell Pickles instructions      Nonallergic Rhinitis Nonallergic rhinitis is a condition that causes symptoms that affect the nose, such as a runny nose and a stuffed-up nose (nasal congestion) that can make it hard to breathe through the nose. This condition is different from having an allergy (allergic rhinitis). Allergic rhinitis occurs when the body's defense system (immune system) reacts to a substance that you are allergic to (allergen), such as pollen, pet dander, mold, or dust. Nonallergic rhinitis has many similar symptoms, but it is not caused by allergens. Nonallergic rhinitis can be a short-term or long-term problem. What are the causes? This condition can be caused by many different things. Some common types of nonallergic rhinitis include: Infectious rhinitis  This is usually due to an infection in the upper respiratory tract. Vasomotor rhinitis  This is the most common type of long-term nonallergic rhinitis.  It is caused by too much blood flow through the nose, which makes the tissue inside of the nose swell.  Symptoms are often triggered by strong odors, cold air, stress, drinking alcohol, cigarette smoke, or changes in the weather. Occupational rhinitis  This type is caused by triggers in the workplace, such as chemicals, dusts, animal dander, or air pollution. Hormonal rhinitis  This type occurs in women as a result of an increase in the male hormone estrogen.  It may occur during pregnancy, puberty, and menstrual cycles.  Symptoms improve when estrogen levels drop. Drug-induced rhinitis Several drugs can cause nonallergic rhinitis, including:  Medicines that are used to treat high blood pressure, heart disease, and Parkinson disease.  Aspirin and NSAIDs.  Over-the-counter nasal decongestant  sprays. These can cause a type of nonallergic rhinitis (rhinitis medicamentosa) when they are used for more than a few days. Nonallergic rhinitis with eosinophilia syndrome (NARES)  This type is caused by having too much of a certain type of white blood cell (eosinophil). Nonallergic rhinitis can also be caused by a reaction to eating hot or spicy foods. This does not usually cause long-term symptoms. In some cases, the cause of nonallergic rhinitis is not known. What increases the risk? You are more likely to develop this condition if:  You are 89-60 years of age.  You are a woman. Women are twice as likely to have this condition. What are the signs or symptoms? Common symptoms of this condition include:  Nasal congestion.  Runny nose.  The feeling of mucus going down the back of the throat (postnasal drip).  Trouble sleeping at night and daytime sleepiness. Less common symptoms include:  Sneezing.  Coughing.  Itchy nose.  Bloodshot eyes. How is this diagnosed? This condition may be diagnosed based on:  Your symptoms and medical history.  A physical exam.  Allergy testing to rule out allergic rhinitis. You may have skin tests or blood tests. In some cases, the health care provider may take a swab of nasal secretions to look for an increased number of eosinophils. This would be done to confirm a diagnosis of NARES. How is this treated? Treatment for this condition depends on the cause. No single treatment works for everyone. Work with your health care provider to find the best treatment for you. Treatment may include:  Avoiding the  things that trigger your symptoms.  Using medicines to relieve congestion, such as: ? Steroid nasal spray. There are many types. You may need to try a few to find out which one works best. ? Decongestant medicine. This may be an oral medicine or a nasal spray. These medicines are only used for a short time.  Using medicines to relieve a  runny nose. These may include antihistamine medicines or anticholinergic nasal sprays.  Surgery to remove tissue from inside the nose may be needed in severe cases if the condition has not improved after 6-12 months of medical treatment. Follow these instructions at home:  Take or use over-the-counter and prescription medicines only as told by your health care provider. Do not stop using your medicine even if you start to feel better.  Use salt-water (saline) rinses or other solutions (nasal washes or irrigations) to wash or rinse out the inside of your nose as told by your health care provider.  Do not take NSAIDs or medicines that contain aspirin if they make your symptoms worse.  Do not drink alcohol if it makes your symptoms worse.  Do not use any tobacco products, such as cigarettes, chewing tobacco, and e-cigarettes. If you need help quitting, ask your health care provider.  Avoid secondhand smoke.  Get some exercise every day. Exercise may help reduce symptoms of nonallergic rhinitis for some people. Ask your health care provider how much exercise and what types of exercise are safe for you.  Sleep with the head of your bed raised (elevated). This may reduce nighttime nasal congestion.  Keep all follow-up visits as told by your health care provider. This is important. Contact a health care provider if:  You have a fever.  Your symptoms are getting worse at home.  Your symptoms are not responding to medicine.  You develop new symptoms, especially a headache or nosebleed. This information is not intended to replace advice given to you by your health care provider. Make sure you discuss any questions you have with your health care provider. Document Revised: 10/02/2017 Document Reviewed: 01/10/2016 Elsevier Patient Education  [place].

## 2019-11-04 LAB — LIPID PANEL
Cholesterol: 147 mg/dL (ref <200)
HDL: 61 mg/dL (ref >=40)
LDL Cholesterol (Calc): 72 mg/dL (calc)
Non-HDL Cholesterol (Calc): 86 mg/dL (calc) (ref <130)
Total CHOL/HDL Ratio: 2.4 (calc) (ref <5.0)
Triglycerides: 64 mg/dL (ref <150)

## 2019-11-04 LAB — CBC WITH DIFFERENTIAL/PLATELET
Absolute Monocytes: 572 cells/uL (ref 200–950)
Basophils Absolute: 110 cells/uL (ref 0–200)
Basophils Relative: 2 %
Eosinophils Absolute: 578 cells/uL — ABNORMAL HIGH (ref 15–500)
Eosinophils Relative: 10.5 %
HCT: 41.9 % (ref 38.5–50.0)
Hemoglobin: 13.8 g/dL (ref 13.2–17.1)
Lymphs Abs: 1568 cells/uL (ref 850–3900)
MCH: 29.7 pg (ref 27.0–33.0)
MCHC: 32.9 g/dL (ref 32.0–36.0)
MCV: 90.1 fL (ref 80.0–100.0)
MPV: 10.8 fL (ref 7.5–12.5)
Monocytes Relative: 10.4 %
Neutro Abs: 2673 cells/uL (ref 1500–7800)
Neutrophils Relative %: 48.6 %
Platelets: 226 10*3/uL (ref 140–400)
RBC: 4.65 10*6/uL (ref 4.20–5.80)
RDW: 13.1 % (ref 11.0–15.0)
Total Lymphocyte: 28.5 %
WBC: 5.5 10*3/uL (ref 3.8–10.8)

## 2019-11-04 LAB — COMPLETE METABOLIC PANEL WITH GFR
AG Ratio: 1.4 (calc) (ref 1.0–2.5)
ALT: 24 U/L (ref 9–46)
AST: 23 U/L (ref 10–35)
Albumin: 4.2 g/dL (ref 3.6–5.1)
Alkaline phosphatase (APISO): 111 U/L (ref 35–144)
BUN: 17 mg/dL (ref 7–25)
CO2: 29 mmol/L (ref 20–32)
Calcium: 9.5 mg/dL (ref 8.6–10.3)
Chloride: 104 mmol/L (ref 98–110)
Creat: 0.94 mg/dL (ref 0.70–1.18)
GFR, Est African American: 90 mL/min/{1.73_m2} (ref >=60)
GFR, Est Non African American: 77 mL/min/{1.73_m2} (ref >=60)
Globulin: 3.1 g/dL (calc) (ref 1.9–3.7)
Glucose, Bld: 94 mg/dL (ref 65–99)
Potassium: 4.3 mmol/L (ref 3.5–5.3)
Sodium: 140 mmol/L (ref 135–146)
Total Bilirubin: 0.5 mg/dL (ref 0.2–1.2)
Total Protein: 7.3 g/dL (ref 6.1–8.1)

## 2019-11-04 LAB — TSH: TSH: 1.04 mIU/L (ref 0.40–4.50)

## 2019-11-04 LAB — MAGNESIUM: Magnesium: 2.4 mg/dL (ref 1.5–2.5)

## 2019-11-26 ENCOUNTER — Other Ambulatory Visit: Payer: Self-pay | Admitting: Adult Health

## 2019-12-08 DIAGNOSIS — Z20828 Contact with and (suspected) exposure to other viral communicable diseases: Secondary | ICD-10-CM | POA: Diagnosis not present

## 2019-12-17 ENCOUNTER — Ambulatory Visit: Payer: Medicare PPO | Attending: Internal Medicine

## 2019-12-17 DIAGNOSIS — Z23 Encounter for immunization: Secondary | ICD-10-CM | POA: Insufficient documentation

## 2019-12-17 NOTE — Progress Notes (Signed)
   Covid-19 Vaccination Clinic  Name:  Jon [name]    MRN: [id number] DOB: Aug 22, 1941  12/17/2019  [name] was observed post Covid-19 immunization for 15 minutes without incidence. He was provided with Vaccine Information Sheet and instruction to access the V-Safe system.   [name] was instructed to call 911 with any severe reactions post vaccine: Marland Kitchen Difficulty breathing  . Swelling of your face and throat  . A fast heartbeat  . A bad rash all over your body  . Dizziness and weakness    Immunizations Administered    Name Date Dose VIS Date Route   Pfizer COVID-19 Vaccine 12/17/2019  9:33 AM 0.3 mL 10/14/2019 Intramuscular   Manufacturer: [place]   Lot: [id number]   [place]: [id number]

## 2020-01-08 ENCOUNTER — Ambulatory Visit: Payer: Medicare PPO | Attending: Internal Medicine

## 2020-01-08 DIAGNOSIS — Z23 Encounter for immunization: Secondary | ICD-10-CM | POA: Insufficient documentation

## 2020-01-08 NOTE — Progress Notes (Signed)
   Covid-19 Vaccination Clinic  Name:  Jon [name]    MRN: [id number] DOB: 05/20/1941  01/08/2020  [name] was observed post Covid-19 immunization for 15 minutes without incident. He was provided with Vaccine Information Sheet and instruction to access the V-Safe system.   [name] was instructed to call 911 with any severe reactions post vaccine: Marland Kitchen Difficulty breathing  . Swelling of face and throat  . A fast heartbeat  . A bad rash all over body  . Dizziness and weakness   Immunizations Administered    Name Date Dose VIS Date Route   Pfizer COVID-19 Vaccine 01/08/2020  2:34 PM 0.3 mL 10/14/2019 Intramuscular   Manufacturer: [place]   Lot: [id number]   [place]: [id number]

## 2020-01-18 ENCOUNTER — Other Ambulatory Visit: Payer: Self-pay

## 2020-01-18 ENCOUNTER — Encounter: Payer: Self-pay | Admitting: Adult Health

## 2020-01-18 ENCOUNTER — Ambulatory Visit (INDEPENDENT_AMBULATORY_CARE_PROVIDER_SITE_OTHER): Payer: Medicare PPO | Admitting: Adult Health

## 2020-01-18 VITALS — BP 128/80 | HR 84 | Temp 97.5°F | Ht 68.0 in | Wt 177.0 lb

## 2020-01-18 DIAGNOSIS — M62830 Muscle spasm of back: Secondary | ICD-10-CM

## 2020-01-18 DIAGNOSIS — M545 Low back pain, unspecified: Secondary | ICD-10-CM

## 2020-01-18 NOTE — Patient Instructions (Signed)
Stretching daily - try to become more flexible through hamstrings Wear supportive shoes at work    Back Exercises The following exercises strengthen the muscles that help to support the trunk and back. They also help to keep the lower back flexible. Doing these exercises can help to prevent back pain or lessen existing pain.  If you have back pain or discomfort, try doing these exercises 2-3 times each day or as told by your health care provider.  As your pain improves, do them once each day, but increase the number of times that you repeat the steps for each exercise (do more repetitions).  To prevent the recurrence of back pain, continue to do these exercises once each day or as told by your health care provider. Do exercises exactly as told by your health care provider and adjust them as directed. It is normal to feel mild stretching, pulling, tightness, or discomfort as you do these exercises, but you should stop right away if you feel sudden pain or your pain gets worse. Exercises Single knee to chest Repeat these steps 3-5 times for each leg: 1. Lie on your back on a firm bed or the floor with your legs extended. 2. Bring one knee to your chest. Your other leg should stay extended and in contact with the floor. 3. Hold your knee in place by grabbing your knee or thigh with both hands and hold. 4. Pull on your knee until you feel a gentle stretch in your lower back or buttocks. 5. Hold the stretch for 10-30 seconds. 6. Slowly release and straighten your leg. Pelvic tilt Repeat these steps 5-10 times: 1. Lie on your back on a firm bed or the floor with your legs extended. 2. Bend your knees so they are pointing toward the ceiling and your feet are flat on the floor. 3. Tighten your lower abdominal muscles to press your lower back against the floor. This motion will tilt your pelvis so your tailbone points up toward the ceiling instead of pointing to your feet or the floor. 4. With  gentle tension and even breathing, hold this position for 5-10 seconds. Cat-cow Repeat these steps until your lower back becomes more flexible: 1. Get into a hands-and-knees position on a firm surface. Keep your hands under your shoulders, and keep your knees under your hips. You may place padding under your knees for comfort. 2. Let your head hang down toward your chest. Contract your abdominal muscles and point your tailbone toward the floor so your lower back becomes rounded like the back of a cat. 3. Hold this position for 5 seconds. 4. Slowly lift your head, let your abdominal muscles relax and point your tailbone up toward the ceiling so your back forms a sagging arch like the back of a cow. 5. Hold this position for 5 seconds.  Press-ups Repeat these steps 5-10 times: 1. Lie on your abdomen (face-down) on the floor. 2. Place your palms near your head, about shoulder-width apart. 3. Keeping your back as relaxed as possible and keeping your hips on the floor, slowly straighten your arms to raise the top half of your body and lift your shoulders. Do not use your back muscles to raise your upper torso. You may adjust the placement of your hands to make yourself more comfortable. 4. Hold this position for 5 seconds while you keep your back relaxed. 5. Slowly return to lying flat on the floor.  Bridges Repeat these steps 10 times: 1. Lie on  your back on a firm surface. 2. Bend your knees so they are pointing toward the ceiling and your feet are flat on the floor. Your arms should be flat at your sides, next to your body. 3. Tighten your buttocks muscles and lift your buttocks off the floor until your waist is at almost the same height as your knees. You should feel the muscles working in your buttocks and the back of your thighs. If you do not feel these muscles, slide your feet 1-2 inches farther away from your buttocks. 4. Hold this position for 3-5 seconds. 5. Slowly lower your hips to the  starting position, and allow your buttocks muscles to relax completely. If this exercise is too easy, try doing it with your arms crossed over your chest. Abdominal crunches Repeat these steps 5-10 times: 1. Lie on your back on a firm bed or the floor with your legs extended. 2. Bend your knees so they are pointing toward the ceiling and your feet are flat on the floor. 3. Cross your arms over your chest. 4. Tip your chin slightly toward your chest without bending your neck. 5. Tighten your abdominal muscles and slowly raise your trunk (torso) high enough to lift your shoulder blades a tiny bit off the floor. Avoid raising your torso higher than that because it can put too much stress on your low back and does not help to strengthen your abdominal muscles. 6. Slowly return to your starting position. Back lifts Repeat these steps 5-10 times: 1. Lie on your abdomen (face-down) with your arms at your sides, and rest your forehead on the floor. 2. Tighten the muscles in your legs and your buttocks. 3. Slowly lift your chest off the floor while you keep your hips pressed to the floor. Keep the back of your head in line with the curve in your back. Your eyes should be looking at the floor. 4. Hold this position for 3-5 seconds. 5. Slowly return to your starting position. Contact a health care provider if:  Your back pain or discomfort gets much worse when you do an exercise.  Your worsening back pain or discomfort does not lessen within 2 hours after you exercise. If you have any of these problems, stop doing these exercises right away. Do not do them again unless your health care provider says that you can. Get help right away if:  You develop sudden, severe back pain. If this happens, stop doing the exercises right away. Do not do them again unless your health care provider says that you can. This information is not intended to replace advice given to you by your health care provider. Make sure  you discuss any questions you have with your health care provider. Document Revised: 02/24/2019 Document Reviewed: 07/22/2018 Elsevier Patient Education  [place].

## 2020-01-18 NOTE — Progress Notes (Signed)
Assessment and Plan:  Jon [name] was seen today for follow-up.  Diagnoses and all orders for this visit:  Acute bilateral low back pain without sciatica Lumbar paraspinal muscle spasm Sx resolved at time of exam, unremarkable exam other than tightness in quads, evidence of knee arthritis (est with ortho getting injections), history most suggestive of lumbar muscle spasm  - negative straight leg Natural history and expected course discussed. Questions answered. Neurosurgeon distributed. Proper lifting, bending technique discussed. Stretching exercises discussed. Regular aerobic and trunk strengthening exercises discussed. Heat to affected area as needed for local pain relief. OTC analgesics as needed. Follow up if persistent recurrent lumbar pain, or new characteristics (midline, radicular sx, etc).   He is appreciative and in agreement with above plan.    Further disposition pending results of labs. Discussed med's effects and SE's.   Over 15 minutes of exam, counseling, chart review, and critical decision making was performed.   Future Appointments  Date Time Provider [place]  01/18/2020  8:45 AM Liane Comber, NP [facility] None  02/09/2020  9:00 AM Unk Pinto, MD [facility] None  05/16/2020  3:00 PM Liane Comber, NP [facility] None    ------------------------------------------------------------------------------------------------------------------   HPI BP 128/80   Pulse 84   Temp (!) 97.5 F (36.4 C)   Ht 5\' 8"  (1.727 m)   Wt 177 lb (80.3 kg)   SpO2 96%   BMI 26.91 kg/m   79 y.o.male presents for evaluation of back pain. He reports historically has limited episodic lumbar back pain associated with doing too much in the yard. He is R handed male.   He reports 2 days ago he was sitting at home after returning home from work [employer]), no unusual events, injury, heavy lifting, suddenly had a clenching sensation of his lower back bilaterally but  worse on R, "grabbed him", just discomfort, denies pain/weakness, radiation, numbness/tingling, fever/chills, loss of bladder or bowel control.   Took 2 tab extra strength tylenol which did help significantly, also used heating pad, did this all day yesterday, today reports symptoms have essentially resolved, just wanted to come in and get checked out to be sure.    Past Medical History:  Diagnosis Date  . Allergy   . Asthma   . BPH (benign prostatic hyperplasia)   . GERD (gastroesophageal reflux disease)   . History of colon cancer   . Hyperlipidemia   . Hypertension   . Prediabetes   . Vitamin D deficiency      Allergies  Allergen Reactions  . Mavik [Trandolapril]     Cough  . Hctz [Hydrochlorothiazide] Rash    Photosensitivity    Current Outpatient Medications on File Prior to Visit  Medication Sig  . cetirizine (ZYRTEC) 10 MG tablet TAKE 1 TABLET DAILY FOR ALLERGIES  . Fluticasone-Salmeterol (ADVAIR DISKUS) 250-50 MCG/DOSE AEPB USE 1 INHALATION TWICE A DAY  . Ipratropium-Albuterol (COMBIVENT RESPIMAT) 20-100 MCG/ACT AERS respimat Inhale 1 puff into the lungs every 4 (four) hours as needed for wheezing.  Marland Kitchen losartan (COZAAR) 50 MG tablet TAKE 1 TABLET DAILY FOR BLOOD PRESSURE  . montelukast (SINGULAIR) 10 MG tablet TAKE 1 TABLET DAILY FOR ALLERGY AND ASTHMA  . rosuvastatin (CRESTOR) 40 MG tablet Take 1 tablet Daily for Cholesterol  . verapamil (CALAN-SR) 240 MG CR tablet TAKE 1 TABLET DAILY WITH FOOD  . Vitamin D, Ergocalciferol, (DRISDOL) 1.25 MG (50000 UT) CAPS capsule TAKE 1 CAPSULE DAILY FOR SEVERE VITAMIN D DEFICIENCY (Patient taking differently: 70,000 Units. TAKE 1 CAPSULE DAILY  FOR SEVERE VITAMIN D DEFICIENCY)   No current facility-administered medications on file prior to visit.    ROS: all negative except above.   Physical Exam:  BP 128/80   Pulse 84   Temp (!) 97.5 F (36.4 C)   Ht 5\' 8"  (1.727 m)   Wt 177 lb (80.3 kg)   SpO2 96%   BMI 26.91 kg/m    General Appearance: Well nourished, in no apparent distress. Eyes: conjunctiva no swelling or erythema ENT/Mouth: Hearing normal.  Neck: Supple Respiratory: Respiratory effort normal Cardio: Brisk peripheral pulses without edema.  Abdomen: Soft, + BS.  Non tender Lymphatics: Non tender without lymphadenopathy.  Musculoskeletal: Patient is able to ambulate well. Gait is not  Antalgic. Straight leg raising with dorsiflexion negative bilaterally for radicular symptoms. Sensory exam in the legs are normal. Knee reflexes are normal Ankle reflexes are normal Strength is normal and symmetric in arms and legs. There is not SI tenderness to palpation.  There is not paraspinal muscle spasm.  There is not midline tenderness.  ROM of spine intact without pain elicited during exam today. He does have tightness through bil hamstrings, crepitus in R knee limiting full extension.  Skin: Warm, dry without rashes, lesions, ecchymosis.  Neuro: Normal muscle tone, Sensation intact.  Psych: Awake and oriented X 3, normal affect, Insight and Judgment appropriate.     Izora Ribas, NP 8:44 AM [facility]

## 2020-02-08 ENCOUNTER — Encounter: Payer: Self-pay | Admitting: Internal Medicine

## 2020-02-08 NOTE — Patient Instructions (Signed)

## 2020-02-08 NOTE — Progress Notes (Signed)
Annual  Screening/Preventative Visit  & Comprehensive Evaluation & Examination     This very nice 79 y.o. married 55 man presents for a Screening /Preventative Visit & comprehensive evaluation and management of multiple medical co-morbidities.  Patient has been followed for HTN, HLD, Prediabetes and Vitamin D Deficiency. Patient has hx/o Colon cancer ressection in 2004.     HTN predates circa 1996 Patient's BP has been controlled at home.  Today's BP is at goal - 128/84. Patient denies any cardiac symptoms as chest pain, palpitations, shortness of breath, dizziness or ankle swelling.     Patient's hyperlipidemia is controlled with diet and Rosuvastatin. Patient denies myalgias or other medication SE's. Last lipids were Normal & at goal:  Lab Results  Component Value Date   CHOL 147 11/03/2019   HDL 61 11/03/2019   LDLCALC 72 11/03/2019   TRIG 64 11/03/2019   CHOLHDL 2.4 11/03/2019       Patient has hx/o prediabetes (A1c 6.3% / 2013)  and patient denies reactive hypoglycemic symptoms, visual blurring, diabetic polys or paresthesias. Last A1c was not at goal:  Lab Results  Component Value Date   HGBA1C 5.9 (H) 08/03/2019        Finally, patient has history of Vitamin D Deficiency ("24" / 2008) and last vitamin D was elevated & dose was decreased from 50,000 u down to 10,000 units daily:  Lab Results  Component Value Date   VD25OH >150 (H) 08/03/2019    Current Outpatient Medications on File Prior to Visit  Medication Sig  . cetirizine (ZYRTEC) 10 MG tablet TAKE 1 TABLET DAILY FOR ALLERGIES  . Fluticasone-Salmeterol (ADVAIR DISKUS) 250-50 MCG/DOSE AEPB USE 1 INHALATION TWICE A DAY  . Ipratropium-Albuterol (COMBIVENT RESPIMAT) 20-100 MCG/ACT AERS respimat Inhale 1 puff into the lungs every 4 (four) hours as needed for wheezing.  Marland Kitchen losartan (COZAAR) 50 MG tablet TAKE 1 TABLET DAILY FOR BLOOD PRESSURE  . montelukast (SINGULAIR) 10 MG tablet TAKE 1 TABLET DAILY FOR ALLERGY  AND ASTHMA  . rosuvastatin (CRESTOR) 40 MG tablet Take 1 tablet Daily for Cholesterol  . verapamil (CALAN-SR) 240 MG CR tablet TAKE 1 TABLET DAILY WITH FOOD  . Vitamin D, Ergocalciferol, (DRISDOL) 1.25 MG (50000 UT) CAPS capsule TAKE 1 CAPSULE DAILY FOR SEVERE VITAMIN D DEFICIENCY (Patient taking differently: 70,000 Units. TAKE 1 CAPSULE DAILY FOR SEVERE VITAMIN D DEFICIENCY)   No current facility-administered medications on file prior to visit.   Allergies  Allergen Reactions  . Mavik [Trandolapril]     Cough  . Hctz [Hydrochlorothiazide] Rash    Photosensitivity   Past Medical History:  Diagnosis Date  . Allergy   . Asthma   . BPH (benign prostatic hyperplasia)   . GERD (gastroesophageal reflux disease)   . History of colon cancer   . Hyperlipidemia   . Hypertension   . Prediabetes   . Vitamin D deficiency    Health Maintenance  Topic Date Due  . TETANUS/TDAP  04/20/2019  . INFLUENZA VACCINE  06/03/2020  . PNA vac Low Risk Adult  Completed   Immunization History  Administered Date(s) Administered  . DT (Pediatric) 07/10/2009  . Influenza Split 07/28/2013  . Influenza, High Dose Seasonal PF 09/18/2014, 07/26/2015, 08/18/2016, 07/13/2017, 07/28/2018, 08/03/2019  . PFIZER SARS-COV-2 Vaccination 12/17/2019, 01/08/2020  . Pneumococcal Conjugate-13 02/07/2016  . Pneumococcal Polysaccharide-23 02/22/2007   Last Colon - 03/17/2016 - Dr Earlean Shawl   Recc 5 year  f/u May 2022  Past Surgical History:  Procedure Laterality Date  .  COLON SURGERY  2004   Dr Rosana Hoes   Family History  Problem Relation Age of Onset  . Heart disease Mother   . Cancer Father   . Cancer Sister   . Autoimmune disease Brother    Social History   Socioeconomic History  . Marital status: Married    Spouse name: susan  . Number of children: 1 son & 1 daughter  Occupational History  . Semi-Retired, works part-time cooking at [employer]  . Smoking status: Never Smoker  . Smokeless  tobacco: Never Used  Substance and Sexual Activity  . Alcohol use: No  . Drug use: No  . Sexual activity: Not on file     ROS Constitutional: Denies fever, chills, weight loss/gain, headaches, insomnia,  night sweats or change in appetite. Does c/o fatigue. Eyes: Denies redness, blurred vision, diplopia, discharge, itchy or watery eyes.  ENT: Denies discharge, congestion, post nasal drip, epistaxis, sore throat, earache, hearing loss, dental pain, Tinnitus, Vertigo, Sinus pain or snoring.  Cardio: Denies chest pain, palpitations, irregular heartbeat, syncope, dyspnea, diaphoresis, orthopnea, PND, claudication or edema Respiratory: denies cough, dyspnea, DOE, pleurisy, hoarseness, laryngitis or wheezing.  Gastrointestinal: Denies dysphagia, heartburn, reflux, water brash, pain, cramps, nausea, vomiting, bloating, diarrhea, constipation, hematemesis, melena, hematochezia, jaundice or hemorrhoids Genitourinary: Denies dysuria, frequency, urgency, nocturia, hesitancy, discharge, hematuria or flank pain Musculoskeletal: Denies arthralgia, myalgia, stiffness, Jt. Swelling, pain, limp or strain/sprain. Denies Falls. Skin: Denies puritis, rash, hives, warts, acne, eczema or change in skin lesion Neuro: No weakness, tremor, incoordination, spasms, paresthesia or pain Psychiatric: Denies confusion, memory loss or sensory loss. Denies Depression. Endocrine: Denies change in weight, skin, hair change, nocturia, and paresthesia, diabetic polys, visual blurring or hyper / hypo glycemic episodes.  Heme/Lymph: No excessive bleeding, bruising or enlarged lymph nodes.  Physical Exam  BP 128/84   Pulse 72   Temp (!) 96.9 F (36.1 C)   Resp 16   Ht 5' 7.25" (1.708 m)   Wt 174 lb 12.8 oz (79.3 kg)   BMI 27.17 kg/m   General Appearance: Well nourished and well groomed and in no apparent distress.  Eyes: PERRLA, EOMs, conjunctiva no swelling or erythema, normal fundi and vessels. Sinuses: No  frontal/maxillary tenderness ENT/Mouth: EACs patent / TMs  nl. Nares clear without erythema, swelling, mucoid exudates. Oral hygiene is good. No erythema, swelling, or exudate. Tongue normal, non-obstructing. Tonsils not swollen or erythematous. Hearing normal.  Neck: Supple, thyroid not palpable. No bruits, nodes or JVD. Respiratory: Respiratory effort normal.  BS equal and clear bilateral without rales, rhonci, wheezing or stridor. Cardio: Heart sounds are normal with regular rate and rhythm and no murmurs, rubs or gallops. Peripheral pulses are normal and equal bilaterally without edema. No aortic or femoral bruits. Chest: symmetric with normal excursions and percussion.  Abdomen: Soft, with Nl bowel sounds. Nontender, no guarding, rebound, hernias, masses, or organomegaly.  Lymphatics: Non tender without lymphadenopathy.  Musculoskeletal: Full ROM all peripheral extremities, joint stability, 5/5 strength, and normal gait. Skin: Warm and dry without rashes, lesions, cyanosis, clubbing or  ecchymosis.  Neuro: Cranial nerves intact, reflexes equal bilaterally. Normal muscle tone, no cerebellar symptoms. Sensation intact.  Pysch: Alert and oriented X 3 with normal affect, insight and judgment appropriate.   Assessment and Plan  1. Annual Preventative/Screening Exam   2. Essential hypertension  - EKG 12-Lead - [place], RETROPERITNL ABD,  LTD - Urinalysis, Routine w reflex microscopic - Microalbumin / creatinine urine ratio - CBC with  Differential/Platelet - COMPLETE METABOLIC PANEL WITH GFR - Magnesium - TSH  3. Hyperlipidemia, mixed  - EKG 12-Lead - [place], RETROPERITNL ABD,  LTD - Lipid panel - TSH  4. Abnormal glucose  - EKG 12-Lead - [place], RETROPERITNL ABD,  LTD - Hemoglobin A1c - Insulin, random  5. Vitamin D deficiency  - VITAMIN D 25 Hydroxy   6. Prediabetes  - EKG 12-Lead - [place], RETROPERITNL ABD,  LTD - Hemoglobin A1c - Insulin, random  7. BPH with obstruction/lower  urinary tract symptoms  - PSA  8. Screening for prostate cancer  - PSA  9. History of colon cancer  - POC Hemoccult Bld/Stl   10. Screening for colorectal cancer  - POC Hemoccult Bld/Stl  11. Screening for ischemic heart disease  - EKG 12-Lead  12. FH: heart disease  - EKG 12-Lead - [place], RETROPERITNL ABD,  LTD  13. Aortic ectasia, thoracoabdominal (HCC)  - [place], RETROPERITNL ABD,  LTD  14. Screening for AAA (aortic abdominal aneurysm)  - [place], RETROPERITNL ABD,  LTD  15. Medication management  - Urinalysis, Routine w reflex microscopic - Microalbumin / creatinine urine ratio - CBC with Differential/Platelet - COMPLETE METABOLIC PANEL WITH GFR - Magnesium - Lipid panel - TSH - Hemoglobin A1c - Insulin, random - VITAMIN D 25 Hydroxyl         Patient was counseled in prudent diet, weight control to achieve/maintain BMI less than 25, BP monitoring, regular exercise and medications as discussed.  Discussed med effects and SE's. Routine screening labs and tests as requested with regular follow-up as recommended. Over 40 minutes of exam, counseling, chart review and high complex critical decision making was performed   Kirtland Bouchard, MD

## 2020-02-09 ENCOUNTER — Other Ambulatory Visit: Payer: Self-pay

## 2020-02-09 ENCOUNTER — Ambulatory Visit (INDEPENDENT_AMBULATORY_CARE_PROVIDER_SITE_OTHER): Payer: Medicare PPO | Admitting: Internal Medicine

## 2020-02-09 VITALS — BP 128/84 | HR 72 | Temp 96.9°F | Resp 16 | Ht 67.25 in | Wt 174.8 lb

## 2020-02-09 DIAGNOSIS — Z0001 Encounter for general adult medical examination with abnormal findings: Secondary | ICD-10-CM

## 2020-02-09 DIAGNOSIS — N401 Enlarged prostate with lower urinary tract symptoms: Secondary | ICD-10-CM

## 2020-02-09 DIAGNOSIS — E559 Vitamin D deficiency, unspecified: Secondary | ICD-10-CM

## 2020-02-09 DIAGNOSIS — Z125 Encounter for screening for malignant neoplasm of prostate: Secondary | ICD-10-CM | POA: Diagnosis not present

## 2020-02-09 DIAGNOSIS — R7303 Prediabetes: Secondary | ICD-10-CM | POA: Diagnosis not present

## 2020-02-09 DIAGNOSIS — Z0000 Encounter for general adult medical examination without abnormal findings: Secondary | ICD-10-CM

## 2020-02-09 DIAGNOSIS — Z79899 Other long term (current) drug therapy: Secondary | ICD-10-CM | POA: Diagnosis not present

## 2020-02-09 DIAGNOSIS — I77812 Thoracoabdominal aortic ectasia: Secondary | ICD-10-CM

## 2020-02-09 DIAGNOSIS — E782 Mixed hyperlipidemia: Secondary | ICD-10-CM

## 2020-02-09 DIAGNOSIS — Z8249 Family history of ischemic heart disease and other diseases of the circulatory system: Secondary | ICD-10-CM | POA: Diagnosis not present

## 2020-02-09 DIAGNOSIS — Z1211 Encounter for screening for malignant neoplasm of colon: Secondary | ICD-10-CM

## 2020-02-09 DIAGNOSIS — I10 Essential (primary) hypertension: Secondary | ICD-10-CM

## 2020-02-09 DIAGNOSIS — Z85038 Personal history of other malignant neoplasm of large intestine: Secondary | ICD-10-CM

## 2020-02-09 DIAGNOSIS — N138 Other obstructive and reflux uropathy: Secondary | ICD-10-CM | POA: Diagnosis not present

## 2020-02-09 DIAGNOSIS — Z136 Encounter for screening for cardiovascular disorders: Secondary | ICD-10-CM

## 2020-02-09 DIAGNOSIS — R7309 Other abnormal glucose: Secondary | ICD-10-CM

## 2020-02-10 LAB — CBC WITH DIFFERENTIAL/PLATELET
Absolute Monocytes: 536 cells/uL (ref 200–950)
Basophils Absolute: 102 cells/uL (ref 0–200)
Basophils Relative: 2 %
Eosinophils Absolute: 520 cells/uL — ABNORMAL HIGH (ref 15–500)
Eosinophils Relative: 10.2 %
HCT: 47 % (ref 38.5–50.0)
Hemoglobin: 14.9 g/dL (ref 13.2–17.1)
Lymphs Abs: 1454 cells/uL (ref 850–3900)
MCH: 29 pg (ref 27.0–33.0)
MCHC: 31.7 g/dL — ABNORMAL LOW (ref 32.0–36.0)
MCV: 91.4 fL (ref 80.0–100.0)
MPV: 10.7 fL (ref 7.5–12.5)
Monocytes Relative: 10.5 %
Neutro Abs: 2489 cells/uL (ref 1500–7800)
Neutrophils Relative %: 48.8 %
Platelets: 209 10*3/uL (ref 140–400)
RBC: 5.14 10*6/uL (ref 4.20–5.80)
RDW: 14.2 % (ref 11.0–15.0)
Total Lymphocyte: 28.5 %
WBC: 5.1 10*3/uL (ref 3.8–10.8)

## 2020-02-10 LAB — LIPID PANEL
Cholesterol: 166 mg/dL (ref <200)
HDL: 65 mg/dL (ref >=40)
LDL Cholesterol (Calc): 82 mg/dL (calc)
Non-HDL Cholesterol (Calc): 101 mg/dL (calc) (ref <130)
Total CHOL/HDL Ratio: 2.6 (calc) (ref <5.0)
Triglycerides: 96 mg/dL (ref <150)

## 2020-02-10 LAB — COMPLETE METABOLIC PANEL WITH GFR
AG Ratio: 1.2 (calc) (ref 1.0–2.5)
ALT: 17 U/L (ref 9–46)
AST: 20 U/L (ref 10–35)
Albumin: 4.1 g/dL (ref 3.6–5.1)
Alkaline phosphatase (APISO): 86 U/L (ref 35–144)
BUN: 18 mg/dL (ref 7–25)
CO2: 30 mmol/L (ref 20–32)
Calcium: 9.8 mg/dL (ref 8.6–10.3)
Chloride: 104 mmol/L (ref 98–110)
Creat: 1.06 mg/dL (ref 0.70–1.18)
GFR, Est African American: 78 mL/min/{1.73_m2} (ref >=60)
GFR, Est Non African American: 67 mL/min/{1.73_m2} (ref >=60)
Globulin: 3.4 g/dL (calc) (ref 1.9–3.7)
Glucose, Bld: 91 mg/dL (ref 65–99)
Potassium: 4 mmol/L (ref 3.5–5.3)
Sodium: 140 mmol/L (ref 135–146)
Total Bilirubin: 0.5 mg/dL (ref 0.2–1.2)
Total Protein: 7.5 g/dL (ref 6.1–8.1)

## 2020-02-10 LAB — MICROALBUMIN / CREATININE URINE RATIO
Creatinine, Urine: 30 mg/dL (ref 20–320)
Microalb Creat Ratio: 20 mcg/mg creat (ref <30)
Microalb, Ur: 0.6 mg/dL

## 2020-02-10 LAB — URINALYSIS, ROUTINE W REFLEX MICROSCOPIC
Bilirubin Urine: NEGATIVE
Glucose, UA: NEGATIVE
Hgb urine dipstick: NEGATIVE
Ketones, ur: NEGATIVE
Leukocytes,Ua: NEGATIVE
Nitrite: NEGATIVE
Protein, ur: NEGATIVE
Specific Gravity, Urine: 1.007 (ref 1.001–1.03)
pH: 6 (ref 5.0–8.0)

## 2020-02-10 LAB — PSA: PSA: 0.4 ng/mL (ref <=4.0)

## 2020-02-10 LAB — TSH: TSH: 0.95 mIU/L (ref 0.40–4.50)

## 2020-02-10 LAB — MAGNESIUM: Magnesium: 2.5 mg/dL (ref 1.5–2.5)

## 2020-02-10 LAB — HEMOGLOBIN A1C
Hgb A1c MFr Bld: 6 % of total Hgb — ABNORMAL HIGH (ref <5.7)
Mean Plasma Glucose: 126 (calc)
eAG (mmol/L): 7 (calc)

## 2020-02-10 LAB — VITAMIN D 25 HYDROXY (VIT D DEFICIENCY, FRACTURES): Vit D, 25-Hydroxy: 140 ng/mL — ABNORMAL HIGH (ref 30–100)

## 2020-02-10 LAB — INSULIN, RANDOM: Insulin: 6.8 u[IU]/mL

## 2020-03-04 ENCOUNTER — Other Ambulatory Visit: Payer: Self-pay | Admitting: Internal Medicine

## 2020-03-16 ENCOUNTER — Other Ambulatory Visit: Payer: Self-pay | Admitting: Adult Health

## 2020-05-15 NOTE — Progress Notes (Signed)
MEDICARE ANNUAL WELLNESS VISIT AND FOLLOW UP Assessment:    Encounter for Medicare annual wellness exam Tetanus scheduled at [place] hypertension - continue medications, DASH diet, exercise and monitor at home. Call if greater than 130/80.   Mixed hyperlipidemia continue crestor -continue medications, check lipids, decrease fatty foods, increase activity.   Prediabetes Discussed general issues about diabetes pathophysiology and management., Educational material distributed., Suggested low cholesterol diet., Encouraged aerobic exercise., Discussed foot care., Reminded to get yearly retinal exam. Check A1C q30m; monitor CMP  Vitamin D deficiency Above goal at recent check; has reduced dose per recommendations continue to recommend supplementation for goal of 60-100 Defer vitamin D level due to insurance/cost to next visit   Medication management  Uncomplicated asthma, unspecified asthma severity, unspecified whether persistent Controlled on inhalers/singulaire, avoid triggers   Gastroesophageal reflux disease, esophagitis presence not specified Continue PPI/H2 blocker, diet discussed  Benign prostatic hyperplasia with nocturia Continue to monitor PSA at CPE  History of colon cancer UTD on colonoscopy  Overweight Long discussion about weight loss, diet, and exercise Recommended diet heavy in fruits and veggies and low in animal meats, cheeses, and dairy products, appropriate calorie intake Discussed appropriate weight for height  Follow up at next visit  Osteoarthritis of bilateral knees Followed by Dr. Sharol Given getting injections, tylenol PRN, swimming helps    Future Appointments  Date Time Provider [place]  08/22/2020 10:30 AM Vicie Mutters, PA-C [facility] None  02/14/2021  9:00 AM Unk Pinto, MD [facility] None      Plan:   During the course of the visit the patient was educated and counseled about appropriate screening and preventive  services including:    Pneumococcal vaccine   Influenza vaccine  Td vaccine  Screening electrocardiogram  Colorectal cancer screening  Diabetes screening  Glaucoma screening   Subjective:  Jon [name] is a 79 y.o. male who presents for Medicare Annual Wellness Visit and 3 month follow up for HTN, hyperlipidemia, prediabetes, and vitamin D Def.   Still working part time but planning to fully retire this December.   He has a history of colon cancer in 2004, up to date on colonoscopy.  Asthma well controlled on inhalers and singulair   He is followed by Dr. Sharol Given for R knee pain and getting knee injections which are helpful, swimming has been helpful.   BMI is Body mass index is 26.49 kg/m., he has been working on diet and exercise, restarted swimming regularly and doing body weight exercises.  Wt Readings from Last 3 Encounters:  05/16/20 170 lb 6.4 oz (77.3 kg)  02/09/20 174 lb 12.8 oz (79.3 kg)  01/18/20 177 lb (80.3 kg)   His blood pressure has been controlled at home, today their BP is BP: 130/72  He does workout, no longer running due to knees, swimming 3 days a week.  He denies chest pain, shortness of breath, dizziness.  He is on cholesterol medication (crestor 40 mg) and denies myalgias. His cholesterol is at goal. The cholesterol last visit was:   Lab Results  Component Value Date   CHOL 166 02/09/2020   HDL 65 02/09/2020   LDLCALC 82 02/09/2020   TRIG 96 02/09/2020   CHOLHDL 2.6 02/09/2020   He has been working on diet and exercise for prediabetes but admits he likes bread and sweets, has cut down potatoes and , and denies paresthesia of the feet, polydipsia and polyuria. Last A1C in the office was:  Lab Results  Component Value Date  HGBA1C 6.0 (H) 02/09/2020    Last GFR:  Lab Results  Component Value Date   GFRNONAA 67 02/09/2020   Patient is on Vitamin D supplement and above goal:   Lab Results  Component Value Date   VD25OH 140 (H)  02/09/2020    Was reduced from 100000 weekly to 10000 IU daily   Medication Review: Current Outpatient Medications on File Prior to Visit  Medication Sig Dispense Refill  . aspirin EC 81 MG tablet Take 81 mg by mouth daily. Swallow whole.    . cetirizine (ZYRTEC) 10 MG tablet TAKE 1 TABLET DAILY FOR ALLERGIES 90 tablet 3  . Fluticasone-Salmeterol (ADVAIR DISKUS) 250-50 MCG/DOSE AEPB USE 1 INHALATION TWICE A DAY 180 each 4  . Ipratropium-Albuterol (COMBIVENT RESPIMAT) 20-100 MCG/ACT AERS respimat Inhale 1 puff into the lungs every 4 (four) hours as needed for wheezing. 4 g 4  . losartan (COZAAR) 50 MG tablet TAKE 1 TABLET DAILY FOR BLOOD PRESSURE 90 tablet 3  . montelukast (SINGULAIR) 10 MG tablet TAKE 1 TABLET DAILY FOR ALLERGY AND ASTHMA 90 tablet 3  . Multiple Vitamin (MULTIVITAMIN PO) Take by mouth daily.    . rosuvastatin (CRESTOR) 40 MG tablet Take 1 tablet Daily for Cholesterol 90 tablet 3  . verapamil (CALAN-SR) 240 MG CR tablet TAKE 1 TABLET DAILY WITH FOOD 90 tablet 3  . Vitamin D, Ergocalciferol, (DRISDOL) 1.25 MG (50000 UT) CAPS capsule TAKE 1 CAPSULE DAILY FOR SEVERE VITAMIN D DEFICIENCY (Patient taking differently: 70,000 Units. TAKE 1 CAPSULE DAILY FOR SEVERE VITAMIN D DEFICIENCY) 90 capsule 2   No current facility-administered medications on file prior to visit.    Current Problems (verified) Patient Active Problem List   Diagnosis Date Noted  . Overweight (BMI 25.0-29.9) 04/22/2018  . Osteoarthritis of knees, bilateral 06/24/2017  . Medication management 02/16/2014  . Hyperlipidemia   . Hypertension   . Vitamin D deficiency   . Prediabetes   . Asthma   . GERD (gastroesophageal reflux disease)   . History of colon cancer   . BPH (benign prostatic hyperplasia)     Screening Tests Immunization History  Administered Date(s) Administered  . DT (Pediatric) 07/10/2009  . Influenza Split 07/28/2013  . Influenza, High Dose Seasonal PF 09/18/2014, 07/26/2015,  08/18/2016, 07/13/2017, 07/28/2018, 08/03/2019  . PFIZER SARS-COV-2 Vaccination 12/17/2019, 01/08/2020  . Pneumococcal Conjugate-13 02/07/2016  . Pneumococcal Polysaccharide-23 02/22/2007   Preventative care: CT pelvis/AB 2007 Last colonoscopy: 2017, Dr. Earlean Shawl, repeat colonoscopy 5 years, due 2022 CXR 2009  Prior vaccinations: TD or Tdap: 2010, due 07/2019 - [facility] Influenza: 07/2019 Pneumococcal: 2008 Prevnar 13: 2017  Shingles/Zostavax: declines Covid 19: 2/2, 2021, pfizer   Names of Other Physician/Practitioners you currently use: 1. [facility] here for primary care 2. [employer], dentist, next appointment in 2021, has scheduled next week  3.  Dr. Bing Plume, vision, 2019, has upcoming appointment   Patient Care Team: Unk Pinto, MD as PCP - General (Internal Medicine) Calvert Cantor, MD as Consulting Physician (Ophthalmology) Richmond Campbell, MD as Consulting Physician (Gastroenterology) Ladell Pier, MD as Consulting Physician (Oncology)  History reviewed: allergies, current medications, past family history, past medical history, past social history, past surgical history and problem list  Allergies Allergies  Allergen Reactions  . Mavik [Trandolapril]     Cough  . Hctz [Hydrochlorothiazide] Rash    Photosensitivity    SURGICAL HISTORY He  has a past surgical history that includes Colon surgery (2004). FAMILY HISTORY  His family history includes Autoimmune disease in his brother; Cancer in his father and sister; Heart disease in his mother. SOCIAL HISTORY He  reports that he has never smoked. He has never used smokeless tobacco. He reports that he does not drink alcohol and does not use drugs.   MEDICARE WELLNESS OBJECTIVES: Physical activity: Current Exercise Habits: Home exercise routine, Type of exercise: strength training/weights;Other - see comments;stretching (swimming), Time (Minutes): 45,  Frequency (Times/Week): 7, Weekly Exercise (Minutes/Week): 315, Intensity: Mild, Exercise limited by: None identified Cardiac risk factors: Cardiac Risk Factors include: advanced age (>31men, >74 women);dyslipidemia;hypertension;male gender Depression/mood screen:   Depression screen [facility] 2/9 05/16/2020  Decreased Interest 0  Down, Depressed, Hopeless 0  PHQ - 2 Score 0    ADLs:  In your present state of health, do you have any difficulty performing the following activities: 05/16/2020 02/08/2020  Hearing? N N  Vision? N N  Difficulty concentrating or making decisions? N N  Walking or climbing stairs? N N  Dressing or bathing? N N  Doing errands, shopping? N N  Some recent data might be hidden     Cognitive Testing  Alert? Yes  Normal Appearance?Yes  Oriented to person? Yes  Place? Yes   Time? Yes  Recall of three objects?  Yes  Can perform simple calculations? Yes  Displays appropriate judgment?Yes  Can read the correct time from a watch face?Yes  EOL planning: Does Patient Have a Medical Advance Directive?: Yes Type of Advance Directive: Healthcare Power of Attorney, Living will Does patient want to make changes to medical advance directive?: No - Patient declined Copy of [place] in Chart?: No - copy requested   Objective:   Blood pressure 130/72, pulse 75, temperature 97.7 F (36.5 C), weight 170 lb 6.4 oz (77.3 kg), SpO2 96 %. Body mass index is 26.49 kg/m.  General appearance: alert, no distress, WD/WN, male HEENT: normocephalic, sclerae anicteric, TMs pearly, nares patent, no discharge or erythema, pharynx normal Oral cavity: MMM, no lesions Neck: supple, no lymphadenopathy, no thyromegaly, no masses Heart: RRR, normal S1, S2, no murmurs Lungs: CTA bilaterally, no wheezes, rhonchi, or rales Abdomen: +bs, soft, non tender, non distended, no masses, no hepatomegaly, no splenomegaly Musculoskeletal: nontender, no swelling, no obvious deformity.   Extremities: no edema, no cyanosis, no clubbing Pulses: 2+ symmetric, upper and lower extremities, normal cap refill Neurological: alert, oriented x 3, CN2-12 intact, strength normal upper extremities and lower extremities, sensation normal throughout, DTRs 2+ throughout, no cerebellar signs, gait normal Psychiatric: normal affect, behavior normal, pleasant   Medicare Attestation I have personally reviewed: The patient's medical and social history Their use of alcohol, tobacco or illicit drugs Their current medications and supplements The patient's functional ability including ADLs,fall risks, home safety risks, cognitive, and hearing and visual impairment Diet and physical activities Evidence for depression or mood disorders  The patient's weight, height, BMI, and visual acuity have been recorded in the chart.  I have made referrals, counseling, and provided education to the patient based on review of the above and I have provided the patient with a written personalized care plan for preventive services.     Izora Ribas, NP   05/16/2020

## 2020-05-16 ENCOUNTER — Ambulatory Visit (INDEPENDENT_AMBULATORY_CARE_PROVIDER_SITE_OTHER): Payer: Medicare PPO | Admitting: Adult Health

## 2020-05-16 ENCOUNTER — Encounter: Payer: Self-pay | Admitting: Adult Health

## 2020-05-16 ENCOUNTER — Other Ambulatory Visit: Payer: Self-pay

## 2020-05-16 VITALS — BP 130/72 | HR 75 | Temp 97.7°F | Wt 170.4 lb

## 2020-05-16 DIAGNOSIS — E663 Overweight: Secondary | ICD-10-CM | POA: Diagnosis not present

## 2020-05-16 DIAGNOSIS — Z0001 Encounter for general adult medical examination with abnormal findings: Secondary | ICD-10-CM | POA: Diagnosis not present

## 2020-05-16 DIAGNOSIS — K219 Gastro-esophageal reflux disease without esophagitis: Secondary | ICD-10-CM

## 2020-05-16 DIAGNOSIS — M170 Bilateral primary osteoarthritis of knee: Secondary | ICD-10-CM

## 2020-05-16 DIAGNOSIS — Z0000 Encounter for general adult medical examination without abnormal findings: Secondary | ICD-10-CM

## 2020-05-16 DIAGNOSIS — E782 Mixed hyperlipidemia: Secondary | ICD-10-CM | POA: Diagnosis not present

## 2020-05-16 DIAGNOSIS — Z85038 Personal history of other malignant neoplasm of large intestine: Secondary | ICD-10-CM

## 2020-05-16 DIAGNOSIS — Z79899 Other long term (current) drug therapy: Secondary | ICD-10-CM | POA: Diagnosis not present

## 2020-05-16 DIAGNOSIS — J4530 Mild persistent asthma, uncomplicated: Secondary | ICD-10-CM | POA: Diagnosis not present

## 2020-05-16 DIAGNOSIS — R6889 Other general symptoms and signs: Secondary | ICD-10-CM | POA: Diagnosis not present

## 2020-05-16 DIAGNOSIS — R7303 Prediabetes: Secondary | ICD-10-CM | POA: Diagnosis not present

## 2020-05-16 DIAGNOSIS — I10 Essential (primary) hypertension: Secondary | ICD-10-CM | POA: Diagnosis not present

## 2020-05-16 DIAGNOSIS — E559 Vitamin D deficiency, unspecified: Secondary | ICD-10-CM | POA: Diagnosis not present

## 2020-05-16 NOTE — Patient Instructions (Signed)
Jon [name] , Thank you for taking time to come for your Medicare Wellness Visit. I appreciate your ongoing commitment to your health goals. Please review the following plan we discussed and let me know if I can assist you in the future.   These are the goals we discussed: Goals    . DIET - INCREASE WATER INTAKE     5-6 bottles daily     . HEMOGLOBIN A1C < 5.7       This is a list of the screening recommended for you and due dates:  Health Maintenance  Topic Date Due  . Tetanus Vaccine  07/04/2020*  .  Hepatitis C: One time screening is recommended by Center for Disease Control  (CDC) for  adults born from [birth year] through 1965.   05/16/2021*  . Flu Shot  06/03/2020  . COVID-19 Vaccine  Completed  . Pneumonia vaccines  Completed  *Topic was postponed. The date shown is not the original due date.      For ear wax:  Use a dropper or use a cap to put peroxide, olive oil,mineral oil or canola oil in the effected ear- 2-3 times a week. Let it soak for 20-30 min then you can take a shower or use a baby bulb with warm water to wash out the ear wax.  Can buy debrox wax removal kit over the counter.  Do not use Qtips  High-Fiber Diet Fiber, also called dietary fiber, is a type of carbohydrate that is found in fruits, vegetables, whole grains, and beans. A high-fiber diet can have many health benefits. Your health care provider may recommend a high-fiber diet to help:  Prevent constipation. Fiber can make your bowel movements more regular.  Lower your cholesterol.  Relieve the following conditions: ? Swelling of veins in the anus (hemorrhoids). ? Swelling and irritation (inflammation) of specific areas of the digestive tract (uncomplicated diverticulosis). ? A problem of the large intestine (colon) that sometimes causes pain and diarrhea (irritable bowel syndrome, IBS).  Prevent overeating as part of a weight-loss plan.  Prevent heart disease, type 2 diabetes, and certain  cancers. What is my plan? The recommended daily fiber intake in grams (g) includes:  38 g for men age 3 or younger.  30 g for men over age 70.  43 g for women age 95 or younger.  21 g for women over age 59. You can get the recommended daily intake of dietary fiber by:  Eating a variety of fruits, vegetables, grains, and beans.  Taking a fiber supplement, if it is not possible to get enough fiber through your diet. What do I need to know about a high-fiber diet?  It is better to get fiber through food sources rather than from fiber supplements. There is not a lot of research about how effective supplements are.  Always check the fiber content on the nutrition facts label of any prepackaged food. Look for foods that contain 5 g of fiber or more per serving.  Talk with a diet and nutrition specialist (dietitian) if you have questions about specific foods that are recommended or not recommended for your medical condition, especially if those foods are not listed below.  Gradually increase how much fiber you consume. If you increase your intake of dietary fiber too quickly, you may have bloating, cramping, or gas.  Drink plenty of water. Water helps you to digest fiber. What are tips for following this plan?  Eat a wide variety of high-fiber foods.  Make sure that half of the grains that you eat each day are whole grains.  Eat breads and cereals that are made with whole-grain flour instead of refined flour or white flour.  Eat brown rice, bulgur wheat, or millet instead of white rice.  Start the day with a breakfast that is high in fiber, such as a cereal that contains 5 g of fiber or more per serving.  Use beans in place of meat in soups, salads, and pasta dishes.  Eat high-fiber snacks, such as berries, raw vegetables, nuts, and popcorn.  Choose whole fruits and vegetables instead of processed forms like juice or sauce. What foods can I eat?  Fruits Berries. Pears.  Apples. Oranges. Avocado. Prunes and raisins. Dried figs. Vegetables Sweet potatoes. Spinach. Kale. Artichokes. Cabbage. Broccoli. Cauliflower. Green peas. Carrots. Squash. Grains Whole-grain breads. Multigrain cereal. Oats and oatmeal. Brown rice. Barley. Bulgur wheat. [place]. Quinoa. Bran muffins. Popcorn. Rye wafer crackers. Meats and other proteins Navy, kidney, and pinto beans. Soybeans. Split peas. Lentils. Nuts and seeds. Dairy Fiber-fortified yogurt. Beverages Fiber-fortified soy milk. Fiber-fortified orange juice. Other foods Fiber bars. The items listed above may not be a complete list of recommended foods and beverages. Contact a dietitian for more options. What foods are not recommended? Fruits Fruit juice. Cooked, strained fruit. Vegetables Fried potatoes. Canned vegetables. Well-cooked vegetables. Grains White bread. Pasta made with refined flour. White rice. Meats and other proteins Fatty cuts of meat. Fried chicken or fried fish. Dairy Milk. Yogurt. Cream cheese. Sour cream. Fats and oils Butters. Beverages Soft drinks. Other foods Cakes and pastries. The items listed above may not be a complete list of foods and beverages to avoid. Contact a dietitian for more information. Summary  Fiber is a type of carbohydrate. It is found in fruits, vegetables, whole grains, and beans.  There are many health benefits of eating a high-fiber diet, such as preventing constipation, lowering blood cholesterol, helping with weight loss, and reducing your risk of heart disease, diabetes, and certain cancers.  Gradually increase your intake of fiber. Increasing too fast can result in cramping, bloating, and gas. Drink plenty of water while you increase your fiber.  The best sources of fiber include whole fruits and vegetables, whole grains, nuts, seeds, and beans. This information is not intended to replace advice given to you by your health care provider. Make sure you discuss  any questions you have with your health care provider. Document Revised: 08/24/2017 Document Reviewed: 08/24/2017 Elsevier Patient Education  2020 [employer].

## 2020-05-17 LAB — CBC WITH DIFFERENTIAL/PLATELET
Absolute Monocytes: 688 cells/uL (ref 200–950)
Basophils Absolute: 99 cells/uL (ref 0–200)
Basophils Relative: 1.6 %
Eosinophils Absolute: 639 cells/uL — ABNORMAL HIGH (ref 15–500)
Eosinophils Relative: 10.3 %
HCT: 44.4 % (ref 38.5–50.0)
Hemoglobin: 14.8 g/dL (ref 13.2–17.1)
Lymphs Abs: 2263 cells/uL (ref 850–3900)
MCH: 30.1 pg (ref 27.0–33.0)
MCHC: 33.3 g/dL (ref 32.0–36.0)
MCV: 90.4 fL (ref 80.0–100.0)
MPV: 10.4 fL (ref 7.5–12.5)
Monocytes Relative: 11.1 %
Neutro Abs: 2511 cells/uL (ref 1500–7800)
Neutrophils Relative %: 40.5 %
Platelets: 223 10*3/uL (ref 140–400)
RBC: 4.91 10*6/uL (ref 4.20–5.80)
RDW: 13.7 % (ref 11.0–15.0)
Total Lymphocyte: 36.5 %
WBC: 6.2 10*3/uL (ref 3.8–10.8)

## 2020-05-17 LAB — COMPLETE METABOLIC PANEL WITH GFR
AG Ratio: 1.4 (calc) (ref 1.0–2.5)
ALT: 25 U/L (ref 9–46)
AST: 25 U/L (ref 10–35)
Albumin: 4.2 g/dL (ref 3.6–5.1)
Alkaline phosphatase (APISO): 114 U/L (ref 35–144)
BUN: 21 mg/dL (ref 7–25)
CO2: 28 mmol/L (ref 20–32)
Calcium: 9.3 mg/dL (ref 8.6–10.3)
Chloride: 105 mmol/L (ref 98–110)
Creat: 1.06 mg/dL (ref 0.70–1.18)
GFR, Est African American: 77 mL/min/{1.73_m2} (ref >=60)
GFR, Est Non African American: 66 mL/min/{1.73_m2} (ref >=60)
Globulin: 3 g/dL (calc) (ref 1.9–3.7)
Glucose, Bld: 60 mg/dL — ABNORMAL LOW (ref 65–99)
Potassium: 4 mmol/L (ref 3.5–5.3)
Sodium: 141 mmol/L (ref 135–146)
Total Bilirubin: 0.4 mg/dL (ref 0.2–1.2)
Total Protein: 7.2 g/dL (ref 6.1–8.1)

## 2020-05-17 LAB — LIPID PANEL
Cholesterol: 164 mg/dL (ref <200)
HDL: 61 mg/dL (ref >=40)
LDL Cholesterol (Calc): 83 mg/dL (calc)
Non-HDL Cholesterol (Calc): 103 mg/dL (calc) (ref <130)
Total CHOL/HDL Ratio: 2.7 (calc) (ref <5.0)
Triglycerides: 107 mg/dL (ref <150)

## 2020-05-17 LAB — MAGNESIUM: Magnesium: 2.4 mg/dL (ref 1.5–2.5)

## 2020-05-17 LAB — TSH: TSH: 1.68 mIU/L (ref 0.40–4.50)

## 2020-05-30 ENCOUNTER — Other Ambulatory Visit: Payer: Self-pay | Admitting: Internal Medicine

## 2020-06-04 ENCOUNTER — Other Ambulatory Visit: Payer: Self-pay

## 2020-06-04 DIAGNOSIS — J4530 Mild persistent asthma, uncomplicated: Secondary | ICD-10-CM

## 2020-06-04 MED ORDER — COMBIVENT RESPIMAT 20-100 MCG/ACT IN AERS: 1.0000 | INHALATION_SPRAY | RESPIRATORY_TRACT | 4 refills | Status: DC | PRN

## 2020-06-28 ENCOUNTER — Other Ambulatory Visit: Payer: Self-pay | Admitting: Adult Health

## 2020-06-28 DIAGNOSIS — E782 Mixed hyperlipidemia: Secondary | ICD-10-CM

## 2020-07-06 DIAGNOSIS — H52223 Regular astigmatism, bilateral: Secondary | ICD-10-CM | POA: Diagnosis not present

## 2020-07-06 DIAGNOSIS — H43813 Vitreous degeneration, bilateral: Secondary | ICD-10-CM | POA: Diagnosis not present

## 2020-07-06 DIAGNOSIS — H2513 Age-related nuclear cataract, bilateral: Secondary | ICD-10-CM | POA: Diagnosis not present

## 2020-07-06 DIAGNOSIS — H524 Presbyopia: Secondary | ICD-10-CM | POA: Diagnosis not present

## 2020-07-06 DIAGNOSIS — H353111 Nonexudative age-related macular degeneration, right eye, early dry stage: Secondary | ICD-10-CM | POA: Diagnosis not present

## 2020-07-06 DIAGNOSIS — H04123 Dry eye syndrome of bilateral lacrimal glands: Secondary | ICD-10-CM | POA: Diagnosis not present

## 2020-08-22 ENCOUNTER — Other Ambulatory Visit: Payer: Self-pay

## 2020-08-22 ENCOUNTER — Encounter: Payer: Self-pay | Admitting: Adult Health Nurse Practitioner

## 2020-08-22 ENCOUNTER — Ambulatory Visit (INDEPENDENT_AMBULATORY_CARE_PROVIDER_SITE_OTHER): Payer: Medicare PPO | Admitting: Adult Health Nurse Practitioner

## 2020-08-22 VITALS — BP 130/88 | HR 78 | Temp 97.3°F | Wt 174.0 lb

## 2020-08-22 DIAGNOSIS — E663 Overweight: Secondary | ICD-10-CM

## 2020-08-22 DIAGNOSIS — I10 Essential (primary) hypertension: Secondary | ICD-10-CM

## 2020-08-22 DIAGNOSIS — E559 Vitamin D deficiency, unspecified: Secondary | ICD-10-CM

## 2020-08-22 DIAGNOSIS — R7303 Prediabetes: Secondary | ICD-10-CM

## 2020-08-22 DIAGNOSIS — E782 Mixed hyperlipidemia: Secondary | ICD-10-CM | POA: Diagnosis not present

## 2020-08-22 DIAGNOSIS — M170 Bilateral primary osteoarthritis of knee: Secondary | ICD-10-CM

## 2020-08-22 DIAGNOSIS — Z79899 Other long term (current) drug therapy: Secondary | ICD-10-CM

## 2020-08-22 DIAGNOSIS — J4530 Mild persistent asthma, uncomplicated: Secondary | ICD-10-CM

## 2020-08-22 DIAGNOSIS — K219 Gastro-esophageal reflux disease without esophagitis: Secondary | ICD-10-CM

## 2020-08-22 NOTE — Progress Notes (Signed)
FOLLOW UP 6 MONTH  Assessment / Plan:    Essential hypertension Continue current medications: losartan 50mg , verapamil 240mg  Monitor blood pressure at home; call if consistently over 130/80 Continue DASH diet.   Reminder to go to the ER if any CP, SOB, nausea, dizziness, severe HA, changes vision/speech, left arm numbness and tingling and jaw pain.  Mixed hyperlipidemia Continue: rosuvastatin 89mng daily -continue medications, check lipids, decrease fatty foods, increase activity.   Prediabetes Discussed general issues about diabetes pathophysiology and management., Educational material distributed., Suggested low cholesterol diet., Encouraged aerobic exercise., Discussed foot care., Reminded to get yearly retinal exam. Check A1C q26m; monitor CMP  Vitamin D deficiency Above goal at recent check; has reduced dose per recommendations continue to recommend supplementation for goal of 60-100 Defer vitamin D level due to insurance/cost to next visit   Medication management Continued  Uncomplicated asthma, unspecified asthma severity, unspecified whether persistent Controlled on advair daily and has combivent 20-100 Q4hours PRN /singulair Avoid triggers  Gastroesophageal reflux disease, esophagitis presence not specified Continue PPI/H2 blocker, diet discussed  Benign prostatic hyperplasia with nocturia Continue to monitor PSA at CPE  History of colon cancer UTD on colonoscopy  Overweight Long discussion about weight loss, diet, and exercise Recommended diet heavy in fruits and veggies and low in animal meats, cheeses, and dairy products, appropriate calorie intake Discussed appropriate weight for height  Follow up at next visit  Osteoarthritis of bilateral knees Followed by Dr. Sharol Given getting injections, tylenol PRN, swimming helps    Future Appointments  Date Time Provider [place]  02/14/2021  9:00 AM Unk Pinto, MD [facility] None  05/22/2021  3:30 PM  Jon Sierras, NP [facility] None       Subjective:  Jon [name] is a 79 y.o. male who presents for 3 month follow up for HTN,HLD, prediabetes, and vitamin D Def.   He has been walking 2 miles a day.  He reports his right knee is doing much better and his right upper leg and back have improved.  Still working part time but planning to fully retire this December.    He has a history of colon cancer in 2004, up to date on colonoscopy.   Asthma well controlled on inhalers and singulair.  No increase in rescue inhaler use.  He is followed by Dr. Sharol Given for R knee pain and getting knee injections which are helpful, swimming has been helpful. He has not done this since COVID but he has been increasing walking as noted above.Marland Kitchen    BMI is Body mass index is 27.05 kg/m., he has been working on diet and exercise, restarted swimming regularly and doing body weight exercises.  Wt Readings from Last 3 Encounters:  08/22/20 174 lb (78.9 kg)  05/16/20 170 lb 6.4 oz (77.3 kg)  02/09/20 174 lb 12.8 oz (79.3 kg)   His blood pressure has been controlled at home, today their BP is BP: 130/88  He does workout, no longer running due to knees, swimming 3 days a week.  He denies chest pain, shortness of breath, dizziness.  He is on cholesterol medication (crestor 40 mg) and denies myalgias. His cholesterol is at goal. The cholesterol last visit was:   Lab Results  Component Value Date   CHOL 164 05/16/2020   HDL 61 05/16/2020   LDLCALC 83 05/16/2020   TRIG 107 05/16/2020   CHOLHDL 2.7 05/16/2020   He has been working on diet and exercise for prediabetes but admits he likes bread and sweets,  has cut down potatoes and , and denies paresthesia of the feet, polydipsia and polyuria. Last A1C in the office was:  Lab Results  Component Value Date   HGBA1C 6.0 (H) 02/09/2020    Last GFR:  Lab Results  Component Value Date   [facility] 66 05/16/2020   Patient is on Vitamin D supplement and above  goal:   Lab Results  Component Value Date   VD25OH 140 (H) 02/09/2020    Was reduced from 100000 weekly to 10000 IU daily   Medication Review: Current Outpatient Medications on File Prior to Visit  Medication Sig Dispense Refill  . aspirin EC 81 MG tablet Take 81 mg by mouth daily. Swallow whole.    . cetirizine (ZYRTEC) 10 MG tablet TAKE 1 TABLET DAILY FOR ALLERGIES 90 tablet 3  . Fluticasone-Salmeterol (ADVAIR DISKUS) 250-50 MCG/DOSE AEPB USE 1 INHALATION TWICE A DAY 180 each 4  . Ipratropium-Albuterol (COMBIVENT RESPIMAT) 20-100 MCG/ACT AERS respimat Inhale 1 puff into the lungs every 4 (four) hours as needed for wheezing. 4 g 4  . losartan (COZAAR) 50 MG tablet TAKE 1 TABLET DAILY FOR BLOOD PRESSURE 90 tablet 3  . montelukast (SINGULAIR) 10 MG tablet TAKE 1 TABLET DAILY FOR ALLERGY AND ASTHMA 90 tablet 3  . Multiple Vitamin (MULTIVITAMIN PO) Take by mouth daily.    . rosuvastatin (CRESTOR) 40 MG tablet TAKE 1 TABLET DAILY FOR CHOLESTEROL 90 tablet 3  . verapamil (CALAN-SR) 240 MG CR tablet TAKE 1 TABLET DAILY WITH FOOD 90 tablet 3  . Vitamin D, Ergocalciferol, (DRISDOL) 1.25 MG (50000 UT) CAPS capsule TAKE 1 CAPSULE DAILY FOR SEVERE VITAMIN D DEFICIENCY (Patient taking differently: 70,000 Units. TAKE 1 CAPSULE DAILY FOR SEVERE VITAMIN D DEFICIENCY) 90 capsule 2   No current facility-administered medications on file prior to visit.    Current Problems (verified) Patient Active Problem List   Diagnosis Date Noted  . Overweight (BMI 25.0-29.9) 04/22/2018  . Osteoarthritis of knees, bilateral 06/24/2017  . Medication management 02/16/2014  . Hyperlipidemia   . Hypertension   . Vitamin D deficiency   . Prediabetes   . Asthma   . GERD (gastroesophageal reflux disease)   . History of colon cancer   . BPH (benign prostatic hyperplasia)     Screening Tests Immunization History  Administered Date(s) Administered  . DT (Pediatric) 07/10/2009  . Influenza Split 07/28/2013  .  Influenza, High Dose Seasonal PF 09/18/2014, 07/26/2015, 08/18/2016, 07/13/2017, 07/28/2018, 08/03/2019  . PFIZER SARS-COV-2 Vaccination 12/17/2019, 01/08/2020  . Pneumococcal Conjugate-13 02/07/2016  . Pneumococcal Polysaccharide-23 02/22/2007   Preventative care: CT pelvis/AB 2007 Last colonoscopy: 2017, Dr. Earlean Shawl, repeat colonoscopy 5 years, due 2022 CXR 2009  Prior vaccinations: TD or Tdap: 2010, due 08/2020 - [facility] Influenza: 07/2019 Pneumococcal: 2008 Prevnar 13: 2017  Shingles/Zostavax: declines Covid 19: 2/2, 2021, pfizer    Names of Other Physician/Practitioners you currently use: 1. [facility] here for primary care 2. [employer], dentist, next appointment in 2021, has scheduled next week  3.  Dr. Bing Plume, vision, 2019, has upcoming appointment   Patient Care Team: Unk Pinto, MD as PCP - General (Internal Medicine) Calvert Cantor, MD as Consulting Physician (Ophthalmology) Richmond Campbell, MD as Consulting Physician (Gastroenterology) Ladell Pier, MD as Consulting Physician (Oncology)  History reviewed: allergies, current medications, past family history, past medical history, past social history, past surgical history and problem list  Allergies Allergies  Allergen Reactions  . Mavik [Trandolapril]  Cough  . Hctz [Hydrochlorothiazide] Rash    Photosensitivity    SURGICAL HISTORY He  has a past surgical history that includes Colon surgery (2004). FAMILY HISTORY His family history includes Autoimmune disease in his brother; Cancer in his father and sister; Heart disease in his mother. SOCIAL HISTORY He  reports that he has never smoked. He has never used smokeless tobacco. He reports that he does not drink alcohol and does not use drugs.     Objective:   Blood pressure 130/88, pulse 78, temperature (!) 97.3 F (36.3 C), weight 174 lb (78.9 kg), SpO2 100 %. Body mass index is 27.05  kg/m.  General appearance: alert, no distress, WD/WN, male HEENT: normocephalic, sclerae anicteric, TMs pearly, nares patent, no discharge or erythema, pharynx normal Oral cavity: MMM, no lesions Neck: supple, no lymphadenopathy, no thyromegaly, no masses Heart: RRR, normal S1, S2, no murmurs Lungs: CTA bilaterally, no wheezes, rhonchi, or rales Abdomen: +bs, soft, non tender, non distended, no masses, no hepatomegaly, no splenomegaly Musculoskeletal: nontender, no swelling, no obvious deformity.  Extremities: no edema, no cyanosis, no clubbing Pulses: 2+ symmetric, upper and lower extremities, normal cap refill Neurological: alert, oriented x 3, CN2-12 intact, strength normal upper extremities and lower extremities, sensation normal throughout, DTRs 2+ throughout, no cerebellar signs, gait normal Psychiatric: normal affect, behavior normal, pleasant    Jon [name], Laqueta Jean, DNP [facility] 08/22/2020  11:13 AM

## 2020-08-23 LAB — CBC WITH DIFFERENTIAL/PLATELET
Absolute Monocytes: 528 cells/uL (ref 200–950)
Basophils Absolute: 120 cells/uL (ref 0–200)
Basophils Relative: 2.5 %
Eosinophils Absolute: 475 cells/uL (ref 15–500)
Eosinophils Relative: 9.9 %
HCT: 44.5 % (ref 38.5–50.0)
Hemoglobin: 14.6 g/dL (ref 13.2–17.1)
Lymphs Abs: 1608 cells/uL (ref 850–3900)
MCH: 29.9 pg (ref 27.0–33.0)
MCHC: 32.8 g/dL (ref 32.0–36.0)
MCV: 91 fL (ref 80.0–100.0)
MPV: 10.8 fL (ref 7.5–12.5)
Monocytes Relative: 11 %
Neutro Abs: 2069 cells/uL (ref 1500–7800)
Neutrophils Relative %: 43.1 %
Platelets: 215 10*3/uL (ref 140–400)
RBC: 4.89 10*6/uL (ref 4.20–5.80)
RDW: 13.8 % (ref 11.0–15.0)
Total Lymphocyte: 33.5 %
WBC: 4.8 10*3/uL (ref 3.8–10.8)

## 2020-08-23 LAB — HEMOGLOBIN A1C
Hgb A1c MFr Bld: 5.8 % of total Hgb — ABNORMAL HIGH (ref <5.7)
Mean Plasma Glucose: 120 (calc)
eAG (mmol/L): 6.6 (calc)

## 2020-08-23 LAB — LIPID PANEL
Cholesterol: 291 mg/dL — ABNORMAL HIGH (ref <200)
HDL: 60 mg/dL (ref >=40)
LDL Cholesterol (Calc): 199 mg/dL (calc) — ABNORMAL HIGH
Non-HDL Cholesterol (Calc): 231 mg/dL (calc) — ABNORMAL HIGH (ref <130)
Total CHOL/HDL Ratio: 4.9 (calc) (ref <5.0)
Triglycerides: 156 mg/dL — ABNORMAL HIGH (ref <150)

## 2020-08-23 LAB — COMPLETE METABOLIC PANEL WITH GFR
AG Ratio: 1.3 (calc) (ref 1.0–2.5)
ALT: 20 U/L (ref 9–46)
AST: 22 U/L (ref 10–35)
Albumin: 4 g/dL (ref 3.6–5.1)
Alkaline phosphatase (APISO): 82 U/L (ref 35–144)
BUN: 17 mg/dL (ref 7–25)
CO2: 30 mmol/L (ref 20–32)
Calcium: 9.5 mg/dL (ref 8.6–10.3)
Chloride: 104 mmol/L (ref 98–110)
Creat: 1.11 mg/dL (ref 0.70–1.18)
GFR, Est African American: 73 mL/min/{1.73_m2} (ref >=60)
GFR, Est Non African American: 63 mL/min/{1.73_m2} (ref >=60)
Globulin: 3.1 g/dL (calc) (ref 1.9–3.7)
Glucose, Bld: 87 mg/dL (ref 65–99)
Potassium: 4.5 mmol/L (ref 3.5–5.3)
Sodium: 140 mmol/L (ref 135–146)
Total Bilirubin: 0.7 mg/dL (ref 0.2–1.2)
Total Protein: 7.1 g/dL (ref 6.1–8.1)

## 2020-09-02 ENCOUNTER — Encounter: Payer: Self-pay | Admitting: Adult Health Nurse Practitioner

## 2020-10-08 ENCOUNTER — Encounter: Payer: Self-pay | Admitting: Internal Medicine

## 2020-10-08 ENCOUNTER — Other Ambulatory Visit: Payer: Self-pay

## 2020-10-08 ENCOUNTER — Ambulatory Visit (INDEPENDENT_AMBULATORY_CARE_PROVIDER_SITE_OTHER): Payer: Medicare PPO | Admitting: Internal Medicine

## 2020-10-08 VITALS — BP 134/84 | HR 75 | Temp 97.0°F | Resp 16 | Ht 67.25 in | Wt 174.4 lb

## 2020-10-08 DIAGNOSIS — J0410 Acute tracheitis without obstruction: Secondary | ICD-10-CM

## 2020-10-08 DIAGNOSIS — Z23 Encounter for immunization: Secondary | ICD-10-CM

## 2020-10-08 MED ORDER — AZITHROMYCIN 250 MG PO TABS: ORAL_TABLET | ORAL | 1 refills | Status: DC

## 2020-10-08 MED ORDER — DEXAMETHASONE 4 MG PO TABS: ORAL_TABLET | ORAL | 0 refills | Status: DC

## 2020-10-08 NOTE — Progress Notes (Signed)
History of Present Illness:     Patient is a 79 yo Married [place] man, non smoker , with hx/orecurremnt Asthmatic Bronchitis in years past who presents with c/o sinus congestion /drainage & cough for 2 weeks . Denies fever, chills, rash, sputum or dyspnea.   Medications  Current Outpatient Medications (Endocrine & Metabolic):  .  dexamethasone (DECADRON) 4 MG tablet, Take 1 tab 3 x day - 3 days, then 2 x day - 3 days, then 1 tab daily  Current Outpatient Medications (Cardiovascular):  .  losartan (COZAAR) 50 MG tablet, TAKE 1 TABLET DAILY FOR BLOOD PRESSURE .  rosuvastatin (CRESTOR) 40 MG tablet, TAKE 1 TABLET DAILY FOR CHOLESTEROL .  verapamil (CALAN-SR) 240 MG CR tablet, TAKE 1 TABLET DAILY WITH FOOD  Current Outpatient Medications (Respiratory):  .  cetirizine (ZYRTEC) 10 MG tablet, TAKE 1 TABLET DAILY FOR ALLERGIES .  Fluticasone-Salmeterol (ADVAIR DISKUS) 250-50 MCG/DOSE AEPB, USE 1 INHALATION TWICE A DAY .  Ipratropium-Albuterol (COMBIVENT RESPIMAT) 20-100 MCG/ACT AERS respimat, Inhale 1 puff into the lungs every 4 (four) hours as needed for wheezing. .  montelukast (SINGULAIR) 10 MG tablet, TAKE 1 TABLET DAILY FOR ALLERGY AND ASTHMA  Current Outpatient Medications (Analgesics):  .  aspirin EC 81 MG tablet, Take 81 mg by mouth daily. Swallow whole.   Current Outpatient Medications (Other):  Marland Kitchen  Multiple Vitamin (MULTIVITAMIN PO), Take by mouth daily. .  Vitamin D, Ergocalciferol, (DRISDOL) 1.25 MG (50000 UT) CAPS capsule, TAKE 1 CAPSULE DAILY FOR SEVERE VITAMIN D DEFICIENCY (Patient taking differently: 70,000 Units. TAKE 1 CAPSULE DAILY FOR SEVERE VITAMIN D DEFICIENCY) .  azithromycin (ZITHROMAX) 250 MG tablet, Take 2 tablets with Food on  Day 1, then 1 tablet Daily with Food for Infection  Problem list He has Hyperlipidemia; Hypertension; Vitamin D deficiency; Prediabetes; Asthma; GERD (gastroesophageal reflux disease); History of colon cancer; BPH (benign prostatic  hyperplasia); Medication management; Osteoarthritis of knees, bilateral; and Overweight (BMI 25.0-29.9) on their problem list.   Observations/Objective:   BP 134/84   P 75   T 97 F   R 16   Ht 5' 7.25"   Wt 174 lb    SpO2 97%   BMI 27.11   Dry hacking cough. No respiratory distress. Slight hoarseness.   HEENT - WNL. Neck - supple.  Chest - scattered rales and fed forced end post tussive expiratory wheezes.  Cor - Nl HS. RRR w/o sig MGR. PP 1(+). No edema. MS- FROM w/o deformities.  Gait Nl. Neuro -  Nl w/o focal abnormalities.  Assessment and Plan:  1. Tracheitis  - dexamethasone  4 MG tablet; Take 1 tab 3 x day - 3 days, then 2 x day - 3 days, then 1 tab daily  Dispense: 20 tablet  - azithromycin  250 MG tablet; Take 2 tablets with Food on  Day 1, then 1 tablet Daily with Food for Infection  Dispense: 6 each; Refill: 1  2. Need for immunization against influenza  - Flu vaccine HIGH DOSE PF (Fluzone High dose)  Follow Up Instructions:        I discussed the assessment and treatment plan with the patient. The patient was provided an opportunity to ask questions and all were answered. The patient agreed with the plan and demonstrated an understanding of the instructions.       The patient was advised to call back or seek an in-person evaluation if the symptoms worsen or if the condition fails to improve as anticipated.  Kirtland Bouchard, MD

## 2020-10-25 ENCOUNTER — Other Ambulatory Visit: Payer: Self-pay | Admitting: Internal Medicine

## 2020-10-25 NOTE — Progress Notes (Signed)
v

## 2020-12-05 ENCOUNTER — Other Ambulatory Visit: Payer: Self-pay | Admitting: Adult Health

## 2021-01-16 DIAGNOSIS — M5116 Intervertebral disc disorders with radiculopathy, lumbar region: Secondary | ICD-10-CM | POA: Diagnosis not present

## 2021-01-16 DIAGNOSIS — M9903 Segmental and somatic dysfunction of lumbar region: Secondary | ICD-10-CM | POA: Diagnosis not present

## 2021-01-16 DIAGNOSIS — M62830 Muscle spasm of back: Secondary | ICD-10-CM | POA: Diagnosis not present

## 2021-01-16 DIAGNOSIS — S335XXA Sprain of ligaments of lumbar spine, initial encounter: Secondary | ICD-10-CM | POA: Diagnosis not present

## 2021-01-18 DIAGNOSIS — S335XXA Sprain of ligaments of lumbar spine, initial encounter: Secondary | ICD-10-CM | POA: Diagnosis not present

## 2021-01-18 DIAGNOSIS — M62830 Muscle spasm of back: Secondary | ICD-10-CM | POA: Diagnosis not present

## 2021-01-18 DIAGNOSIS — M5116 Intervertebral disc disorders with radiculopathy, lumbar region: Secondary | ICD-10-CM | POA: Diagnosis not present

## 2021-01-18 DIAGNOSIS — M9903 Segmental and somatic dysfunction of lumbar region: Secondary | ICD-10-CM | POA: Diagnosis not present

## 2021-01-23 DIAGNOSIS — M5116 Intervertebral disc disorders with radiculopathy, lumbar region: Secondary | ICD-10-CM | POA: Diagnosis not present

## 2021-01-23 DIAGNOSIS — S335XXA Sprain of ligaments of lumbar spine, initial encounter: Secondary | ICD-10-CM | POA: Diagnosis not present

## 2021-01-23 DIAGNOSIS — M9903 Segmental and somatic dysfunction of lumbar region: Secondary | ICD-10-CM | POA: Diagnosis not present

## 2021-01-23 DIAGNOSIS — M62830 Muscle spasm of back: Secondary | ICD-10-CM | POA: Diagnosis not present

## 2021-02-13 ENCOUNTER — Encounter: Payer: Self-pay | Admitting: Internal Medicine

## 2021-02-13 NOTE — Progress Notes (Signed)
Annual  Screening/Preventative Visit  & Comprehensive Evaluation & Examination   Future Appointments  Date Time Provider [place]  02/14/2021  9:00 AM Unk Pinto, MD [facility] None  05/22/2021  3:30 PM Garnet Sierras, NP [facility] None  02/18/2022 11:00 AM Unk Pinto, MD [facility] None                                               This very nice 80 y.o. married 25 man presents for a Screening /Preventative Visit & comprehensive evaluation and management of multiple medical co-morbidities.  Patient has been followed for HTN, HLD, Prediabetes and Vitamin D Deficiency. Patient has hx/o chronic intermittent Allergic  Asthma.       HTN predates since 1996. Patient's BP has been controlled at home.  Today's BP is at goal - 118/64. Patient denies any cardiac symptoms as chest pain, palpitations, shortness of breath, dizziness or ankle swelling.      Patient's hyperlipidemia is not controlled with diet and off his Rosuvastatin. Patient denies myalgias or other medication SE's. Last lipids were not at goal:  Lab Results  Component Value Date   CHOL 291 (H) 08/22/2020   HDL 60 08/22/2020   LDLCALC 199 (H) 08/22/2020   TRIG 156 (H) 08/22/2020   CHOLHDL 4.9 08/22/2020        Patient has prediabetes (A1c 6.3% /2013)and patient denies reactive hypoglycemic symptoms, visual blurring, diabetic polys or paresthesias. Last A1c was not at goal:    Lab Results  Component Value Date   HGBA1C 5.8 (H) 08/22/2020         Finally, patient has history of Vitamin D Deficiency ("24" /2008) and last vitamin D was elevated & dose was decreased    Lab Results  Component Value Date   VD25OH 140 (H) 02/09/2020    Current Outpatient Medications on File Prior to Visit  Medication Sig  . aspirin EC 81 MG tablet Take daily.  Marland Kitchen CALAN SR 240 MG CR TAKE 1 TABLET DAILY WITH FOOD  . cetirizine 10 MG tablet TAKE 1 TABLET DAILY FOR ALLERGIES  . ADVAIR DISKUS 250-50  USE 1  INHALATION TWICE A DAY  . COMBIVENT RESPIMAT respimat Inhale 1 puff  every 4  hours as needed for wheezing.  Marland Kitchen losartan50 MG tablet TAKE 1 TABLET DAILY   . montelukast 10 MG tablet TAKE 1 TABLET DAILY   . Multiple Vitamin Take  daily.  . rosuvastatin 40 MG tablet TAKE 1 TABLET DAILY  . Vitamin D 50,000 u TAKE 1 CAPSULE DAILY (Patient taking differently: 70,000 Units.     Allergies  Allergen Reactions  . Mavik [Trandolapril]     Cough  . Hctz [Hydrochlorothiazide] Rash    Photosensitivity    Past Medical History:  Diagnosis Date  . Allergy   . Asthma   . BPH (benign prostatic hyperplasia)   . GERD (gastroesophageal reflux disease)   . History of colon cancer   . Hyperlipidemia   . Hypertension   . Prediabetes   . Vitamin D deficiency     Health Maintenance  Topic Date Due  . TETANUS/TDAP  04/20/2019  . COVID-19 Vaccine (3 - Booster for Pfizer series) 07/10/2020  . Hepatitis C Screening  05/16/2021 (Originally [birth year])  . INFLUENZA VACCINE  06/03/2021  . PNA vac Low Risk Adult  Completed  . HPV VACCINES  Aged [employer] History  Administered Date(s) Administered  . DT (Pediatric) 07/10/2009  . Influenza Split 07/28/2013  . Influenza, High Dose Seasonal PF 09/18/2014, 07/26/2015, 08/18/2016, 07/13/2017, 07/28/2018, 08/03/2019, 10/08/2020  . PFIZER SARS-COV-2 Vacc 12/17/2019, 01/08/2020  . Pneumococcal Conjugate-13 02/07/2016  . Pneumococcal Polysaccharide-23 02/22/2007    Last Colon -  03/17/2016 - Dr Earlean Shawl   Recc 5 year  f/u May 2022  Past Surgical History:  Procedure Laterality Date  . COLON SURGERY  2004   Dr Rosana Hoes    Family History  Problem Relation Age of Onset  . Heart disease Mother   . Cancer Father   . Cancer Sister   . Autoimmune disease Brother     Social History   Socioeconomic History  . Marital status: Married    Spouse name: Not on file  . Number of children: Not on file  Occupational History  . Retired [employer]   Tobacco Use  . Smoking status: Never Smoker  . Smokeless tobacco: Never Used  Substance and Sexual Activity  . Alcohol use: No  . Drug use: No  . Sexual activity: Not on file    ROS Constitutional: Denies fever, chills, weight loss/gain, headaches, insomnia,  night sweats or change in appetite. Does c/o fatigue. Eyes: Denies redness, blurred vision, diplopia, discharge, itchy or watery eyes.  ENT: Denies discharge, congestion, post nasal drip, epistaxis, sore throat, earache, hearing loss, dental pain, Tinnitus, Vertigo, Sinus pain or snoring.  Cardio: Denies chest pain, palpitations, irregular heartbeat, syncope, dyspnea, diaphoresis, orthopnea, PND, claudication or edema Respiratory: denies cough, dyspnea, DOE, pleurisy, hoarseness, laryngitis or wheezing.  Gastrointestinal: Denies dysphagia, heartburn, reflux, water brash, pain, cramps, nausea, vomiting, bloating, diarrhea, constipation, hematemesis, melena, hematochezia, jaundice or hemorrhoids Genitourinary: Denies dysuria, frequency, urgency, nocturia, hesitancy, discharge, hematuria or flank pain Musculoskeletal: Denies arthralgia, myalgia, stiffness, Jt. Swelling, pain, limp or strain/sprain. Denies Falls. Skin: Denies puritis, rash, hives, warts, acne, eczema or change in skin lesion Neuro: No weakness, tremor, incoordination, spasms, paresthesia or pain Psychiatric: Denies confusion, memory loss or sensory loss. Denies Depression. Endocrine: Denies change in weight, skin, hair change, nocturia, and paresthesia, diabetic polys, visual blurring or hyper / hypo glycemic episodes.  Heme/Lymph: No excessive bleeding, bruising or enlarged lymph nodes.  Physical Exam  BP 118/64   Pulse 80   Temp 97.7 F (36.5 C) (Temporal)   Resp 16   Ht 5' 7.5" (1.715 m)   Wt 164 lb 3.2 oz (74.5 kg)   SpO2 98%   BMI 25.34 kg/m   General Appearance: Well nourished and well groomed and in no apparent distress.  Eyes: PERRLA, EOMs,  conjunctiva no swelling or erythema, normal fundi and vessels. Sinuses: No frontal/maxillary tenderness ENT/Mouth: EACs patent / TMs  nl. Nares clear without erythema, swelling, mucoid exudates. Oral hygiene is good. No erythema, swelling, or exudate. Tongue normal, non-obstructing. Tonsils not swollen or erythematous. Hearing normal.  Neck: Supple, thyroid not palpable. No bruits, nodes or JVD. Respiratory: Respiratory effort normal.  BS equal and clear bilateral without rales, rhonci, wheezing or stridor. Cardio: Heart sounds are normal with regular rate and rhythm and no murmurs, rubs or gallops. Peripheral pulses are normal and equal bilaterally without edema. No aortic or femoral bruits. Chest: symmetric with normal excursions and percussion.  Abdomen: Soft, with Nl bowel sounds. Nontender, no guarding, rebound, hernias, masses, or organomegaly.  Lymphatics: Non tender without lymphadenopathy.  Musculoskeletal: Full ROM all peripheral extremities, joint stability, 5/5 strength, and normal gait. Skin:  Warm and dry without rashes, lesions, cyanosis, clubbing or  ecchymosis.  Neuro: Cranial nerves intact, reflexes equal bilaterally. Normal muscle tone, no cerebellar symptoms. Sensation intact.  Pysch: Alert and oriented X 3 with normal affect, insight and judgment appropriate.   Assessment and Plan  1. Annual Preventative/Screening Exam     2. Essential hypertension  - EKG 12-Lead - [place], RETROPERITNL ABD,  LTD - Urinalysis, Routine w reflex microscopic - Microalbumin / creatinine urine ratio - CBC with Differential/Platelet - COMPLETE METABOLIC PANEL WITH GFR - Magnesium - TSH  3. Hyperlipidemia, mixed  - EKG 12-Lead - [place], RETROPERITNL ABD,  LTD - Lipid panel - TSH - Insulin, random  4. Abnormal glucose  - EKG 12-Lead - [place], RETROPERITNL ABD,  LTD - Hemoglobin A1c - Insulin, random  5. Vitamin D deficiency  - VITAMIN D 25 Hydroxy   6. BPH with obstruction/lower  urinary tract symptoms  - PSA  7. Mild persistent asthma without complication   8. History of colon cancer   9. Screening for colorectal cancer  - POC Hemoccult Bld/Stl   10. Screening for prostate cancer  - PSA  11. Screening for ischemic heart disease  - EKG 12-Lead  12. FH: heart disease  - EKG 12-Lead - [place], RETROPERITNL ABD,  LTD  13. Aortic ectasia, thoracoabdominal (HCC)  - EKG 12-Lead - [place], RETROPERITNL ABD,  LTD  14. Screening for AAA (aortic abdominal aneurysm)  - [place], RETROPERITNL ABD,  LTD  15. Medication management  - Urinalysis, Routine w reflex microscopic - Microalbumin / creatinine urine ratio - PSA - CBC with Differential/Platelet - COMPLETE METABOLIC PANEL WITH GFR - Magnesium - Lipid panel - TSH - Hemoglobin A1c - Insulin, random - VITAMIN D 25 Hydroxy          Patient was counseled in prudent diet, weight control to achieve/maintain BMI less than 25, BP monitoring, regular exercise and medications as discussed.  Discussed med effects and SE's. Routine screening labs and tests as requested with regular follow-up as recommended. Over 40 minutes of exam, counseling, chart review and high complex critical decision making was performed   Kirtland Bouchard, MD

## 2021-02-13 NOTE — Patient Instructions (Signed)

## 2021-02-14 ENCOUNTER — Ambulatory Visit (INDEPENDENT_AMBULATORY_CARE_PROVIDER_SITE_OTHER): Payer: Medicare PPO | Admitting: Internal Medicine

## 2021-02-14 ENCOUNTER — Other Ambulatory Visit: Payer: Self-pay

## 2021-02-14 VITALS — BP 118/64 | HR 80 | Temp 97.7°F | Resp 16 | Ht 67.5 in | Wt 164.2 lb

## 2021-02-14 DIAGNOSIS — J4530 Mild persistent asthma, uncomplicated: Secondary | ICD-10-CM

## 2021-02-14 DIAGNOSIS — I77812 Thoracoabdominal aortic ectasia: Secondary | ICD-10-CM | POA: Diagnosis not present

## 2021-02-14 DIAGNOSIS — I10 Essential (primary) hypertension: Secondary | ICD-10-CM

## 2021-02-14 DIAGNOSIS — Z79899 Other long term (current) drug therapy: Secondary | ICD-10-CM | POA: Diagnosis not present

## 2021-02-14 DIAGNOSIS — E559 Vitamin D deficiency, unspecified: Secondary | ICD-10-CM | POA: Diagnosis not present

## 2021-02-14 DIAGNOSIS — N401 Enlarged prostate with lower urinary tract symptoms: Secondary | ICD-10-CM | POA: Diagnosis not present

## 2021-02-14 DIAGNOSIS — E782 Mixed hyperlipidemia: Secondary | ICD-10-CM

## 2021-02-14 DIAGNOSIS — N138 Other obstructive and reflux uropathy: Secondary | ICD-10-CM | POA: Diagnosis not present

## 2021-02-14 DIAGNOSIS — Z0001 Encounter for general adult medical examination with abnormal findings: Secondary | ICD-10-CM

## 2021-02-14 DIAGNOSIS — Z136 Encounter for screening for cardiovascular disorders: Secondary | ICD-10-CM

## 2021-02-14 DIAGNOSIS — Z8249 Family history of ischemic heart disease and other diseases of the circulatory system: Secondary | ICD-10-CM

## 2021-02-14 DIAGNOSIS — Z130 Encounter for screening for diseases of the blood and blood-forming organs and certain disorders involving the immune mechanism: Secondary | ICD-10-CM | POA: Diagnosis not present

## 2021-02-14 DIAGNOSIS — Z125 Encounter for screening for malignant neoplasm of prostate: Secondary | ICD-10-CM

## 2021-02-14 DIAGNOSIS — R7309 Other abnormal glucose: Secondary | ICD-10-CM

## 2021-02-14 DIAGNOSIS — Z0000 Encounter for general adult medical examination without abnormal findings: Secondary | ICD-10-CM | POA: Diagnosis not present

## 2021-02-14 DIAGNOSIS — Z1211 Encounter for screening for malignant neoplasm of colon: Secondary | ICD-10-CM

## 2021-02-14 DIAGNOSIS — Z1212 Encounter for screening for malignant neoplasm of rectum: Secondary | ICD-10-CM

## 2021-02-14 DIAGNOSIS — Z85038 Personal history of other malignant neoplasm of large intestine: Secondary | ICD-10-CM

## 2021-02-14 MED ORDER — DEXAMETHASONE 4 MG PO TABS: ORAL_TABLET | ORAL | 1 refills | Status: DC

## 2021-02-14 MED ORDER — AZITHROMYCIN 250 MG PO TABS: ORAL_TABLET | ORAL | 1 refills | Status: DC

## 2021-02-14 NOTE — Progress Notes (Signed)
Aortascan < 3 cm.  Normal per Dr. Melford Aase.

## 2021-02-15 LAB — MICROALBUMIN / CREATININE URINE RATIO
Creatinine, Urine: 20 mg/dL (ref 20–320)
Microalb Creat Ratio: 10 mcg/mg creat (ref <30)
Microalb, Ur: 0.2 mg/dL

## 2021-02-15 LAB — CBC WITH DIFFERENTIAL/PLATELET
Absolute Monocytes: 499 cells/uL (ref 200–950)
Basophils Absolute: 130 cells/uL (ref 0–200)
Basophils Relative: 2.5 %
Eosinophils Absolute: 608 cells/uL — ABNORMAL HIGH (ref 15–500)
Eosinophils Relative: 11.7 %
HCT: 44.8 % (ref 38.5–50.0)
Hemoglobin: 14.4 g/dL (ref 13.2–17.1)
Lymphs Abs: 1253 cells/uL (ref 850–3900)
MCH: 29.5 pg (ref 27.0–33.0)
MCHC: 32.1 g/dL (ref 32.0–36.0)
MCV: 91.8 fL (ref 80.0–100.0)
MPV: 10.1 fL (ref 7.5–12.5)
Monocytes Relative: 9.6 %
Neutro Abs: 2709 cells/uL (ref 1500–7800)
Neutrophils Relative %: 52.1 %
Platelets: 194 10*3/uL (ref 140–400)
RBC: 4.88 10*6/uL (ref 4.20–5.80)
RDW: 13.3 % (ref 11.0–15.0)
Total Lymphocyte: 24.1 %
WBC: 5.2 10*3/uL (ref 3.8–10.8)

## 2021-02-15 LAB — HEMOGLOBIN A1C
Hgb A1c MFr Bld: 5.9 % of total Hgb — ABNORMAL HIGH (ref <5.7)
Mean Plasma Glucose: 123 mg/dL
eAG (mmol/L): 6.8 mmol/L

## 2021-02-15 LAB — LIPID PANEL
Cholesterol: 185 mg/dL (ref <200)
HDL: 70 mg/dL (ref >=40)
LDL Cholesterol (Calc): 95 mg/dL (calc)
Non-HDL Cholesterol (Calc): 115 mg/dL (calc) (ref <130)
Total CHOL/HDL Ratio: 2.6 (calc) (ref <5.0)
Triglycerides: 100 mg/dL (ref <150)

## 2021-02-15 LAB — URINALYSIS, ROUTINE W REFLEX MICROSCOPIC
Bilirubin Urine: NEGATIVE
Glucose, UA: NEGATIVE
Hgb urine dipstick: NEGATIVE
Ketones, ur: NEGATIVE
Leukocytes,Ua: NEGATIVE
Nitrite: NEGATIVE
Protein, ur: NEGATIVE
Specific Gravity, Urine: 1.003 (ref 1.001–1.03)
pH: 7 (ref 5.0–8.0)

## 2021-02-15 LAB — VITAMIN D 25 HYDROXY (VIT D DEFICIENCY, FRACTURES): Vit D, 25-Hydroxy: 137 ng/mL — ABNORMAL HIGH (ref 30–100)

## 2021-02-15 LAB — COMPLETE METABOLIC PANEL WITH GFR
AG Ratio: 1.3 (calc) (ref 1.0–2.5)
ALT: 18 U/L (ref 9–46)
AST: 22 U/L (ref 10–35)
Albumin: 4.3 g/dL (ref 3.6–5.1)
Alkaline phosphatase (APISO): 93 U/L (ref 35–144)
BUN: 15 mg/dL (ref 7–25)
CO2: 27 mmol/L (ref 20–32)
Calcium: 9.8 mg/dL (ref 8.6–10.3)
Chloride: 104 mmol/L (ref 98–110)
Creat: 0.99 mg/dL (ref 0.70–1.18)
GFR, Est African American: 84 mL/min/{1.73_m2} (ref >=60)
GFR, Est Non African American: 72 mL/min/{1.73_m2} (ref >=60)
Globulin: 3.2 g/dL (calc) (ref 1.9–3.7)
Glucose, Bld: 88 mg/dL (ref 65–99)
Potassium: 4.1 mmol/L (ref 3.5–5.3)
Sodium: 142 mmol/L (ref 135–146)
Total Bilirubin: 0.5 mg/dL (ref 0.2–1.2)
Total Protein: 7.5 g/dL (ref 6.1–8.1)

## 2021-02-15 LAB — TSH: TSH: 0.92 mIU/L (ref 0.40–4.50)

## 2021-02-15 LAB — IRON, TOTAL/TOTAL IRON BINDING CAP
%SAT: 29 % (calc) (ref 20–48)
Iron: 109 ug/dL (ref 50–180)
TIBC: 381 mcg/dL (calc) (ref 250–425)

## 2021-02-15 LAB — MAGNESIUM: Magnesium: 2.4 mg/dL (ref 1.5–2.5)

## 2021-02-15 LAB — PSA: PSA: 0.87 ng/mL (ref <=4.0)

## 2021-02-15 LAB — INSULIN, RANDOM: Insulin: 6 u[IU]/mL

## 2021-02-15 NOTE — Progress Notes (Signed)
============================================================ -   Test results slightly outside the reference range are not unusual. If there is anything important, I will review this with you,  otherwise it is considered normal test values.  If you have further questions,  please do not hesitate to contact me at the office or via My Chart.  ============================================================ ============================================================  -  PSA - Low - Great  ============================================================ ============================================================  - Total Chol  - Much better - Down from 291 to Now 185 and  LDL also much better - down from 199 to now 95  - both  Excellent   - Very low risk for Heart Attack  / Stroke ============================================================ ============================================================  - A1c = 5.9% - still borderline elevated sugar   - Avoid Sweets, Candy & White Stuff   - White Rice, White [place], White Flour  - Breads &  Pasta ============================================================ ============================================================  - Vitamin D - 137 - way to high  Recommend stop the 70,000 units once /week   & as discussed change to 10,000 units every day to give a more consistent level ============================================================ ============================================================  - Iron Level is Normal   -All Else - CBC - Kidneys - Electrolytes - Liver - Magnesium & Thyroid    - all  Normal / OK ===========================================================  ===========================================================   - Keep up the [place] Work !  ===========================================================  ===========================================================

## 2021-03-01 ENCOUNTER — Other Ambulatory Visit: Payer: Self-pay | Admitting: Internal Medicine

## 2021-04-04 ENCOUNTER — Other Ambulatory Visit: Payer: Self-pay | Admitting: Adult Health

## 2021-05-03 ENCOUNTER — Other Ambulatory Visit: Payer: Self-pay

## 2021-05-03 DIAGNOSIS — J4530 Mild persistent asthma, uncomplicated: Secondary | ICD-10-CM

## 2021-05-03 MED ORDER — COMBIVENT RESPIMAT 20-100 MCG/ACT IN AERS: 1.0000 | INHALATION_SPRAY | RESPIRATORY_TRACT | 4 refills | Status: DC | PRN

## 2021-05-20 ENCOUNTER — Other Ambulatory Visit: Payer: Self-pay

## 2021-05-20 MED ORDER — FLUTICASONE-SALMETEROL 250-50 MCG/ACT IN AEPB: 1.0000 | INHALATION_SPRAY | Freq: Two times a day (BID) | RESPIRATORY_TRACT | 3 refills | Status: DC

## 2021-05-22 ENCOUNTER — Ambulatory Visit: Payer: Medicare PPO | Admitting: Adult Health Nurse Practitioner

## 2021-05-22 ENCOUNTER — Other Ambulatory Visit: Payer: Self-pay | Admitting: Internal Medicine

## 2021-06-04 DIAGNOSIS — H353111 Nonexudative age-related macular degeneration, right eye, early dry stage: Secondary | ICD-10-CM | POA: Diagnosis not present

## 2021-06-04 DIAGNOSIS — H524 Presbyopia: Secondary | ICD-10-CM | POA: Diagnosis not present

## 2021-06-04 DIAGNOSIS — H43813 Vitreous degeneration, bilateral: Secondary | ICD-10-CM | POA: Diagnosis not present

## 2021-06-04 DIAGNOSIS — H04123 Dry eye syndrome of bilateral lacrimal glands: Secondary | ICD-10-CM | POA: Diagnosis not present

## 2021-06-04 DIAGNOSIS — H2513 Age-related nuclear cataract, bilateral: Secondary | ICD-10-CM | POA: Diagnosis not present

## 2021-06-11 DIAGNOSIS — H04123 Dry eye syndrome of bilateral lacrimal glands: Secondary | ICD-10-CM | POA: Diagnosis not present

## 2021-08-06 ENCOUNTER — Other Ambulatory Visit: Payer: Self-pay | Admitting: Adult Health

## 2021-08-06 DIAGNOSIS — E782 Mixed hyperlipidemia: Secondary | ICD-10-CM

## 2021-08-29 NOTE — Progress Notes (Signed)
MEDICARE ANNUAL WELLNESS VISIT AND FOLLOW UP Assessment:    Annual Medicare Wellness Visit Due annually  Health maintenance reviewed  Essential hypertension - atypically elevated today, lifestyle reviewed, typically very well controlled, reduce sodium, keep log and return for recheck in 4 weeks -DASH diet, exercise and monitor at home. Call if persistently greater than 130/80.   Mixed hyperlipidemia continue crestor -continue medications, check lipids, decrease fatty foods, increase activity.   Prediabetes Discussed general issues about diabetes pathophysiology and management., Educational material distributed., Suggested low cholesterol diet., Encouraged aerobic exercise., Discussed foot care., Reminded to get yearly retinal exam. Check A1C q36m; monitor CMP  Vitamin D deficiency Above goal at recent check; has reduced dose per recommendations continue to recommend supplementation for goal of 60-100 Defer vitamin D level due to insurance/cost to next visit   Medication management Each visit   Uncomplicated asthma, unspecified asthma severity, unspecified whether persistent Controlled on inhalers/singulaire, avoid triggers   Gastroesophageal reflux disease, esophagitis presence not specified Continue PPI/H2 blocker, diet discussed  Benign prostatic hyperplasia with nocturia Continue to monitor PSA at CPE  History of colon cancer Due colonoscopy, referral placed to GI  Overweight - BMI 25 Long discussion about weight loss, diet, and exercise Recommended diet heavy in fruits and veggies and low in animal meats, cheeses, and dairy products, appropriate calorie intake Discussed appropriate weight for height  Follow up at next visit  Osteoarthritis of bilateral knees Followed by Dr. Sharol Given getting injections, tylenol PRN, swimming helps  Need for influenza vaccine High dose quadrivalent administered without complication today     Future Appointments  Date Time Provider  [place]  10/01/2021  9:00 AM [facility] NURSE [facility] None  02/18/2022 11:00 AM Unk Pinto, MD [facility] None  08/22/2022 10:00 AM Liane Comber, NP [facility] None     Plan:   During the course of the visit the patient was educated and counseled about appropriate screening and preventive services including:   Pneumococcal vaccine  Prevnar 13 Influenza vaccine Td vaccine Screening electrocardiogram Bone densitometry screening Colorectal cancer screening Diabetes screening Glaucoma screening Nutrition counseling  Advanced directives: requested   Subjective:  Jon [name] is a 80 y.o. male who presents for Medicare Annual Wellness Visit and 3 month follow up for HTN, hyperlipidemia, prediabetes, and vitamin D Def.   Still working part time but planning to fully retire this December. Volunteers with [employer]   He had covid 19 early October, recovered without sequela after steroid taper.   He has a history of colon cancer in 2004, up to date on colonoscopy.  Asthma well controlled on inhalers and singulair   He is followed by Dr. Sharol Given for R knee pain and getting knee injections which are helpful, walking and swimming has been helpful.   BMI is Body mass index is 25.62 kg/m., he has been working on diet and exercise, swims at walks intermittently as knee allows.  Wt Readings from Last 3 Encounters:  08/30/21 166 lb (75.3 kg)  02/14/21 164 lb 3.2 oz (74.5 kg)  10/08/20 174 lb 6.4 oz (79.1 kg)   His blood pressure is typically well controlled (110s-120s/70s), has cuff but hasn't been checking, today their BP is BP: (!) 160/80, similar on recheck. Admits very stressful week and has been eating out in the evening, lots of fast food.   He does workout, no longer running due to knees, swimming 3 days a week, walking as able.  He denies chest pain, shortness of breath, dizziness.  He  is on cholesterol medication (crestor 40 mg) and denies myalgias.  His cholesterol is at goal. The cholesterol last visit was:   Lab Results  Component Value Date   CHOL 185 02/14/2021   HDL 70 02/14/2021   LDLCALC 95 02/14/2021   TRIG 100 02/14/2021   CHOLHDL 2.6 02/14/2021   He has been working on diet and exercise for prediabetes but admits he likes bread and sweets, has cut down potatoes and , and denies paresthesia of the feet, polydipsia and polyuria. Last A1C in the office was:  Lab Results  Component Value Date   HGBA1C 5.9 (H) 02/14/2021    Last GFR:  Lab Results  Component Value Date   GFRNONAA 72 02/14/2021   Patient is on Vitamin D supplement and above goal, has reduced from 10000 IU to 2000 IU daily   Lab Results  Component Value Date   VD25OH 137 (H) 02/14/2021     Medication Review: Current Outpatient Medications on File Prior to Visit  Medication Sig Dispense Refill   Folic Acid-Cholecalciferol (CHOLECAL DF PO) Take 2,000 Units by mouth daily.     aspirin EC 81 MG tablet Take 81 mg by mouth daily. Swallow whole.     azithromycin (ZITHROMAX) 250 MG tablet Take 2 tablets with Food on  Day 1, then 1 tablet Daily with Food for Infection 6 each 1   CALAN SR 240 MG CR tablet TAKE 1 TABLET DAILY WITH FOOD 90 tablet 3   cetirizine (ZYRTEC) 10 MG tablet TAKE 1 TABLET DAILY FOR ALLERGIES 90 tablet 3   fluticasone-salmeterol (ADVAIR DISKUS) 250-50 MCG/ACT AEPB Inhale 1 puff into the lungs in the morning and at bedtime. 3 each 3   Fluticasone-Salmeterol (ADVAIR DISKUS) 250-50 MCG/DOSE AEPB USE 1 INHALATION TWICE A DAY 180 each 4   Ipratropium-Albuterol (COMBIVENT RESPIMAT) 20-100 MCG/ACT AERS respimat Inhale 1 puff into the lungs every 4 (four) hours as needed for wheezing. 4 g 4   losartan (COZAAR) 50 MG tablet TAKE 1 TABLET DAILY FOR BLOOD PRESSURE 90 tablet 3   montelukast (SINGULAIR) 10 MG tablet TAKE 1 TABLET DAILY FOR ALLERGY AND ASTHMA 90 tablet 3   Multiple Vitamin (MULTIVITAMIN PO) Take by mouth daily.     rosuvastatin (CRESTOR)  40 MG tablet TAKE 1 TABLET DAILY FOR CHOLESTEROL 90 tablet 3   No current facility-administered medications on file prior to visit.    Current Problems (verified) Patient Active Problem List   Diagnosis Date Noted   Overweight (BMI 25.0-29.9) 04/22/2018   Osteoarthritis of knees, bilateral 06/24/2017   Medication management 02/16/2014   Hyperlipidemia    Hypertension    Vitamin D deficiency    Prediabetes    COPD with asthma ([place])    GERD (gastroesophageal reflux disease)    History of colon cancer    BPH (benign prostatic hyperplasia)     Screening Tests Immunization History  Administered Date(s) Administered   DT (Pediatric) 07/10/2009   Influenza Split 07/28/2013   Influenza, High Dose Seasonal PF 09/18/2014, 07/26/2015, 08/18/2016, 07/13/2017, 07/28/2018, 08/03/2019, 10/08/2020, 08/30/2021   PFIZER(Purple Top)SARS-COV-2 Vaccination 12/17/2019, 01/08/2020   Pneumococcal Conjugate-13 02/07/2016   Pneumococcal Polysaccharide-23 02/22/2007   Tdap 06/03/2020   Preventative care: Last colonoscopy: 03/2016, Dr. Earlean Shawl, repeat colonoscopy 5 years, DUE - referral placed   Prior vaccinations: TD or Tdap: 06/2020 [facility] Influenza: 07/2019 TODAY  Pneumococcal: 2008 Prevnar 13: 2017  Shingles/Zostavax: declines Covid 19: 2/2, 2021, pfizer + boosters - dates requested  Names of Other Physician/Practitioners  you currently use: 1. [facility] here for primary care 2. [employer], dentist, next appointment in 2022 3.  Dr. Bing [facility], 2022  Patient Care Team: Unk Pinto, MD as PCP - General (Internal Medicine) Calvert Cantor, MD as Consulting Physician (Ophthalmology) Richmond Campbell, MD as Consulting Physician (Gastroenterology) Ladell Pier, MD as Consulting Physician (Oncology)  History reviewed: allergies, current medications, past family history, past medical history, past social history, past surgical history and problem  list  Allergies Allergies  Allergen Reactions   Mavik [Trandolapril]     Cough   Hctz [Hydrochlorothiazide] Rash    Photosensitivity    SURGICAL HISTORY He  has a past surgical history that includes Colon surgery (2004). FAMILY HISTORY His family history includes Autoimmune disease in his brother; Cancer in his father and sister; Heart disease in his mother. SOCIAL HISTORY He  reports that he has never smoked. He has never used smokeless tobacco. He reports that he does not drink alcohol and does not use drugs.   MEDICARE WELLNESS OBJECTIVES: Physical activity: Current Exercise Habits: Home exercise routine, Type of exercise: walking, Time (Minutes): 30, Frequency (Times/Week): 3, Weekly Exercise (Minutes/Week): 90, Intensity: Mild, Exercise limited by: orthopedic condition(s) Cardiac risk factors: Cardiac Risk Factors include: advanced age (>19men, >77 women);dyslipidemia;hypertension;male gender Depression/mood screen:   Depression screen [facility] 2/9 08/30/2021  Decreased Interest 0  Down, Depressed, Hopeless 0  PHQ - 2 Score 0    ADLs:  In your present state of health, do you have any difficulty performing the following activities: 08/30/2021 02/13/2021  Hearing? N N  Vision? N N  Difficulty concentrating or making decisions? N N  Walking or climbing stairs? N N  Dressing or bathing? N N  Doing errands, shopping? N N  Some recent data might be hidden     Cognitive Testing  Alert? Yes  Normal Appearance?Yes  Oriented to person? Yes  Place? Yes   Time? Yes  Recall of three objects?  Yes  Can perform simple calculations? Yes  Displays appropriate judgment?Yes  Can read the correct time from a watch face?Yes  EOL planning: Does Patient Have a Medical Advance Directive?: Yes Type of Advance Directive: Healthcare Power of Attorney, Living will Does patient want to make changes to medical advance directive?: No - Patient declined Copy of [place] in  Chart?: Yes - validated most recent copy scanned in chart (See row information)   Objective:   Blood pressure (!) 160/80, pulse 79, temperature (!) 97.5 F (36.4 C), weight 166 lb (75.3 kg), SpO2 99 %. Body mass index is 25.62 kg/m.  General appearance: alert, no distress, WD/WN, male HEENT: normocephalic, sclerae anicteric, TMs pearly, nares patent, no discharge or erythema, pharynx normal Oral cavity: MMM, no lesions Neck: supple, no lymphadenopathy, no thyromegaly, no masses Heart: RRR, normal S1, S2, no murmurs Lungs: CTA bilaterally, no wheezes, rhonchi, or rales Abdomen: +bs, soft, non tender, non distended, no masses, no hepatomegaly, no splenomegaly Musculoskeletal: nontender, no swelling, no obvious deformity.  Extremities: no edema, no cyanosis, no clubbing Pulses: 2+ symmetric, upper and lower extremities, normal cap refill Neurological: alert, oriented x 3, CN2-12 intact, strength normal upper extremities and lower extremities, sensation normal throughout, DTRs 2+ throughout, no cerebellar signs, gait normal Psychiatric: normal affect, behavior normal, pleasant   Medicare Attestation I have personally reviewed: The patient's medical and social history Their use of alcohol, tobacco or illicit drugs Their current medications and supplements The patient's functional ability  including ADLs,fall risks, home safety risks, cognitive, and hearing and visual impairment Diet and physical activities Evidence for depression or mood disorders  The patient's weight, height, BMI, and visual acuity have been recorded in the chart.  I have made referrals, counseling, and provided education to the patient based on review of the above and I have provided the patient with a written personalized care plan for preventive services.     Izora Ribas, NP   08/30/2021

## 2021-08-30 ENCOUNTER — Ambulatory Visit (INDEPENDENT_AMBULATORY_CARE_PROVIDER_SITE_OTHER): Payer: Medicare PPO | Admitting: Adult Health

## 2021-08-30 ENCOUNTER — Other Ambulatory Visit: Payer: Self-pay

## 2021-08-30 ENCOUNTER — Encounter: Payer: Self-pay | Admitting: Adult Health

## 2021-08-30 VITALS — BP 160/80 | HR 79 | Temp 97.5°F | Wt 166.0 lb

## 2021-08-30 DIAGNOSIS — R7303 Prediabetes: Secondary | ICD-10-CM | POA: Diagnosis not present

## 2021-08-30 DIAGNOSIS — Z0001 Encounter for general adult medical examination with abnormal findings: Secondary | ICD-10-CM

## 2021-08-30 DIAGNOSIS — E559 Vitamin D deficiency, unspecified: Secondary | ICD-10-CM

## 2021-08-30 DIAGNOSIS — K219 Gastro-esophageal reflux disease without esophagitis: Secondary | ICD-10-CM

## 2021-08-30 DIAGNOSIS — I10 Essential (primary) hypertension: Secondary | ICD-10-CM

## 2021-08-30 DIAGNOSIS — Z79899 Other long term (current) drug therapy: Secondary | ICD-10-CM

## 2021-08-30 DIAGNOSIS — J449 Chronic obstructive pulmonary disease, unspecified: Secondary | ICD-10-CM

## 2021-08-30 DIAGNOSIS — Z23 Encounter for immunization: Secondary | ICD-10-CM

## 2021-08-30 DIAGNOSIS — E782 Mixed hyperlipidemia: Secondary | ICD-10-CM

## 2021-08-30 DIAGNOSIS — E663 Overweight: Secondary | ICD-10-CM

## 2021-08-30 DIAGNOSIS — N401 Enlarged prostate with lower urinary tract symptoms: Secondary | ICD-10-CM | POA: Diagnosis not present

## 2021-08-30 DIAGNOSIS — R7309 Other abnormal glucose: Secondary | ICD-10-CM | POA: Diagnosis not present

## 2021-08-30 DIAGNOSIS — Z85038 Personal history of other malignant neoplasm of large intestine: Secondary | ICD-10-CM

## 2021-08-30 DIAGNOSIS — R6889 Other general symptoms and signs: Secondary | ICD-10-CM | POA: Diagnosis not present

## 2021-08-30 DIAGNOSIS — Z0000 Encounter for general adult medical examination without abnormal findings: Secondary | ICD-10-CM

## 2021-08-30 DIAGNOSIS — R351 Nocturia: Secondary | ICD-10-CM

## 2021-08-30 DIAGNOSIS — M170 Bilateral primary osteoarthritis of knee: Secondary | ICD-10-CM | POA: Diagnosis not present

## 2021-08-30 NOTE — Patient Instructions (Addendum)
Goals      DIET - INCREASE WATER INTAKE     5-6 bottles daily      HEMOGLOBIN A1C < 5.7         Please send covid 19 booster dates -    HYPERTENSION INFORMATION  Monitor your blood pressure at home, please keep a record and bring that in with you to your next office visit.   Go to the ER if any CP, SOB, nausea, dizziness, severe HA, changes vision/speech  Testing/Procedures: HOW TO TAKE YOUR BLOOD PRESSURE: Rest 5 minutes before taking your blood pressure. Don't smoke or drink caffeinated beverages for at least 30 minutes before. Take your blood pressure before (not after) you eat. Sit comfortably with your back supported and both feet on the floor (don't cross your legs). Elevate your arm to heart level on a table or a desk. Use the proper sized cuff. It should fit smoothly and snugly around your bare upper arm. There should be enough room to slip a fingertip under the cuff. The bottom edge of the cuff should be 1 inch above the crease of the elbow.  Your most recent BP: BP: (!) 160/80   Take your medications faithfully as instructed. Maintain a healthy weight. Get at least 150 minutes of aerobic exercise per week. Minimize salt intake. Minimize alcohol intake  DASH Eating Plan DASH stands for "Dietary Approaches to Stop Hypertension." The DASH eating plan is a healthy eating plan that has been shown to reduce high blood pressure (hypertension). Additional health benefits may include reducing the risk of type 2 diabetes mellitus, heart disease, and stroke. The DASH eating plan may also help with weight loss. WHAT DO I NEED TO KNOW ABOUT THE DASH EATING PLAN? For the DASH eating plan, you will follow these general guidelines: Choose foods with a percent daily value for sodium of less than 5% (as listed on the food label). Use salt-free seasonings or herbs instead of table salt or sea salt. Check with your health care provider or pharmacist before using salt  substitutes. Eat lower-sodium products, often labeled as "lower sodium" or "no salt added." Eat fresh foods. Eat more vegetables, fruits, and low-fat dairy products. Choose whole grains. Look for the word "whole" as the first word in the ingredient list. Choose fish and skinless chicken or [place] more often than red meat. Limit fish, poultry, and meat to 6 oz (170 g) each day. Limit sweets, desserts, sugars, and sugary drinks. Choose heart-healthy fats. Limit cheese to 1 oz (28 g) per day. Eat more home-cooked food and less restaurant, buffet, and fast food. Limit fried foods. Cook foods using methods other than frying. Limit canned vegetables. If you do use them, rinse them well to decrease the sodium. When eating at a restaurant, ask that your food be prepared with less salt, or no salt if possible. WHAT FOODS CAN I EAT? Seek help from a dietitian for individual calorie needs. Grains Whole grain or whole wheat bread. Brown rice. Whole grain or whole wheat pasta. Quinoa, bulgur, and whole grain cereals. Low-sodium cereals. Corn or whole wheat flour tortillas. Whole grain cornbread. Whole grain crackers. Low-sodium crackers. Vegetables Fresh or frozen vegetables (raw, steamed, roasted, or grilled). Low-sodium or reduced-sodium tomato and vegetable juices. Low-sodium or reduced-sodium tomato sauce and paste. Low-sodium or reduced-sodium canned vegetables.  Fruits All fresh, canned (in natural juice), or frozen fruits. Meat and Other Protein Products Ground beef (85% or leaner), grass-fed beef, or beef trimmed of fat. Skinless  chicken or [place]. Ground chicken or [place]. Pork trimmed of fat. All fish and seafood. Eggs. Dried beans, peas, or lentils. Unsalted nuts and seeds. Unsalted canned beans. Dairy Low-fat dairy products, such as skim or 1% milk, 2% or reduced-fat cheeses, low-fat ricotta or cottage cheese, or plain low-fat yogurt. Low-sodium or reduced-sodium cheeses. Fats and Oils Tub  margarines without trans fats. Light or reduced-fat mayonnaise and salad dressings (reduced sodium). Avocado. Safflower, olive, or canola oils. Natural peanut or almond butter. Other Unsalted popcorn and pretzels. The items listed above may not be a complete list of recommended foods or beverages. Contact your dietitian for more options. WHAT FOODS ARE NOT RECOMMENDED? Grains White bread. White pasta. White rice. Refined cornbread. Bagels and croissants. Crackers that contain trans fat. Vegetables Creamed or fried vegetables. Vegetables in a cheese sauce. Regular canned vegetables. Regular canned tomato sauce and paste. Regular tomato and vegetable juices. Fruits Dried fruits. Canned fruit in light or heavy syrup. Fruit juice. Meat and Other Protein Products Fatty cuts of meat. Ribs, chicken wings, bacon, sausage, bologna, salami, chitterlings, fatback, hot dogs, bratwurst, and packaged luncheon meats. Salted nuts and seeds. Canned beans with salt. Dairy Whole or 2% milk, cream, half-and-half, and cream cheese. Whole-fat or sweetened yogurt. Full-fat cheeses or blue cheese. Nondairy creamers and whipped toppings. Processed cheese, cheese spreads, or cheese curds. Condiments Onion and garlic salt, seasoned salt, table salt, and sea salt. Canned and packaged gravies. Worcestershire sauce. Tartar sauce. Barbecue sauce. Teriyaki sauce. Soy sauce, including reduced sodium. Steak sauce. Fish sauce. Oyster sauce. Cocktail sauce. Horseradish. Ketchup and mustard. Meat flavorings and tenderizers. Bouillon cubes. Hot sauce. Tabasco sauce. Marinades. Taco seasonings. Relishes. Fats and Oils Butter, stick margarine, lard, shortening, ghee, and bacon fat. Coconut, palm kernel, or palm oils. Regular salad dressings. Other Pickles and olives. Salted popcorn and pretzels. The items listed above may not be a complete list of foods and beverages to avoid. Contact your dietitian for more information. WHERE CAN I  FIND MORE INFORMATION? National Heart, Lung, and Blood Institute: travelstabloid.com Document Released: 10/09/2011 Document Revised: 03/06/2014 Document Reviewed: 08/24/2013 [facility] Patient Information 2015 [place], [place]. This information is not intended to replace advice given to you by your health care provider. Make sure you discuss any questions you have with your health care provider.

## 2021-08-31 LAB — CBC WITH DIFFERENTIAL/PLATELET
Absolute Monocytes: 504 cells/uL (ref 200–950)
Basophils Absolute: 88 cells/uL (ref 0–200)
Basophils Relative: 1.7 %
Eosinophils Absolute: 593 cells/uL — ABNORMAL HIGH (ref 15–500)
Eosinophils Relative: 11.4 %
HCT: 43.5 % (ref 38.5–50.0)
Hemoglobin: 14.4 g/dL (ref 13.2–17.1)
Lymphs Abs: 1388 cells/uL (ref 850–3900)
MCH: 30 pg (ref 27.0–33.0)
MCHC: 33.1 g/dL (ref 32.0–36.0)
MCV: 90.6 fL (ref 80.0–100.0)
MPV: 11 fL (ref 7.5–12.5)
Monocytes Relative: 9.7 %
Neutro Abs: 2626 cells/uL (ref 1500–7800)
Neutrophils Relative %: 50.5 %
Platelets: 189 10*3/uL (ref 140–400)
RBC: 4.8 10*6/uL (ref 4.20–5.80)
RDW: 13.5 % (ref 11.0–15.0)
Total Lymphocyte: 26.7 %
WBC: 5.2 10*3/uL (ref 3.8–10.8)

## 2021-08-31 LAB — HEMOGLOBIN A1C
Hgb A1c MFr Bld: 5.7 % of total Hgb — ABNORMAL HIGH (ref <5.7)
Mean Plasma Glucose: 117 mg/dL
eAG (mmol/L): 6.5 mmol/L

## 2021-08-31 LAB — LIPID PANEL
Cholesterol: 165 mg/dL (ref <200)
HDL: 64 mg/dL (ref >=40)
LDL Cholesterol (Calc): 82 mg/dL (calc)
Non-HDL Cholesterol (Calc): 101 mg/dL (calc) (ref <130)
Total CHOL/HDL Ratio: 2.6 (calc) (ref <5.0)
Triglycerides: 91 mg/dL (ref <150)

## 2021-08-31 LAB — COMPLETE METABOLIC PANEL WITH GFR
AG Ratio: 1.4 (calc) (ref 1.0–2.5)
ALT: 25 U/L (ref 9–46)
AST: 25 U/L (ref 10–35)
Albumin: 4.3 g/dL (ref 3.6–5.1)
Alkaline phosphatase (APISO): 100 U/L (ref 35–144)
BUN: 17 mg/dL (ref 7–25)
CO2: 27 mmol/L (ref 20–32)
Calcium: 9.6 mg/dL (ref 8.6–10.3)
Chloride: 103 mmol/L (ref 98–110)
Creat: 1.07 mg/dL (ref 0.70–1.22)
Globulin: 3 g/dL (calc) (ref 1.9–3.7)
Glucose, Bld: 97 mg/dL (ref 65–99)
Potassium: 4 mmol/L (ref 3.5–5.3)
Sodium: 140 mmol/L (ref 135–146)
Total Bilirubin: 0.4 mg/dL (ref 0.2–1.2)
Total Protein: 7.3 g/dL (ref 6.1–8.1)
eGFR: 70 mL/min/{1.73_m2} (ref >=60)

## 2021-08-31 LAB — VITAMIN D 25 HYDROXY (VIT D DEFICIENCY, FRACTURES): Vit D, 25-Hydroxy: 127 ng/mL — ABNORMAL HIGH (ref 30–100)

## 2021-08-31 LAB — MAGNESIUM: Magnesium: 2.4 mg/dL (ref 1.5–2.5)

## 2021-08-31 LAB — TSH: TSH: 0.98 mIU/L (ref 0.40–4.50)

## 2021-10-01 ENCOUNTER — Ambulatory Visit (INDEPENDENT_AMBULATORY_CARE_PROVIDER_SITE_OTHER): Payer: Medicare PPO

## 2021-10-01 ENCOUNTER — Other Ambulatory Visit: Payer: Self-pay

## 2021-10-01 DIAGNOSIS — I10 Essential (primary) hypertension: Secondary | ICD-10-CM | POA: Diagnosis not present

## 2021-10-01 NOTE — Progress Notes (Signed)
Patient presents today for a Nurse Visit to recheck his Blood pressure. He has been taking them at [facility] and they have been 120's-130/70's-80's. He will continue to check them and document them and is aware if they go over 140's to call and get an appointment.

## 2021-12-09 ENCOUNTER — Other Ambulatory Visit: Payer: Self-pay | Admitting: Internal Medicine

## 2022-02-17 ENCOUNTER — Encounter: Payer: Self-pay | Admitting: Internal Medicine

## 2022-02-17 NOTE — Progress Notes (Signed)
? ? ? ?Annual  Screening/Preventative Visit  ?& Comprehensive Evaluation & Examination ? ?Future Appointments  ?Date Time Provider Department  ?02/18/2022               CPE 11:00 AM Unk Pinto, MD [facility]  ?08/22/2022             Wellness 10:00 AM Liane Comber, NP [facility]  ?02/19/2023               CPE 11:00 AM Unk Pinto, MD [facility]  ? ?    ?     This very nice 81 y.o. married 80 man presents for a Screening /Preventative Visit & comprehensive evaluation and management of multiple medical co-morbidities.  Patient has been followed for HTN, HLD, Prediabetes and Vitamin D Deficiency. In 2014, patient had excision of a colon cancer. Patient has hx/o chronic intermittent Allergic  Asthma.  ?  ? ?    HTN predates circa 1996. Patient's BP has been controlled at home.  Today's BP is at goal - 130/78. Patient denies any cardiac symptoms as chest pain, palpitations, shortness of breath, dizziness or ankle swelling. ? ? ?    Patient's hyperlipidemia is controlled with diet and Rosuvastatin. Patient denies myalgias or other medication SE's. Last lipids were at goal : ? ?Lab Results  ?Component Value Date  ? CHOL 165 08/30/2021  ? HDL 64 08/30/2021  ? [place] 82 08/30/2021  ? TRIG 91 08/30/2021  ? CHOLHDL 2.6 08/30/2021  ? ? ? ?    Patient has hx/o prediabetes (A1c 6.3% /2013) and patient denies reactive hypoglycemic symptoms, visual blurring, diabetic polys or paresthesias. Last A1c was near goal : ?  ?Lab Results  ?Component Value Date  ? HGBA1C 5.7 (H) 08/30/2021  ?  ? ? ?    Finally, patient has history of Vitamin D Deficiency ("24" /2008) and last vitamin D was elevated & dose was decreased : ?  ?Lab Results  ?Component Value Date  ? VD25OH 127 (H) 08/30/2021  ? ? ? ?Current Outpatient Medications on File Prior to Visit  ?Medication Sig  ? aspirin EC 81 MG tablet Take  dail  ? cetirizine 10 MG tablet TAKE 1 TABLET DAILY FOR ALLERGIES  ? G ADVAIR DISKUS 250 Inhale 1 puff - morning and bedtime.   ? Folic Acid-Cholecalciferol  Take 2,000 Units daily.  ? COMBIVENT 20-100 respimat Inhale 1 puff every 4 hours as needed   ? losartan 50 MG tablet TAKE 1 TABLET DAILY   ? montelukast 10 MG tablet TAKE 1 TABLET DAILY   ? Multiple Vitamin  Take  daily.  ? rosuvastatin  40 MG tablet TAKE 1 TABLET DAILY   ? verapamil-SR  240 MG CR TAKE 1 TABLET DAILY   ? ? ? ?Allergies  ?Allergen Reactions  ? Mavik [Trandolapril]   ?  Cough  ? Hctz [Hydrochlorothiazide] Rash  ?  Photosensitivity  ? ? ? ?Past Medical History:  ?Diagnosis Date  ? Allergy   ? Asthma   ? BPH (benign prostatic hyperplasia)   ? GERD (gastroesophageal reflux disease)   ? History of colon cancer   ? Hyperlipidemia   ? Hypertension   ? Prediabetes   ? Vitamin D deficiency   ? ? ? ?Health Maintenance  ?Topic Date Due  ? Zoster Vaccines- Shingrix (1 of 2) Never done  ? COVID-19 Vaccine (5 - Booster for [place] series) 10/17/2021  ? INFLUENZA VACCINE  06/03/2022  ? TETANUS/TDAP  06/03/2030  ?  Pneumonia Vaccine  Completed  ? HPV VACCINES  Aged Out  ? ? ? ?Immunization History  ?Administered Date(s) Administered  ? DT  07/10/2009  ? Influenza Split 07/28/2013  ? Influenza, High Dose  07/28/2018, 08/03/2019, 10/08/2020, 08/30/2021  ? PFIZER-SARS-COV-2 Vacc 12/17/2019, 01/08/2020, 09/17/2020, 08/22/2021  ? Pneumococcal -13 02/07/2016  ? Pneumococcal -23 02/22/2007  ? Tdap 06/03/2020  ? ? ?Last Colon - 03/17/2016 - Dr Earlean Shawl   Recc 5 year  f/u May 2022 ( overdue)  ? ? ? ?Past Surgical History:  ?Procedure Laterality Date  ? COLON  Cancer SURGERY  2004  ? Dr Rosana Hoes  ? ? ? ?Family History  ?Problem Relation Age of Onset  ? Heart disease Mother   ? Cancer Father   ? Cancer Sister   ? Autoimmune disease Brother   ? ? ? ?Social History  ? ?Tobacco Use  ? Smoking status: Never  ? Smokeless tobacco: Never  ?Substance Use Topics  ? Alcohol use: No  ? Drug use: No  ? ? ? ? ROS ?Constitutional: Denies fever, chills, weight loss/gain, headaches, insomnia,  night sweats or change in  appetite. Does c/o fatigue. ?Eyes: Denies redness, blurred vision, diplopia, discharge, itchy or watery eyes.  ?ENT: Denies discharge, congestion, post nasal drip, epistaxis, sore throat, earache, hearing loss, dental pain, Tinnitus, Vertigo, Sinus pain or snoring.  ?Cardio: Denies chest pain, palpitations, irregular heartbeat, syncope, dyspnea, diaphoresis, orthopnea, PND, claudication or edema ?Respiratory: denies cough, dyspnea, DOE, pleurisy, hoarseness, laryngitis or wheezing.  ?Gastrointestinal: Denies dysphagia, heartburn, reflux, water brash, pain, cramps, nausea, vomiting, bloating, diarrhea, constipation, hematemesis, melena, hematochezia, jaundice or hemorrhoids ?Genitourinary: Denies dysuria, frequency, urgency, nocturia, hesitancy, discharge, hematuria or flank pain ?Musculoskeletal: Denies arthralgia, myalgia, stiffness, Jt. Swelling, pain, limp or strain/sprain. Denies Falls. ?Skin: Denies puritis, rash, hives, warts, acne, eczema or change in skin lesion ?Neuro: No weakness, tremor, incoordination, spasms, paresthesia or pain ?Psychiatric: Denies confusion, memory loss or sensory loss. Denies Depression. ?Endocrine: Denies change in weight, skin, hair change, nocturia, and paresthesia, diabetic polys, visual blurring or hyper / hypo glycemic episodes.  ?Heme/Lymph: No excessive bleeding, bruising or enlarged lymph nodes. ? ? ?Physical Exam ? ?BP 130/78   Pulse 79   Temp 97.9 ?F (36.6 ?C)   Resp 16   Ht 5' 7.25" (1.708 m)   Wt 166 lb 3.2 oz (75.4 kg)   SpO2 95%   BMI 25.84 kg/m?  ? ?General Appearance: Well nourished and well groomed and in no apparent distress. ? ?Eyes: PERRLA, EOMs, conjunctiva no swelling or erythema, normal fundi and vessels. ?Sinuses: No frontal/maxillary tenderness ?ENT/Mouth: EACs patent / TMs  nl. Nares clear without erythema, swelling, mucoid exudates. Oral hygiene is good. No erythema, swelling, or exudate. Tongue normal, non-obstructing. Tonsils not swollen or  erythematous. Hearing normal.  ?Neck: Supple, thyroid not palpable. No bruits, nodes or JVD. ?Respiratory: Respiratory effort normal.  BS equal  with forced post tussive end expiratory fine wheezes, But no  rales, rhonchi or stridor. ?Cardio: Heart sounds are normal with regular rate and rhythm and no murmurs, rubs or gallops. Peripheral pulses are normal and equal bilaterally without edema. No aortic or femoral bruits. ?Chest: symmetric with normal excursions and percussion.  ?Abdomen: Soft, with Nl bowel sounds. Nontender, no guarding, rebound, hernias, masses, or organomegaly.  ?Lymphatics: Non tender without lymphadenopathy.  ?Musculoskeletal: Full ROM all peripheral extremities, joint stability, 5/5 strength, and normal gait. ?Skin: Warm and dry without rashes, lesions, cyanosis, clubbing or  ecchymosis.  ?Neuro:  Cranial nerves intact, reflexes equal bilaterally. Normal muscle tone, no cerebellar symptoms. Sensation intact.  ?Pysch: Alert and oriented X 3 with normal affect, insight and judgment appropriate.  ? ?Assessment and Plan ? ?1. Annual Preventative/Screening Exam  ? ? ?2. Essential hypertension ? ?- EKG 12-Lead ?- [place], RETROPERITNL ABD,  LTD ?- Urinalysis, Routine w reflex microscopic ?- Microalbumin / creatinine urine ratio ?- CBC with Differential/Platelet ?- COMPLETE METABOLIC PANEL WITH GFR ?- Magnesium ?- TSH ? ?3. Hyperlipidemia, mixed ? ?- EKG 12-Lead ?- [place], RETROPERITNL ABD,  LTD ?- Lipid panel ?- TSH ? ?4. Abnormal glucose ? ?- EKG 12-Lead ?- [place], RETROPERITNL ABD,  LTD ?- Hemoglobin A1c ?- Insulin, random ? ?5. Vitamin D deficiency ? ?- VITAMIN D 25 Hydroxy  ? ?6. BPH with obstruction/lower urinary tract symptoms ? ?- PSA ? ?7. History of colon cancer ? ?- Ambulatory referral to Gastroenterology ? ?8. Prostate cancer screening ? ?- PSA ? ?9. Screening for colorectal cancer ? ?- Ambulatory referral to Gastroenterology ? ?10. Screening for heart disease ? ?- EKG 12-Lead ? ?11. FHx: heart  disease ? ?- EKG 12-Lead ?- [place], RETROPERITNL ABD,  LTD ? ?12. Screening for AAA (aortic abdominal aneurysm) ? ?- [place], RETROPERITNL ABD,  LTD ? ?13. Medication management ? ?- Urinalysis, Routine w reflex microscopic ?- Mi

## 2022-02-17 NOTE — Patient Instructions (Signed)

## 2022-02-18 ENCOUNTER — Ambulatory Visit (INDEPENDENT_AMBULATORY_CARE_PROVIDER_SITE_OTHER): Payer: Medicare PPO | Admitting: Internal Medicine

## 2022-02-18 VITALS — BP 130/78 | HR 79 | Temp 97.9°F | Resp 16 | Ht 67.25 in | Wt 166.2 lb

## 2022-02-18 DIAGNOSIS — E782 Mixed hyperlipidemia: Secondary | ICD-10-CM | POA: Diagnosis not present

## 2022-02-18 DIAGNOSIS — Z8249 Family history of ischemic heart disease and other diseases of the circulatory system: Secondary | ICD-10-CM

## 2022-02-18 DIAGNOSIS — N401 Enlarged prostate with lower urinary tract symptoms: Secondary | ICD-10-CM | POA: Diagnosis not present

## 2022-02-18 DIAGNOSIS — Z0000 Encounter for general adult medical examination without abnormal findings: Secondary | ICD-10-CM

## 2022-02-18 DIAGNOSIS — I700 Atherosclerosis of aorta: Secondary | ICD-10-CM

## 2022-02-18 DIAGNOSIS — Z136 Encounter for screening for cardiovascular disorders: Secondary | ICD-10-CM | POA: Diagnosis not present

## 2022-02-18 DIAGNOSIS — Z1211 Encounter for screening for malignant neoplasm of colon: Secondary | ICD-10-CM

## 2022-02-18 DIAGNOSIS — E559 Vitamin D deficiency, unspecified: Secondary | ICD-10-CM

## 2022-02-18 DIAGNOSIS — I10 Essential (primary) hypertension: Secondary | ICD-10-CM | POA: Diagnosis not present

## 2022-02-18 DIAGNOSIS — R7309 Other abnormal glucose: Secondary | ICD-10-CM | POA: Diagnosis not present

## 2022-02-18 DIAGNOSIS — Z125 Encounter for screening for malignant neoplasm of prostate: Secondary | ICD-10-CM

## 2022-02-18 DIAGNOSIS — Z85038 Personal history of other malignant neoplasm of large intestine: Secondary | ICD-10-CM

## 2022-02-18 DIAGNOSIS — Z79899 Other long term (current) drug therapy: Secondary | ICD-10-CM

## 2022-02-18 DIAGNOSIS — N138 Other obstructive and reflux uropathy: Secondary | ICD-10-CM

## 2022-02-18 DIAGNOSIS — J4531 Mild persistent asthma with (acute) exacerbation: Secondary | ICD-10-CM

## 2022-02-18 DIAGNOSIS — Z0001 Encounter for general adult medical examination with abnormal findings: Secondary | ICD-10-CM

## 2022-02-18 MED ORDER — AZITHROMYCIN 250 MG PO TABS: ORAL_TABLET | ORAL | 1 refills | Status: DC

## 2022-02-18 MED ORDER — DEXAMETHASONE 4 MG PO TABS: ORAL_TABLET | ORAL | 0 refills | Status: DC

## 2022-02-19 LAB — COMPLETE METABOLIC PANEL WITH GFR
AG Ratio: 1.4 (calc) (ref 1.0–2.5)
ALT: 26 U/L (ref 9–46)
AST: 25 U/L (ref 10–35)
Albumin: 4.2 g/dL (ref 3.6–5.1)
Alkaline phosphatase (APISO): 94 U/L (ref 35–144)
BUN: 16 mg/dL (ref 7–25)
CO2: 31 mmol/L (ref 20–32)
Calcium: 9.4 mg/dL (ref 8.6–10.3)
Chloride: 101 mmol/L (ref 98–110)
Creat: 1.08 mg/dL (ref 0.70–1.22)
Globulin: 3.1 g/dL (calc) (ref 1.9–3.7)
Glucose, Bld: 92 mg/dL (ref 65–99)
Potassium: 4.3 mmol/L (ref 3.5–5.3)
Sodium: 139 mmol/L (ref 135–146)
Total Bilirubin: 0.5 mg/dL (ref 0.2–1.2)
Total Protein: 7.3 g/dL (ref 6.1–8.1)
eGFR: 69 mL/min/{1.73_m2} (ref >=60)

## 2022-02-19 LAB — URINALYSIS, ROUTINE W REFLEX MICROSCOPIC
Bilirubin Urine: NEGATIVE
Glucose, UA: NEGATIVE
Hgb urine dipstick: NEGATIVE
Ketones, ur: NEGATIVE
Leukocytes,Ua: NEGATIVE
Nitrite: NEGATIVE
Protein, ur: NEGATIVE
Specific Gravity, Urine: 1.006 (ref 1.001–1.035)
pH: 6.5 (ref 5.0–8.0)

## 2022-02-19 LAB — CBC WITH DIFFERENTIAL/PLATELET
Absolute Monocytes: 568 cells/uL (ref 200–950)
Basophils Absolute: 128 cells/uL (ref 0–200)
Basophils Relative: 2.2 %
Eosinophils Absolute: 713 cells/uL — ABNORMAL HIGH (ref 15–500)
Eosinophils Relative: 12.3 %
HCT: 45.2 % (ref 38.5–50.0)
Hemoglobin: 14.4 g/dL (ref 13.2–17.1)
Lymphs Abs: 1560 cells/uL (ref 850–3900)
MCH: 29.1 pg (ref 27.0–33.0)
MCHC: 31.9 g/dL — ABNORMAL LOW (ref 32.0–36.0)
MCV: 91.5 fL (ref 80.0–100.0)
MPV: 11 fL (ref 7.5–12.5)
Monocytes Relative: 9.8 %
Neutro Abs: 2830 cells/uL (ref 1500–7800)
Neutrophils Relative %: 48.8 %
Platelets: 211 10*3/uL (ref 140–400)
RBC: 4.94 10*6/uL (ref 4.20–5.80)
RDW: 13.5 % (ref 11.0–15.0)
Total Lymphocyte: 26.9 %
WBC: 5.8 10*3/uL (ref 3.8–10.8)

## 2022-02-19 LAB — VITAMIN D 25 HYDROXY (VIT D DEFICIENCY, FRACTURES): Vit D, 25-Hydroxy: 97 ng/mL (ref 30–100)

## 2022-02-19 LAB — MICROALBUMIN / CREATININE URINE RATIO
Creatinine, Urine: 27 mg/dL (ref 20–320)
Microalb, Ur: <0.2 mg/dL

## 2022-02-19 LAB — HEMOGLOBIN A1C
Hgb A1c MFr Bld: 5.8 % of total Hgb — ABNORMAL HIGH (ref <5.7)
Mean Plasma Glucose: 120 mg/dL
eAG (mmol/L): 6.6 mmol/L

## 2022-02-19 LAB — LIPID PANEL
Cholesterol: 175 mg/dL (ref <200)
HDL: 65 mg/dL (ref >=40)
LDL Cholesterol (Calc): 86 mg/dL (calc)
Non-HDL Cholesterol (Calc): 110 mg/dL (calc) (ref <130)
Total CHOL/HDL Ratio: 2.7 (calc) (ref <5.0)
Triglycerides: 147 mg/dL (ref <150)

## 2022-02-19 LAB — MAGNESIUM: Magnesium: 2.3 mg/dL (ref 1.5–2.5)

## 2022-02-19 LAB — PSA: PSA: 0.31 ng/mL (ref <=4.00)

## 2022-02-19 LAB — INSULIN, RANDOM: Insulin: 6.5 u[IU]/mL

## 2022-02-19 LAB — TSH: TSH: 1.94 mIU/L (ref 0.40–4.50)

## 2022-02-20 NOTE — Progress Notes (Signed)
<><><><><><><><><><><><><><><><><><><><><><><><><><><><><><><><><> ?<><><><><><><><><><><><><><><><><><><><><><><><><><><><><><><><><> ?-   Test results slightly outside the reference range are not unusual. ?If there is anything important, I will review this with you,  ?otherwise it is considered normal test values.  ?If you have further questions,  ?please do not hesitate to contact me at the office or via My Chart.  ?<><><><><><><><><><><><><><><><><><><><><><><><><><><><><><><><><> ?<><><><><><><><><><><><><><><><><><><><><><><><><><><><><><><><><> ? ?-  A1c = 5.8% - Blood sugar and A1c are STILL elevated in the borderline and  ?                                                              early or pre-diabetes range which has the same  ? ?300% increased risk for heart attack, stroke, cancer and  ?                                                alzheimer- type vascular dementia as full blown diabetes.  ? ?But the good news is that diet, exercise with weight loss can  ?                                                                                  cure the early diabetes at this point. ?<><><><><><><><><><><><><><><><><><><><><><><><><><><><><><><><><> ?<><><><><><><><><><><><><><><><><><><><><><><><><><><><><><><><><> ? ?-  PSA - Very Low - Great  ! ?<><><><><><><><><><><><><><><><><><><><><><><><><><><><><><><><><> ?<><><><><><><><><><><><><><><><><><><><><><><><><><><><><><><><><> ? ?-  Total Chol = 175    &   LD:L Chol = 86   - Both    Excellent  ? ?- Very low risk for Heart Attack  / Stroke ?<><><><><><><><><><><><><><><><><><><><><><><><><><><><><><><><><> ?<><><><><><><><><><><><><><><><><><><><><><><><><><><><><><><><><> ? ?-  Vitamin D = 97 - Excellent   - Please keep dose same  ? <><><><><><><><><><><><><><><><><><><><><><><><><><><><><><><><><> ?<><><><><><><><><><><><><><><><><><><><><><><><><><><><><><><><><> ? ?-  All Else - CBC - Kidneys - Electrolytes - Liver - Magnesium & Thyroid   ? ?- all   Normal / OK ?<><><><><><><><><><><><><><><><><><><><><><><><><><><><><><><><><> ?<><><><><><><><><><><><><><><><><><><><><><><><><><><><><><><><><> ? ?- Keep up the [place] Work  ! ?<><><><><><><><><><><><><><><><><><><><><><><><><><><><><><><><><> ?<><><><><><><><><><><><><><><><><><><><><><><><><><><><><><><><><> ? ? ? ? ? ? ? ? ? ? ? ? ? ? ? ? ? ? ? ? ? ? ? ? ? ? ? ? ? ? ? ? ?

## 2022-03-17 ENCOUNTER — Other Ambulatory Visit: Payer: Self-pay | Admitting: Adult Health

## 2022-05-09 ENCOUNTER — Encounter: Payer: Self-pay | Admitting: Gastroenterology

## 2022-05-26 ENCOUNTER — Other Ambulatory Visit: Payer: Self-pay | Admitting: Internal Medicine

## 2022-05-26 ENCOUNTER — Other Ambulatory Visit: Payer: Self-pay

## 2022-05-26 MED ORDER — LOSARTAN POTASSIUM 50 MG PO TABS: 50.0000 mg | ORAL_TABLET | Freq: Every day | ORAL | 3 refills | Status: DC

## 2022-06-12 ENCOUNTER — Telehealth: Payer: Self-pay

## 2022-06-12 ENCOUNTER — Ambulatory Visit (INDEPENDENT_AMBULATORY_CARE_PROVIDER_SITE_OTHER): Payer: Medicare PPO | Admitting: Gastroenterology

## 2022-06-12 ENCOUNTER — Encounter: Payer: Self-pay | Admitting: Gastroenterology

## 2022-06-12 VITALS — BP 138/80 | HR 75 | Ht 68.0 in | Wt 169.1 lb

## 2022-06-12 DIAGNOSIS — Z85038 Personal history of other malignant neoplasm of large intestine: Secondary | ICD-10-CM | POA: Diagnosis not present

## 2022-06-12 DIAGNOSIS — Z08 Encounter for follow-up examination after completed treatment for malignant neoplasm: Secondary | ICD-10-CM

## 2022-06-12 MED ORDER — CLENPIQ 10-3.5-12 MG-GM -GM/175ML PO SOLN: 1.0000 | Freq: Once | ORAL | 0 refills | Status: AC

## 2022-06-12 NOTE — Progress Notes (Signed)
Chief Complaint: Colon cancer surveillance   Referring Provider:     Unk Pinto, MD    HPI:     Jon [name] is a 81 y.o. male retired [employer] with a history of HTN, hyperlipidemia, BPH, asthma, referred to the [facility]ology [facility] for evaluation of colon cancer surveillance.  Previously followed with Dr. Earlean Shawl.  History of colon cancer s/p resection, chemotherapy, and radiation 2004.  Limited available records as below:  -04/04/2003: Partial colectomy and appendectomy with path demonstrating moderately differentiated rectal adenocarcinoma with deep invasion of muscularis propria and metastasis of 1/8 perirectal lymph nodes - 03/17/2011: Colonoscopy (Dr. Earlean Shawl): No report available for review, but pathology report reviewed (mucosal prolapse polyp) and recommended repeat in 5 years  - 03/17/2016: Colonoscopy (Dr. Earlean Shawl): Pandiverticulosis, internal hemorrhoids.  Repeat in 5 years  Last abdominal imaging was: - 10/22/2006: CT abdomen/pelvis: No evidence of disease recurrence or metastasis.  Several tiny hepatic cysts unchanged from previous.  Cholelithiasis without cholecystitis.  Otherwise no recent GI sxs. Does stool cards at home. Last CEA was 05/2008 and normal/negative.  Maintains active lifestyle.  Cholelithiasis 81 mg, no antiplatelets and anticoagulation therapy.      Latest Ref Rng & Units 02/18/2022   10:58 AM 08/30/2021   11:31 AM 02/14/2021   10:01 AM  CBC  WBC 3.8 - 10.8 Thousand/uL 5.8  5.2  5.2   Hemoglobin 13.2 - 17.1 g/dL 14.4  14.4  14.4   Hematocrit 38.5 - 50.0 % 45.2  43.5  44.8   Platelets 140 - 400 Thousand/uL 211  189  194       Latest Ref Rng & Units 02/18/2022   10:58 AM 08/30/2021   11:31 AM 02/14/2021   10:01 AM  CMP  Glucose 65 - 99 mg/dL 92  97  88   BUN 7 - 25 mg/dL '16  17  15   '$ Creatinine 0.70 - 1.22 mg/dL 1.08  1.07  0.99   Sodium 135 - 146 mmol/L 139  140  142   Potassium 3.5 - 5.3 mmol/L 4.3  4.0  4.1   Chloride 98 -  110 mmol/L 101  103  104   CO2 20 - 32 mmol/L '31  27  27   '$ Calcium 8.6 - 10.3 mg/dL 9.4  9.6  9.8   Total Protein 6.1 - 8.1 g/dL 7.3  7.3  7.5   Total Bilirubin 0.2 - 1.2 mg/dL 0.5  0.4  0.5   AST 10 - 35 U/L '25  25  22   '$ ALT 9 - 46 U/L '26  25  18      '$ Past Medical History:  Diagnosis Date   Allergy    Asthma    BPH (benign prostatic hyperplasia)    GERD (gastroesophageal reflux disease)    History of colon cancer    Hyperlipidemia    Hypertension    Prediabetes    Vitamin D deficiency      Past Surgical History:  Procedure Laterality Date   COLON SURGERY  2004   Dr Rosana Hoes   Family History  Problem Relation Age of Onset   Heart disease Mother    Cancer Father    Cancer Sister    Autoimmune disease Brother    Social History   Tobacco Use   Smoking status: Never   Smokeless tobacco: Never  Substance Use Topics   Alcohol use: No   Drug use: No  Current Outpatient Medications  Medication Sig Dispense Refill   aspirin EC 81 MG tablet Take 81 mg by mouth daily. Swallow whole.     azithromycin (ZITHROMAX) 250 MG tablet Take 2 tablets with Food on  Day 1, then 1 tablet Daily with Food for Sinusitis / Bronchitis 6 each 1   cetirizine (ZYRTEC) 10 MG tablet TAKE 1 TABLET DAILY FOR ALLERGIES 90 tablet 3   dexamethasone (DECADRON) 4 MG tablet Take 1 tab 3 x day - 3 days, then 2 x day - 3 days, then 1 tab daily 20 tablet 0   Folic Acid-Cholecalciferol (CHOLECAL DF PO) Take 2,000 Units by mouth daily.     Ipratropium-Albuterol (COMBIVENT RESPIMAT) 20-100 MCG/ACT AERS respimat Inhale 1 puff into the lungs every 4 (four) hours as needed for wheezing. 4 g 4   losartan (COZAAR) 50 MG tablet Take 1 tablet (50 mg total) by mouth daily. for blood pressure 90 tablet 3   montelukast (SINGULAIR) 10 MG tablet TAKE 1 TABLET DAILY FOR ALLERGY AND ASTHMA 90 tablet 3   Multiple Vitamin (MULTIVITAMIN PO) Take by mouth daily.     rosuvastatin (CRESTOR) 40 MG tablet TAKE 1 TABLET DAILY FOR  CHOLESTEROL 90 tablet 3   verapamil (CALAN-SR) 240 MG CR tablet TAKE 1 TABLET DAILY WITH FOOD 90 tablet 3   WIXELA INHUB 250-50 MCG/ACT AEPB USE 1 INHALATION IN THE MORNING AND AT BEDTIME 180 each 3   No current facility-administered medications for this visit.   Allergies  Allergen Reactions   Mavik [Trandolapril]     Cough   Hctz [Hydrochlorothiazide] Rash    Photosensitivity     Review of Systems: All systems reviewed and negative except where noted in HPI.     Physical Exam:    Wt Readings from Last 3 Encounters:  02/18/22 166 lb 3.2 oz (75.4 kg)  10/01/21 165 lb (74.8 kg)  08/30/21 166 lb (75.3 kg)    There were no vitals taken for this visit. Constitutional:  Pleasant, in no acute distress. Psychiatric: Normal mood and affect. Behavior is normal. Cardiovascular: Normal rate, regular rhythm. No edema Pulmonary/chest: Effort normal and breath sounds normal. No wheezing, rales or rhonchi. Abdominal: Well-healed midline incision.  Soft, nondistended, nontender. Bowel sounds active throughout.  Neurological: Alert and oriented to person place and time. Skin: Skin is warm and dry. No rashes noted.   ASSESSMENT AND PLAN;   1) Colon cancer surveillance History of colon cancer s/p surgical resection, chemotherapy, radiation in 2004.  Last CEA check at 5 years was normal/negative, and last imaging was 2007 and no evidence of recurrence/metastatic disease.  Otherwise no recent active GI symptoms. - Due for repeat colonoscopy for ongoing surveillance - Scheduled colonoscopy  2) Hypertension 3) Hyperlipidemia - Ok to continue ASA 81 mg in the perioperative period  The indications, risks, and benefits of colonoscopy were explained to the patient in detail. Risks include but are not limited to bleeding, perforation, adverse reaction to medications, and cardiopulmonary compromise. Sequelae include but are not limited to the possibility of surgery, hospitalization, and  mortality. The patient verbalized understanding and wished to proceed. All questions answered, referred to the scheduler and bowel prep ordered. Further recommendations pending results of the exam.     [name], DO, FACG  06/12/2022, 2:07 PM   Unk Pinto, MD

## 2022-06-12 NOTE — Telephone Encounter (Signed)
Opened by error.

## 2022-06-12 NOTE — Patient Instructions (Signed)
_______________________________________________________  If you are age 81 or older, your body mass index should be between 23-30. Your Body mass index is 25.72 kg/m. If this is out of the aforementioned range listed, please consider follow up with your Primary Care Provider.  ________________________________________________________  The [place] providers would like to encourage you to use [facility] to communicate with providers for non-urgent requests or questions.  Due to long hold times on the telephone, sending your provider a message by [facility] may be a faster and more efficient way to get a response.  Please allow 48 business hours for a response.  Please remember that this is for non-urgent requests.  _______________________________________________________   Due to recent changes in healthcare laws, you may see the results of your imaging and laboratory studies on MyChart before your provider has had a chance to review them.  We understand that in some cases there may be results that are confusing or concerning to you. Not all laboratory results come back in the same time frame and the provider may be waiting for multiple results in order to interpret others.  Please give [place] 48 hours in order for your provider to thoroughly review all the results before contacting the office for clarification of your results.   You have been scheduled for a colonoscopy. Please follow written instructions given to you at your visit today.  Please pick up your prep supplies at the pharmacy within the next 1-3 days. If you use inhalers (even only as needed), please bring them with you on the day of your procedure.   We have sent the following medications to your pharmacy for you to pick up at your convenience: [email or web address]   Thank you for choosing me and [facility].  Vito Cirigliano, D.O.

## 2022-07-04 ENCOUNTER — Encounter: Payer: Self-pay | Admitting: Gastroenterology

## 2022-07-04 ENCOUNTER — Ambulatory Visit (AMBULATORY_SURGERY_CENTER): Payer: Medicare PPO | Admitting: Gastroenterology

## 2022-07-04 VITALS — BP 127/94 | HR 83 | Temp 98.0°F | Resp 13 | Ht 68.0 in | Wt 169.0 lb

## 2022-07-04 DIAGNOSIS — D124 Benign neoplasm of descending colon: Secondary | ICD-10-CM

## 2022-07-04 DIAGNOSIS — K5730 Diverticulosis of large intestine without perforation or abscess without bleeding: Secondary | ICD-10-CM

## 2022-07-04 DIAGNOSIS — Z85038 Personal history of other malignant neoplasm of large intestine: Secondary | ICD-10-CM | POA: Diagnosis not present

## 2022-07-04 DIAGNOSIS — R7303 Prediabetes: Secondary | ICD-10-CM | POA: Diagnosis not present

## 2022-07-04 DIAGNOSIS — D120 Benign neoplasm of cecum: Secondary | ICD-10-CM | POA: Diagnosis not present

## 2022-07-04 DIAGNOSIS — K640 First degree hemorrhoids: Secondary | ICD-10-CM | POA: Diagnosis not present

## 2022-07-04 DIAGNOSIS — Z08 Encounter for follow-up examination after completed treatment for malignant neoplasm: Secondary | ICD-10-CM | POA: Diagnosis not present

## 2022-07-04 DIAGNOSIS — I10 Essential (primary) hypertension: Secondary | ICD-10-CM | POA: Diagnosis not present

## 2022-07-04 MED ORDER — SODIUM CHLORIDE 0.9 % IV SOLN: 500.0000 mL | Freq: Once | INTRAVENOUS | Status: DC

## 2022-07-04 NOTE — Progress Notes (Signed)
A and O x3. Report to RN. Tolerated MAC anesthesia well. 

## 2022-07-04 NOTE — Progress Notes (Signed)
GASTROENTEROLOGY PROCEDURE H&P NOTE   Primary Care Physician: Unk Pinto, MD    Reason for Procedure:   Colon cancer surveillance  Plan:    Colonoscopy  Patient is appropriate for endoscopic procedure(s) in the ambulatory ([facility]) setting.  The nature of the procedure, as well as the risks, benefits, and alternatives were carefully and thoroughly reviewed with the patient. Ample time for discussion and questions allowed. The patient understood, was satisfied, and agreed to proceed.     HPI: Jon [name] is a 81 y.o. male who presents for colonsocopy for evaluation of colon cancer surveillance .  Patient was most recently seen in the Gastroenterology Clinic on 06/12/2022.  No interval change in medical history since that appointment. Please refer to that note for full details regarding GI history and clinical presentation.   -04/04/2003: Partial colectomy and appendectomy with path demonstrating moderately differentiated rectal adenocarcinoma with deep invasion of muscularis propria and metastasis of 1/8 perirectal lymph nodes - 03/17/2011: Colonoscopy (Dr. Earlean Shawl): No report available for review, but pathology report reviewed (mucosal prolapse polyp) and recommended repeat in 5 years  - 03/17/2016: Colonoscopy (Dr. Earlean Shawl): Pandiverticulosis, internal hemorrhoids.  Repeat in 5 years  Past Medical History:  Diagnosis Date   Allergy    Asthma    BPH (benign prostatic hyperplasia)    GERD (gastroesophageal reflux disease)    History of colon cancer    Hyperlipidemia    Hypertension    Prediabetes    Vitamin D deficiency     Past Surgical History:  Procedure Laterality Date   COLON SURGERY  2004   Dr Rosana Hoes    Prior to Admission medications   Medication Sig Start Date End Date Taking? Authorizing Provider  aspirin EC 81 MG tablet Take 81 mg by mouth daily. Swallow whole.   Yes [provider]  cetirizine (ZYRTEC) 10 MG tablet TAKE 1 TABLET DAILY FOR ALLERGIES  05/26/22  Yes Cranford, Kenney Houseman, NP  Folic Acid-Cholecalciferol (CHOLECAL DF PO) Take 2,000 Units by mouth daily.   Yes [provider]  Ipratropium-Albuterol (COMBIVENT RESPIMAT) 20-100 MCG/ACT AERS respimat Inhale 1 puff into the lungs every 4 (four) hours as needed for wheezing. 05/03/21  Yes Liane Comber, NP  losartan (COZAAR) 50 MG tablet Take 1 tablet (50 mg total) by mouth daily. for blood pressure 05/26/22  Yes Unk Pinto, MD  montelukast (SINGULAIR) 10 MG tablet TAKE 1 TABLET DAILY FOR ALLERGY AND ASTHMA 03/17/22  Yes Alycia Rossetti, NP  Multiple Vitamin (MULTIVITAMIN PO) Take by mouth daily.   Yes [provider]  rosuvastatin (CRESTOR) 40 MG tablet TAKE 1 TABLET DAILY FOR CHOLESTEROL 08/06/21  Yes Alycia Rossetti, NP  verapamil (CALAN-SR) 240 MG CR tablet TAKE 1 TABLET DAILY WITH FOOD 12/09/21  Yes Alycia Rossetti, NP  Grant Ruts INHUB 250-50 MCG/ACT AEPB USE 1 INHALATION IN THE MORNING AND AT BEDTIME 05/26/22  Yes Cranford, Kenney Houseman, NP    Current Outpatient Medications  Medication Sig Dispense Refill   aspirin EC 81 MG tablet Take 81 mg by mouth daily. Swallow whole.     cetirizine (ZYRTEC) 10 MG tablet TAKE 1 TABLET DAILY FOR ALLERGIES 90 tablet 3   Folic Acid-Cholecalciferol (CHOLECAL DF PO) Take 2,000 Units by mouth daily.     Ipratropium-Albuterol (COMBIVENT RESPIMAT) 20-100 MCG/ACT AERS respimat Inhale 1 puff into the lungs every 4 (four) hours as needed for wheezing. 4 g 4   losartan (COZAAR) 50 MG tablet Take 1 tablet (50 mg total) by mouth  daily. for blood pressure 90 tablet 3   montelukast (SINGULAIR) 10 MG tablet TAKE 1 TABLET DAILY FOR ALLERGY AND ASTHMA 90 tablet 3   Multiple Vitamin (MULTIVITAMIN PO) Take by mouth daily.     rosuvastatin (CRESTOR) 40 MG tablet TAKE 1 TABLET DAILY FOR CHOLESTEROL 90 tablet 3   verapamil (CALAN-SR) 240 MG CR tablet TAKE 1 TABLET DAILY WITH FOOD 90 tablet 3   WIXELA INHUB 250-50 MCG/ACT AEPB USE 1 INHALATION IN THE MORNING  AND AT BEDTIME 180 each 3   Current Facility-Administered Medications  Medication Dose Route Frequency Provider Last Rate Last Admin   0.9 %  sodium chloride infusion  500 mL Intravenous Once Mason Dibiasio V, DO        Allergies as of 07/04/2022 - Review Complete 07/04/2022  Allergen Reaction Noted   Mavik [trandolapril]  11/05/2013   Hctz [hydrochlorothiazide] Rash 11/05/2013    Family History  Problem Relation Age of Onset   Heart disease Mother    Cancer Father    Cancer Sister    Breast cancer Sister    Autoimmune disease Brother    Crohn's disease Daughter    Colon cancer Cousin        Second cousin on dad side   Esophageal cancer Neg Hx    Rectal cancer Neg Hx    Stomach cancer Neg Hx     Social History   Socioeconomic History   Marital status: Married    Spouse name: Not on file   Number of children: Not on file   Years of education: Not on file   Highest education level: Not on file  Occupational History   Not on file  Tobacco Use   Smoking status: Never   Smokeless tobacco: Never  Vaping Use   Vaping Use: Never used  Substance and Sexual Activity   Alcohol use: Yes    Comment: wine with dinner   Drug use: No   Sexual activity: Not on file  Other Topics Concern   Not on file  Social History Narrative   Not on file   Social Determinants of Health   Financial Resource Strain: Not on file  Food Insecurity: Not on file  Transportation Needs: Not on file  Physical Activity: Not on file  Stress: Not on file  Social Connections: Not on file  Intimate Partner Violence: Not on file    Physical Exam: Vital signs in last 24 hours: '@BP'$  (!) 147/73   Pulse 88   Temp 98 F (36.7 C)   Ht '5\' 8"'$  (1.727 m)   Wt 169 lb (76.7 kg)   SpO2 96%   BMI 25.70 kg/m  GEN: NAD EYE: Sclerae anicteric ENT: MMM CV: Non-tachycardic Pulm: CTA b/l GI: Soft, NT/ND NEURO:  Alert & Oriented x Finley, DO [facility]   07/04/2022 10:47  AM

## 2022-07-04 NOTE — Op Note (Signed)
Jon [name] Patient Name: Abdimalik Mayorquin Procedure Date: 07/04/2022 10:44 AM MRN: [id number] Endoscopist: Gerrit Heck , MD Age: 81 Referring MD:  Date of Birth: 30-Dec-1940 Gender: Male Account #: [id number] Procedure:                Colonoscopy Indications:              High risk colon cancer surveillance: Personal                            history of colon cancer                           ?" 04/04/2003: Partial colectomy and appendectomy with                            path demonstrating moderately differentiated rectal                            adenocarcinoma with deep invasion of muscularis                            propria and metastasis of 1/8 perirectal lymph nodes                           ?" 03/17/2011: Colonoscopy (Dr. Earlean Shawl): No report                            available for review, butpathology report reviewed                            (mucosal prolapse polyp) andrecommended repeat in                            5 years                           ?" 03/17/2016: Colonoscopy (Dr. Earlean Shawl):                            Pandiverticulosis, internal hemorrhoids. Repeat in                            5 years Medicines:                Monitored Anesthesia Care Procedure:                Pre-Anesthesia Assessment:                           - Prior to the procedure, a History and Physical                            was performed, and patient medications and                            allergies were reviewed. The patient's tolerance of  previous anesthesia was also reviewed. The risks                            and benefits of the procedure and the sedation                            options and risks were discussed with the patient.                            All questions were answered, and informed consent                            was obtained. Prior Anticoagulants: The patient has                            taken no previous  anticoagulant or antiplatelet                            agents. ASA Grade Assessment: II - A patient with                            mild systemic disease. After reviewing the risks                            and benefits, the patient was deemed in                            satisfactory condition to undergo the procedure.                           After obtaining informed consent, the colonoscope                            was passed under direct vision. Throughout the                            procedure, the patient's blood pressure, pulse, and                            oxygen saturations were monitored continuously. The                            CF [id number] #[id number] was introduced through the anus                            and advanced to the the terminal ileum. The                            colonoscopy was performed without difficulty. The                            patient tolerated the procedure well. The quality  of the bowel preparation was good. The terminal                            ileum, ileocecal valve, appendiceal orifice, and                            rectum were photographed. Scope In: 10:54:18 AM Scope Out: 11:15:36 AM Scope Withdrawal Time: 0 hours 15 minutes 31 seconds  Total Procedure Duration: 0 hours 21 minutes 18 seconds  Findings:                 The perianal and digital rectal examinations were                            normal.                           A 4 mm polyp was found in the cecum. The polyp was                            sessile. The polyp was removed with a cold snare.                            Resection and retrieval were complete. Estimated                            blood loss was minimal.                           A 5 mm polyp was found in the descending colon. The                            polyp was sessile. The polyp was removed with a                            cold snare. Resection and retrieval were complete.                             Estimated blood loss was minimal.                           Many small and large-mouthed diverticula were found                            in the entire colon.                           There was evidence of a prior end-to-end                            colo-colonic anastomosis in the recto-sigmoid                            colon. This was patent and was characterized by  healthy appearing mucosa.                           The terminal ileum appeared normal.                           Non-bleeding internal hemorrhoids were found during                            retroflexion. The hemorrhoids were small. Complications:            No immediate complications. Estimated Blood Loss:     Estimated blood loss was minimal. Impression:               - One 4 mm polyp in the cecum, removed with a cold                            snare. Resected and retrieved.                           - One 5 mm polyp in the descending colon, removed                            with a cold snare. Resected and retrieved.                           - Diverticulosis in the entire examined colon.                           - Patent end-to-end colo-colonic anastomosis,                            characterized by healthy appearing mucosa.                           - The examined portion of the ileum was normal.                           - Non-bleeding internal hemorrhoids. Recommendation:           - Patient has a contact number available for                            emergencies. The signs and symptoms of potential                            delayed complications were discussed with the                            patient. Return to normal activities tomorrow.                            Written discharge instructions were provided to the                            patient.                           -  Resume previous diet.                           - Continue present  medications.                           - Await pathology results.                           - Depending on pathology, overall health, etc, can                            consider repeat colonoscopy in 5 years for                            continued surveillance as clinically appropriate.                           - Return to GI clinic PRN. Gerrit Heck, MD 07/04/2022 11:23:18 AM

## 2022-07-04 NOTE — Patient Instructions (Signed)
Read all of the handouts given to you by your recovery room nurse.  YOU HAD AN ENDOSCOPIC PROCEDURE TODAY AT [facility]:   Refer to the procedure report that was given to you for any specific questions about what was found during the examination.  If the procedure report does not answer your questions, please call your gastroenterologist to clarify.  If you requested that your care partner not be given the details of your procedure findings, then the procedure report has been included in a sealed envelope for you to review at your convenience later.  YOU SHOULD EXPECT: Some feelings of bloating in the abdomen. Passage of more gas than usual.  Walking can help get rid of the air that was put into your GI tract during the procedure and reduce the bloating. If you had a lower endoscopy (such as a colonoscopy or flexible sigmoidoscopy) you may notice spotting of blood in your stool or on the toilet paper. If you underwent a bowel prep for your procedure, you may not have a normal bowel movement for a few days.  Please Note:  You might notice some irritation and congestion in your nose or some drainage.  This is from the oxygen used during your procedure.  There is no need for concern and it should clear up in a day or so.  SYMPTOMS TO REPORT IMMEDIATELY:  Following lower endoscopy (colonoscopy or flexible sigmoidoscopy):  Excessive amounts of blood in the stool  Significant tenderness or worsening of abdominal pains  Swelling of the abdomen that is new, acute  Fever of 100F or higher   For urgent or emergent issues, a gastroenterologist can be reached at any hour by calling [phone]. Do not use MyChart messaging for urgent concerns.    DIET:  We do recommend a small meal at first, but then you may proceed to your regular diet.  Drink plenty of fluids but you should avoid alcoholic beverages for 24 hours. Try to increase the fiber in your diet, and drink plenty of  water.  ACTIVITY:  You should plan to take it easy for the rest of today and you should NOT DRIVE or use heavy machinery until tomorrow (because of the sedation medicines used during the test).    FOLLOW UP: Our staff will call the number listed on your records the next business day following your procedure.  We will call around 7:15- 8:00 am to check on you and address any questions or concerns that you may have regarding the information given to you following your procedure. If we do not reach you, we will leave a message.  If you develop any symptoms (ie: fever, flu-like symptoms, shortness of breath, cough etc.) before then, please call [phone].  If you test positive for Covid 19 in the 2 weeks post procedure, please call and report this information to [place].    If any biopsies were taken you will be contacted by phone or by letter within the next 1-3 weeks.  Please call us at [phone] if you have not heard about the biopsies in 3 weeks.    SIGNATURES/CONFIDENTIALITY: You and/or your care partner have signed paperwork which will be entered into your electronic medical record.  These signatures attest to the fact that that the information above on your After Visit Summary has been reviewed and is understood.  Full responsibility of the confidentiality of this discharge information lies with you and/or your care-partner.

## 2022-07-08 ENCOUNTER — Telehealth: Payer: Self-pay

## 2022-07-08 NOTE — Telephone Encounter (Signed)
  Follow up Call-     07/04/2022   10:08 AM  Call back number  Post procedure Call Back phone  # [phone]  Permission to leave phone message Yes    Post op call attempted, no answer, left WM.

## 2022-07-11 ENCOUNTER — Encounter: Payer: Self-pay | Admitting: Gastroenterology

## 2022-07-22 DIAGNOSIS — D123 Benign neoplasm of transverse colon: Secondary | ICD-10-CM | POA: Diagnosis not present

## 2022-07-22 DIAGNOSIS — D127 Benign neoplasm of rectosigmoid junction: Secondary | ICD-10-CM | POA: Diagnosis not present

## 2022-08-01 ENCOUNTER — Other Ambulatory Visit: Payer: Self-pay | Admitting: Nurse Practitioner

## 2022-08-01 DIAGNOSIS — E782 Mixed hyperlipidemia: Secondary | ICD-10-CM

## 2022-08-22 ENCOUNTER — Ambulatory Visit: Payer: Medicare PPO | Admitting: Adult Health

## 2022-08-25 ENCOUNTER — Ambulatory Visit (INDEPENDENT_AMBULATORY_CARE_PROVIDER_SITE_OTHER): Payer: Medicare PPO | Admitting: Nurse Practitioner

## 2022-08-25 ENCOUNTER — Encounter: Payer: Self-pay | Admitting: Nurse Practitioner

## 2022-08-25 VITALS — BP 190/80 | HR 72 | Temp 97.1°F | Ht 68.0 in | Wt 170.0 lb

## 2022-08-25 DIAGNOSIS — K219 Gastro-esophageal reflux disease without esophagitis: Secondary | ICD-10-CM

## 2022-08-25 DIAGNOSIS — J4489 Other specified chronic obstructive pulmonary disease: Secondary | ICD-10-CM | POA: Diagnosis not present

## 2022-08-25 DIAGNOSIS — I10 Essential (primary) hypertension: Secondary | ICD-10-CM | POA: Diagnosis not present

## 2022-08-25 DIAGNOSIS — E782 Mixed hyperlipidemia: Secondary | ICD-10-CM

## 2022-08-25 DIAGNOSIS — Z85038 Personal history of other malignant neoplasm of large intestine: Secondary | ICD-10-CM

## 2022-08-25 DIAGNOSIS — R7303 Prediabetes: Secondary | ICD-10-CM | POA: Diagnosis not present

## 2022-08-25 DIAGNOSIS — E663 Overweight: Secondary | ICD-10-CM

## 2022-08-25 DIAGNOSIS — E559 Vitamin D deficiency, unspecified: Secondary | ICD-10-CM

## 2022-08-25 DIAGNOSIS — Z0001 Encounter for general adult medical examination with abnormal findings: Secondary | ICD-10-CM

## 2022-08-25 DIAGNOSIS — Z23 Encounter for immunization: Secondary | ICD-10-CM

## 2022-08-25 DIAGNOSIS — Z79899 Other long term (current) drug therapy: Secondary | ICD-10-CM

## 2022-08-25 DIAGNOSIS — M170 Bilateral primary osteoarthritis of knee: Secondary | ICD-10-CM

## 2022-08-25 DIAGNOSIS — R6889 Other general symptoms and signs: Secondary | ICD-10-CM

## 2022-08-25 DIAGNOSIS — R002 Palpitations: Secondary | ICD-10-CM

## 2022-08-25 DIAGNOSIS — Z0000 Encounter for general adult medical examination without abnormal findings: Secondary | ICD-10-CM

## 2022-08-25 NOTE — Progress Notes (Signed)
MEDICARE ANNUAL WELLNESS VISIT AND FOLLOW UP Assessment:    Annual Medicare Wellness Visit Due annually  Health maintenance reviewed  Essential hypertension Irregular rhythm - EKG reveals atrial fibrillation Elevated today - has not take morning BP medication. Discussed DASH (Dietary Approaches to Stop Hypertension) DASH diet is lower in sodium than a typical American diet. Cut back on foods that are high in saturated fat, cholesterol, and trans fats. Eat more whole-grain foods, fish, poultry, and nuts Remain active and exercise as tolerated daily.  Monitor BP at home-Call if greater than 130/80.  Check and monitor CMP/CBC   Mixed hyperlipidemia Discussed lifestyle modifications. Recommended diet heavy in fruits and veggies, omega 3's. Decrease consumption of animal meats, cheeses, and dairy products. Remain active and exercise as tolerated. Continue to monitor. Check and monitor lipids/TSH   Prediabetes Education: Reviewed 'ABCs' of diabetes management  Discussed goals to be met and/or maintained include A1C (<7) Blood pressure (<130/80) Cholesterol (LDL <70) Continue Eye Exam yearly  Continue Dental Exam Q6 mo Discussed dietary recommendations Discussed Physical Activity recommendations Check and monitor A1C   Vitamin D deficiency Continue to recommend supplementation for goal of 60-100 Defer vitamin D level due to insurance/cost to next visit   Medication management All medications discussed and reviewed in full. All questions and concerns regarding medications addressed.    Uncomplicated asthma, unspecified asthma severity, unspecified whether persistent Controlled on inhalers/montelukast Avoid triggers  Gastroesophageal reflux disease, esophagitis presence not specified Recent colonoscopy 07/2022 No suspected reflux complications (Barret/stricture). Lifestyle modification:  wt loss, avoid meals 2-3h before bedtime. Consider eliminating food triggers:   chocolate, caffeine, EtOH, acid/spicy food.  Benign prostatic hyperplasia with nocturia Continue to monitor PSA at CPE  History of colon cancer Colonoscopy completed 07/2022 Recall 5 years  Overweight - BMI 25 Discussed appropriate BMI Goal of losing 1 lb per month. Diet modification. Physical activity. Encouraged/praised to build confidence.   Osteoarthritis of bilateral knees Followed by Dr. Sharol Given getting injection Continue to remain active - swimming Stay well hydrated Tylenol PRN  Palpitations EKG: NSR with PVC Holter Monitor 14 Days Report to ER for any increase in stroke like symptoms, including HA, N/V, paralysis, difficulty speaking, trouble walking, confusion, vision changes, CP, heart palpitations, SOB, diaphoresis.  Orders Placed This Encounter  Procedures   CBC with Differential/Platelet   COMPLETE METABOLIC PANEL WITH GFR   Hemoglobin A1c   LONG TERM MONITOR (3-14 DAYS)    14 Days    Standing Status:   Future    Standing Expiration Date:   08/26/2023    Order Specific Question:   Where should this test be performed?    Answer:   [facility]    Order Specific Question:   Does the patient have an implanted cardiac device?    Answer:   No    Order Specific Question:   Prescribed days of wear    Answer:   48    Order Specific Question:   Type of enrollment    Answer:   Clinic Enrollment   EKG 12-Lead    Notify office for further evaluation and treatment, questions or concerns if any reported s/s fail to improve.   The patient was advised to call back or seek an in-person evaluation if any symptoms worsen or if the condition fails to improve as anticipated.   Further disposition pending results of labs. Discussed med's effects and SE's.    I discussed the assessment and treatment plan with the patient. The patient was provided  an opportunity to ask questions and all were answered. The patient agreed with the plan and demonstrated an understanding of the  instructions.  Discussed med's effects and SE's. Screening labs and tests as requested with regular follow-up as recommended.  I provided 30 minutes of face-to-face time during this encounter including counseling, chart review, and critical decision making was preformed.   Future Appointments  Date Time Provider [place]  02/19/2023 11:00 AM Unk Pinto, MD [facility] None  05/21/2023  9:00 AM Darrol Jump, NP [facility] None     Plan:   During the course of the visit the patient was educated and counseled about appropriate screening and preventive services including:   Pneumococcal vaccine  Prevnar 13 Influenza vaccine Td vaccine Screening electrocardiogram Bone densitometry screening Colorectal cancer screening Diabetes screening Glaucoma screening Nutrition counseling  Advanced directives: requested   Subjective:  Jon [name] is a 81 y.o. male who presents for Medicare Annual Wellness Visit and 3 month follow up for HTN, hyperlipidemia, prediabetes, and vitamin D Def.   He completed colonoscopy 07/2022.  Since then he has not been "feeling well."  Admits to not staying well hydrated.  Has had some "chest thumping" and chest pain.  Feels heart fluttering.  Associated fatigue.    He had covid 19 early October, recovered without sequela after steroid taper.   He has a history of colon cancer in 2004, up to date on colonoscopy.   Asthma well controlled on inhalers and singulair   He is followed by Dr. Sharol Given for R knee pain and getting knee injections which are helpful, walking and swimming has been helpful.   BMI is Body mass index is 25.85 kg/m., he has been working on diet and exercise, swims at walks intermittently as knee allows.  Wt Readings from Last 3 Encounters:  08/25/22 170 lb (77.1 kg)  07/04/22 169 lb (76.7 kg)  06/12/22 169 lb 2 oz (76.7 kg)   His blood pressure is typically well controlled (110s-120s/70s), has cuff but hasn't been  checking, today their BP is BP: (!) 190/100, similar on recheck. Admits very stressful week and has been eating out in the evening, lots of fast food.   He does workout, no longer running due to knees, swimming 3 days a week, walking as able.  He denies chest pain, shortness of breath, dizziness.  He is on cholesterol medication (crestor 40 mg) and denies myalgias. His cholesterol is at goal. The cholesterol last visit was:   Lab Results  Component Value Date   CHOL 175 02/18/2022   HDL 65 02/18/2022   LDLCALC 86 02/18/2022   TRIG 147 02/18/2022   CHOLHDL 2.7 02/18/2022   He has been working on diet and exercise for prediabetes but admits he likes bread and sweets, has cut down potatoes and , and denies paresthesia of the feet, polydipsia and polyuria. Last A1C in the office was:  Lab Results  Component Value Date   HGBA1C 5.8 (H) 02/18/2022    Last GFR:  Lab Results  Component Value Date   GFRNONAA 72 02/14/2021   Patient is on Vitamin D supplement and above goal, has reduced from 10000 IU to 2000 IU daily   Lab Results  Component Value Date   VD25OH 97 02/18/2022     Medication Review: Current Outpatient Medications on File Prior to Visit  Medication Sig Dispense Refill   aspirin EC 81 MG tablet Take 81 mg by mouth daily. Swallow whole.     cetirizine (ZYRTEC)  10 MG tablet TAKE 1 TABLET DAILY FOR ALLERGIES 90 tablet 3   Folic Acid-Cholecalciferol (CHOLECAL DF PO) Take 2,000 Units by mouth daily.     Ipratropium-Albuterol (COMBIVENT RESPIMAT) 20-100 MCG/ACT AERS respimat Inhale 1 puff into the lungs every 4 (four) hours as needed for wheezing. 4 g 4   losartan (COZAAR) 50 MG tablet Take 1 tablet (50 mg total) by mouth daily. for blood pressure 90 tablet 3   montelukast (SINGULAIR) 10 MG tablet TAKE 1 TABLET DAILY FOR ALLERGY AND ASTHMA 90 tablet 3   Multiple Vitamin (MULTIVITAMIN PO) Take by mouth daily.     rosuvastatin (CRESTOR) 40 MG tablet TAKE 1 TABLET DAILY FOR  CHOLESTEROL 90 tablet 3   verapamil (CALAN-SR) 240 MG CR tablet TAKE 1 TABLET DAILY WITH FOOD 90 tablet 3   WIXELA INHUB 250-50 MCG/ACT AEPB USE 1 INHALATION IN THE MORNING AND AT BEDTIME 180 each 3   No current facility-administered medications on file prior to visit.    Current Problems (verified) Patient Active Problem List   Diagnosis Date Noted   Overweight (BMI 25.0-29.9) 04/22/2018   Osteoarthritis of knees, bilateral 06/24/2017   Medication management 02/16/2014   Hyperlipidemia    Hypertension    Vitamin D deficiency    Prediabetes    COPD with asthma    GERD (gastroesophageal reflux disease)    History of colon cancer    BPH (benign prostatic hyperplasia)     Screening Tests Immunization History  Administered Date(s) Administered   DT (Pediatric) 07/10/2009   Influenza Split 07/28/2013   Influenza, High Dose Seasonal PF 09/18/2014, 07/26/2015, 08/18/2016, 07/13/2017, 07/28/2018, 08/03/2019, 10/08/2020, 08/30/2021   PFIZER(Purple Top)SARS-COV-2 Vaccination 12/17/2019, 01/08/2020, 09/17/2020, 08/22/2021   Pneumococcal Conjugate-13 02/07/2016   Pneumococcal Polysaccharide-23 02/22/2007   Tdap 06/03/2020   Preventative care: Last colonoscopy:  07/2022, Dr. Earlean Shawl, repeat colonoscopy 5 years  Prior vaccinations: TD or Tdap: 06/2020 at [facility] Influenza: Due - defer d/t palpitations Pneumococcal: 2008 Prevnar 13: 2017  Shingles/Zostavax: declines Covid 19: 2/2, 2021, pfizer + boosters - dates requested  Names of Other Physician/Practitioners you currently use: 1. [facility] here for primary care 2. [employer], dentist, next appointment in 2022 3.  Dr. Bing [facility], 2022  Patient Care Team: Unk Pinto, MD as PCP - General (Internal Medicine) Calvert Cantor, MD as Consulting Physician (Ophthalmology) Richmond Campbell, MD as Consulting Physician (Gastroenterology) Ladell Pier, MD as Consulting Physician  (Oncology)  History reviewed: allergies, current medications, past family history, past medical history, past social history, past surgical history and problem list  Allergies Allergies  Allergen Reactions   Mavik [Trandolapril]     Cough   Hctz [Hydrochlorothiazide] Rash    Photosensitivity    SURGICAL HISTORY He  has a past surgical history that includes Colon surgery (2004). FAMILY HISTORY His family history includes Autoimmune disease in his brother; Breast cancer in his sister; Cancer in his father and sister; Colon cancer in his cousin; Crohn's disease in his daughter; Heart disease in his mother. SOCIAL HISTORY He  reports that he has never smoked. He has never used smokeless tobacco. He reports current alcohol use. He reports that he does not use drugs.   MEDICARE WELLNESS OBJECTIVES: Physical activity:   Cardiac risk factors:   Depression/mood screen:      08/25/2022   10:09 AM  Depression screen PHQ 2/9  Decreased Interest 0  Down, Depressed, Hopeless 0  PHQ - 2 Score 0    ADLs:  08/25/2022   10:09 AM 02/17/2022   10:53 PM  In your present state of health, do you have any difficulty performing the following activities:  Hearing? 0 0  Vision? 0 0  Difficulty concentrating or making decisions? 0 0  Walking or climbing stairs? 0 0  Dressing or bathing? 0 0  Doing errands, shopping? 0 0  Preparing Food and eating ? N   Using the Toilet? N   In the past six months, have you accidently leaked urine? N   Managing your Medications? N   Managing your Finances? N   Housekeeping or managing your Housekeeping? N      Cognitive Testing  Alert? Yes  Normal Appearance?Yes  Oriented to person? Yes  Place? Yes   Time? Yes  Recall of three objects?  Yes  Can perform simple calculations? Yes  Displays appropriate judgment?Yes  Can read the correct time from a watch face?Yes  EOL planning: Does Patient Have a Medical Advance Directive?: Yes Type of Advance  Directive: Living will   Objective:   Blood pressure (!) 190/100, pulse 72, temperature (!) 97.1 F (36.2 C), height $RemoveBe'5\' 8"'RyexsDVgB$  (1.727 m), weight 170 lb (77.1 kg), SpO2 99 %. Body mass index is 25.85 kg/m.  General appearance: alert, no distress, WD/WN, male HEENT: normocephalic, sclerae anicteric, TMs pearly, nares patent, no discharge or erythema, pharynx normal Oral cavity: MMM, no lesions Neck: supple, no lymphadenopathy, no thyromegaly, no masses Heart: Irregular, normal S1, S2, no murmurs Lungs: CTA bilaterally, no wheezes, rhonchi, or rales Abdomen: +bs, soft, non tender, non distended, no masses, no hepatomegaly, no splenomegaly Musculoskeletal: nontender, no swelling, no obvious deformity.  Extremities: no edema, no cyanosis, no clubbing Pulses: 2+ symmetric, upper and lower extremities, normal cap refill Neurological: alert, oriented x 3, CN2-12 intact, strength normal upper extremities and lower extremities, sensation normal throughout, DTRs 2+ throughout, no cerebellar signs, gait normal Psychiatric: normal affect, behavior normal, pleasant   EKG:  NSR with PVC  Medicare Attestation I have personally reviewed: The patient's medical and social history Their use of alcohol, tobacco or illicit drugs Their current medications and supplements The patient's functional ability including ADLs,fall risks, home safety risks, cognitive, and hearing and visual impairment Diet and physical activities Evidence for depression or mood disorders  The patient's weight, height, BMI, and visual acuity have been recorded in the chart.  I have made referrals, counseling, and provided education to the patient based on review of the above and I have provided the patient with a written personalized care plan for preventive services.     Darrol Jump, NP   08/25/2022

## 2022-08-26 ENCOUNTER — Ambulatory Visit (INDEPENDENT_AMBULATORY_CARE_PROVIDER_SITE_OTHER): Payer: Medicare PPO

## 2022-08-26 ENCOUNTER — Ambulatory Visit: Payer: Medicare PPO | Attending: Nurse Practitioner

## 2022-08-26 VITALS — Temp 97.5°F

## 2022-08-26 DIAGNOSIS — R002 Palpitations: Secondary | ICD-10-CM

## 2022-08-26 DIAGNOSIS — Z23 Encounter for immunization: Secondary | ICD-10-CM

## 2022-08-26 LAB — CBC WITH DIFFERENTIAL/PLATELET
Absolute Monocytes: 649 cells/uL (ref 200–950)
Basophils Absolute: 131 cells/uL (ref 0–200)
Basophils Relative: 1.9 %
Eosinophils Absolute: 780 cells/uL — ABNORMAL HIGH (ref 15–500)
Eosinophils Relative: 11.3 %
HCT: 44.5 % (ref 38.5–50.0)
Hemoglobin: 14.9 g/dL (ref 13.2–17.1)
Lymphs Abs: 1635 cells/uL (ref 850–3900)
MCH: 29.9 pg (ref 27.0–33.0)
MCHC: 33.5 g/dL (ref 32.0–36.0)
MCV: 89.4 fL (ref 80.0–100.0)
MPV: 10.9 fL (ref 7.5–12.5)
Monocytes Relative: 9.4 %
Neutro Abs: 3705 cells/uL (ref 1500–7800)
Neutrophils Relative %: 53.7 %
Platelets: 213 10*3/uL (ref 140–400)
RBC: 4.98 10*6/uL (ref 4.20–5.80)
RDW: 13.3 % (ref 11.0–15.0)
Total Lymphocyte: 23.7 %
WBC: 6.9 10*3/uL (ref 3.8–10.8)

## 2022-08-26 LAB — HEMOGLOBIN A1C
Hgb A1c MFr Bld: 6 % of total Hgb — ABNORMAL HIGH (ref <5.7)
Mean Plasma Glucose: 126 mg/dL
eAG (mmol/L): 7 mmol/L

## 2022-08-26 LAB — COMPLETE METABOLIC PANEL WITH GFR
AG Ratio: 1.3 (calc) (ref 1.0–2.5)
ALT: 24 U/L (ref 9–46)
AST: 24 U/L (ref 10–35)
Albumin: 4.3 g/dL (ref 3.6–5.1)
Alkaline phosphatase (APISO): 110 U/L (ref 35–144)
BUN: 20 mg/dL (ref 7–25)
CO2: 29 mmol/L (ref 20–32)
Calcium: 9.3 mg/dL (ref 8.6–10.3)
Chloride: 103 mmol/L (ref 98–110)
Creat: 1.04 mg/dL (ref 0.70–1.22)
Globulin: 3.3 g/dL (calc) (ref 1.9–3.7)
Glucose, Bld: 91 mg/dL (ref 65–99)
Potassium: 4.3 mmol/L (ref 3.5–5.3)
Sodium: 141 mmol/L (ref 135–146)
Total Bilirubin: 0.4 mg/dL (ref 0.2–1.2)
Total Protein: 7.6 g/dL (ref 6.1–8.1)
eGFR: 72 mL/min/{1.73_m2} (ref >=60)

## 2022-08-26 NOTE — Progress Notes (Unsigned)
Enrolled for [employer] to mail a ZIO XT long term holter monitor to the patients address on file.   DOD to read. 

## 2022-10-08 DIAGNOSIS — H43813 Vitreous degeneration, bilateral: Secondary | ICD-10-CM | POA: Diagnosis not present

## 2022-10-08 DIAGNOSIS — H524 Presbyopia: Secondary | ICD-10-CM | POA: Diagnosis not present

## 2022-10-08 DIAGNOSIS — H353111 Nonexudative age-related macular degeneration, right eye, early dry stage: Secondary | ICD-10-CM | POA: Diagnosis not present

## 2022-10-08 DIAGNOSIS — H2513 Age-related nuclear cataract, bilateral: Secondary | ICD-10-CM | POA: Diagnosis not present

## 2022-10-08 DIAGNOSIS — H04123 Dry eye syndrome of bilateral lacrimal glands: Secondary | ICD-10-CM | POA: Diagnosis not present

## 2022-12-08 ENCOUNTER — Ambulatory Visit (INDEPENDENT_AMBULATORY_CARE_PROVIDER_SITE_OTHER): Payer: Medicare PPO | Admitting: Internal Medicine

## 2022-12-08 ENCOUNTER — Encounter: Payer: Self-pay | Admitting: Internal Medicine

## 2022-12-08 ENCOUNTER — Ambulatory Visit: Payer: Medicare PPO | Admitting: Nurse Practitioner

## 2022-12-08 VITALS — BP 168/90 | HR 76 | Temp 97.5°F | Ht 68.0 in | Wt 171.8 lb

## 2022-12-08 DIAGNOSIS — E782 Mixed hyperlipidemia: Secondary | ICD-10-CM | POA: Diagnosis not present

## 2022-12-08 DIAGNOSIS — B354 Tinea corporis: Secondary | ICD-10-CM | POA: Diagnosis not present

## 2022-12-08 DIAGNOSIS — R7309 Other abnormal glucose: Secondary | ICD-10-CM

## 2022-12-08 DIAGNOSIS — I10 Essential (primary) hypertension: Secondary | ICD-10-CM | POA: Diagnosis not present

## 2022-12-08 DIAGNOSIS — Z79899 Other long term (current) drug therapy: Secondary | ICD-10-CM | POA: Diagnosis not present

## 2022-12-08 DIAGNOSIS — E559 Vitamin D deficiency, unspecified: Secondary | ICD-10-CM

## 2022-12-08 MED ORDER — KETOCONAZOLE 2 % EX CREA: 1.0000 | TOPICAL_CREAM | Freq: Every day | CUTANEOUS | 0 refills | Status: DC

## 2022-12-08 MED ORDER — DEXAMETHASONE 4 MG PO TABS: ORAL_TABLET | ORAL | 0 refills | Status: DC

## 2022-12-08 MED ORDER — TERBINAFINE HCL 250 MG PO TABS: ORAL_TABLET | ORAL | 1 refills | Status: DC

## 2022-12-08 MED ORDER — DILTIAZEM HCL ER BEADS 240 MG PO CP24: ORAL_CAPSULE | ORAL | 3 refills | Status: DC

## 2022-12-08 MED ORDER — OLMESARTAN MEDOXOMIL 40 MG PO TABS: ORAL_TABLET | ORAL | 3 refills | Status: DC

## 2022-12-08 NOTE — Progress Notes (Signed)
Future Appointments  Date Time Provider Department  02/19/2023 11:00 AM Unk Pinto, MD [facility]  05/21/2023  9:00 AM Darrol Jump, NP [facility]    History of Present Illness:       This very nice 82 y.o.  married Filipino man who  presents for 3 month follow up with HTN, HLD, Pre-Diabetes and Vitamin D Deficiency. Patient also presents with c/o of a circular rash of his Rt shin. Apparently he's also had some tenderness of his Rt scapular area.         Patient is treated for HTN  since  & BP has been controlled at home. Today's BP is very elevated  -  168/90 . Patient has had no complaints of any cardiac type chest pain, palpitations, dyspnea / orthopnea / PND, dizziness, claudication, or dependent edema.        Hyperlipidemia is controlled with diet & meds. Patient denies myalgias or other med SE's. Last Lipids were atg goal :  Lab Results  Component Value Date   CHOL 175 02/18/2022   HDL 65 02/18/2022   LDLCALC 86 02/18/2022   TRIG 147 02/18/2022   CHOLHDL 2.7 02/18/2022      Also, the patient has history of PreDiabetes (A1c 6.3% /2013) and has had no symptoms of reactive hypoglycemia, diabetic polys, paresthesias or visual blurring.  Last A1c was not at goal :   Lab Results  Component Value Date   HGBA1C 6.0 (H) 08/25/2022         Further, the patient also has history of Vitamin D Deficiency  ("24" /2008)  and supplements vitamin D . Last vitamin D was at goal :  Lab Results  Component Value Date   VD25OH 97 02/18/2022      Current Outpatient Medications on File Prior to Visit  Medication Sig   aspirin EC 81 MG tablet Take daily   cetirizine  10 MG tablet TAKE 1 TABLET DAILY FOR ALLERGIES   Folic Acid-Cholecalciferol  Take 2,000 Units by mouth daily.   COMBIVENT 20-100  respimat Inhale 1 puff i every 4  hours as needed for wheezing.   losartan 50 MG tablet Take 1 tablet  daily   montelukast (10 MG tablet TAKE 1 TABLET DAILY    Multiple Vitamin   Take b daily.   rosuvastatin  40 MG tablet TAKE 1 TABLET DAILY    verapamil -SR240 MG  TAKE 1 TABLET DAILY    WIXELA INHUB 250-50 MCG/ACT AEPB USE 1 INHALATION -MORNING AND BEDTIME     Allergies  Allergen Reactions   Mavik [Trandolapril]     Cough   Hctz [Hydrochlorothiazide] Rash    Photosensitivity     PMHx:   Past Medical History:  Diagnosis Date   Allergy    Asthma    BPH (benign prostatic hyperplasia)    GERD (gastroesophageal reflux disease)    History of colon cancer    Hyperlipidemia    Hypertension    Prediabetes    Vitamin D deficiency       Immunization History  Administered Date(s) Administered   DT (Pediatric) 07/10/2009   Influenza Split 07/28/2013   Influenza, High Dose  08/03/2019, 10/08/2020, 08/30/2021, 08/26/2022   PFIZER - SARS-COV-2 Vacc 12/17/2019, 01/08/2020, 09/17/2020, 08/22/2021   Pneumococcal - 13 02/07/2016   Pneumococcal  - 23 02/22/2007   Tdap 06/03/2020     Past Surgical History:  Procedure Laterality Date   COLON SURGERY  2004   Dr Rosana Hoes  FHx:    Reviewed / unchanged   SHx:    Reviewed / unchanged    Systems Review:  Constitutional: Denies fever, chills, wt changes, headaches, insomnia, fatigue, night sweats, change in appetite. Eyes: Denies redness, blurred vision, diplopia, discharge, itchy, watery eyes.  ENT: Denies discharge, congestion, post nasal drip, epistaxis, sore throat, earache, hearing loss, dental pain, tinnitus, vertigo, sinus pain, snoring.  CV: Denies chest pain, palpitations, irregular heartbeat, syncope, dyspnea, diaphoresis, orthopnea, PND, claudication or edema. Respiratory: denies cough, dyspnea, DOE, pleurisy, hoarseness, laryngitis, wheezing.  Gastrointestinal: Denies dysphagia, odynophagia, heartburn, reflux, water brash, abdominal pain or cramps, nausea, vomiting, bloating, diarrhea, constipation, hematemesis, melena, hematochezia  or hemorrhoids. Genitourinary: Denies dysuria, frequency,  urgency, nocturia, hesitancy, discharge, hematuria or flank pain. Musculoskeletal: Denies arthralgias, myalgias, stiffness, jt. swelling, pain, limping or strain/sprain.  Skin: Denies pruritus, rash, hives, warts, acne, eczema or change in skin lesion(s). Neuro: No weakness, tremor, incoordination, spasms, paresthesia or pain. Psychiatric: Denies confusion, memory loss or sensory loss. Endo: Denies change in weight, skin or hair change.  Heme/Lymph: No excessive bleeding, bruising or enlarged lymph nodes.   Physical Exam  BP (!) 168/90   Pulse 76   Temp (!) 97.5 F (36.4 C)   Ht '5\' 8"'$  (1.727 m)   Wt 171 lb 12.8 oz (77.9 kg)   SpO2 99%   BMI 26.12 kg/m   Appears  well nourished, well groomed  and in no distress.  Eyes: PERRLA, EOMs, conjunctiva no swelling or erythema. Sinuses: No frontal/maxillary tenderness ENT/Mouth: EAC's clear, TM's nl w/o erythema, bulging. Nares clear w/o erythema, swelling, exudates. Oropharynx clear without erythema or exudates. Oral hygiene is good. Tongue normal, non obstructing. Hearing intact.  Neck: Supple. Thyroid not palpable. Car 2+/2+ without bruits, nodes or JVD. Chest: Respirations nl with BS clear & equal w/o rales, rhonchi, wheezing or stridor.  Cor: Heart sounds normal w/ regular rate and rhythm without sig. murmurs, gallops, clicks or rubs. Peripheral pulses normal and equal  without edema.  Abdomen: Soft & bowel sounds normal. Non-tender w/o guarding, rebound, hernias, masses or organomegaly.  Lymphatics: Unremarkable.  Musculoskeletal: Full ROM all peripheral extremities, joint stability, 5/5 strength and normal gait.  Skin: Warm, dry without exposed rashes or ecchymosis apparent. There is a circular 1.5" typical ringworm rash of the Rt medial mid shin.  Neuro: Cranial nerves intact, reflexes equal bilaterally. Sensory-motor testing grossly intact. Tendon reflexes grossly intact.  Pysch: Alert & oriented x 3.  Insight and judgement nl &  appropriate. No ideations.   Assessment and Plan:  1. Essential hypertension  - CBC with Differential/Platelet - COMPLETE METABOLIC PANEL WITH GFR - Magnesium - TSH     - D/C Losartan & Verapamil  & replace with  - olmesartan (BENICAR) 40 MG tablet;  Take 1 tablet every night for BP   Dispense: 90 tablet; Refill: 3  - diltiazem (TIAZAC) 240 MG 24 hr capsule;  Take 1 capsule every Morning for BP   Dispense: 90 capsule; Refill: 3  -  monitor blood pressures  & call remain over 140/90 - Continue DASH diet.  Reminder to go to the ER if any CP,  SOB, nausea, dizziness, severe HA, changes vision/speech.    - Continue diet/meds, exercise,& lifestyle modifications.  - Continue monitor periodic cholesterol/liver & renal functions     2. Hyperlipidemia, mixed  - Lipid panel - TSH  3. Abnormal glucose  - Continue diet, exercise  - Lifestyle modifications.  - Monitor appropriate labs     -  Hemoglobin A1c - Insulin, random  4. Vitamin D deficiency  - Continue supplementation.   - VITAMIN D 25 Hydroxy   5. Ringworm of body  - terbinafine (LAMISIL) 250 MG tablet;  Take 1 tablet Daily for Skin Fungus   Dispense: 15 tablet; Refill: 1  - ketoconazole (NIZORAL) 2 % cream;  Apply to Skin  Fungus / Ringworm   2 to 3 x /day   Dispense: 60 g; Refill: 0  6. Medication management  - CBC with Differential/Platelet - COMPLETE METABOLIC PANEL WITH GFR - Magnesium - Lipid panel - TSH - Hemoglobin A1c - Insulin, random - VITAMIN D 25 Hydroxy          Discussed  regular exercise, BP monitoring, weight control to achieve/maintain BMI less than 25 and discussed med and SE's. Recommended labs to assess /monitor clinical status .  I discussed the assessment and treatment plan with the patient. The patient was provided an opportunity to ask questions and all were answered. The patient agreed with the plan and demonstrated an understanding of the instructions.  I provided over 30  minutes of exam, counseling, chart review and  complex critical decision making.        The patient was advised to call back or seek an in-person evaluation if the symptoms worsen or if the condition fails to improve as anticipated.   Kirtland Bouchard, MD

## 2022-12-08 NOTE — Patient Instructions (Signed)

## 2022-12-09 LAB — CBC WITH DIFFERENTIAL/PLATELET
Absolute Monocytes: 582 cells/uL (ref 200–950)
Basophils Absolute: 132 cells/uL (ref 0–200)
Basophils Relative: 2.2 %
Eosinophils Absolute: 594 cells/uL — ABNORMAL HIGH (ref 15–500)
Eosinophils Relative: 9.9 %
HCT: 43.6 % (ref 38.5–50.0)
Hemoglobin: 14.5 g/dL (ref 13.2–17.1)
Lymphs Abs: 1698 cells/uL (ref 850–3900)
MCH: 29.8 pg (ref 27.0–33.0)
MCHC: 33.3 g/dL (ref 32.0–36.0)
MCV: 89.5 fL (ref 80.0–100.0)
MPV: 10.3 fL (ref 7.5–12.5)
Monocytes Relative: 9.7 %
Neutro Abs: 2994 cells/uL (ref 1500–7800)
Neutrophils Relative %: 49.9 %
Platelets: 215 10*3/uL (ref 140–400)
RBC: 4.87 10*6/uL (ref 4.20–5.80)
RDW: 13.2 % (ref 11.0–15.0)
Total Lymphocyte: 28.3 %
WBC: 6 10*3/uL (ref 3.8–10.8)

## 2022-12-09 LAB — LIPID PANEL
Cholesterol: 154 mg/dL (ref <200)
HDL: 66 mg/dL (ref >=40)
LDL Cholesterol (Calc): 69 mg/dL (calc)
Non-HDL Cholesterol (Calc): 88 mg/dL (calc) (ref <130)
Total CHOL/HDL Ratio: 2.3 (calc) (ref <5.0)
Triglycerides: 108 mg/dL (ref <150)

## 2022-12-09 LAB — TSH: TSH: 1.73 mIU/L (ref 0.40–4.50)

## 2022-12-09 LAB — INSULIN, RANDOM: Insulin: 6.7 u[IU]/mL

## 2022-12-09 LAB — COMPLETE METABOLIC PANEL WITH GFR
AG Ratio: 1.3 (calc) (ref 1.0–2.5)
ALT: 19 U/L (ref 9–46)
AST: 21 U/L (ref 10–35)
Albumin: 4.1 g/dL (ref 3.6–5.1)
Alkaline phosphatase (APISO): 91 U/L (ref 35–144)
BUN: 17 mg/dL (ref 7–25)
CO2: 29 mmol/L (ref 20–32)
Calcium: 9.6 mg/dL (ref 8.6–10.3)
Chloride: 104 mmol/L (ref 98–110)
Creat: 1.01 mg/dL (ref 0.70–1.22)
Globulin: 3.2 g/dL (calc) (ref 1.9–3.7)
Glucose, Bld: 84 mg/dL (ref 65–99)
Potassium: 4.4 mmol/L (ref 3.5–5.3)
Sodium: 143 mmol/L (ref 135–146)
Total Bilirubin: 0.4 mg/dL (ref 0.2–1.2)
Total Protein: 7.3 g/dL (ref 6.1–8.1)
eGFR: 75 mL/min/{1.73_m2} (ref >=60)

## 2022-12-09 LAB — HEMOGLOBIN A1C
Hgb A1c MFr Bld: 6 % of total Hgb — ABNORMAL HIGH (ref <5.7)
Mean Plasma Glucose: 126 mg/dL
eAG (mmol/L): 7 mmol/L

## 2022-12-09 LAB — VITAMIN D 25 HYDROXY (VIT D DEFICIENCY, FRACTURES): Vit D, 25-Hydroxy: 113 ng/mL — ABNORMAL HIGH (ref 30–100)

## 2022-12-09 LAB — MAGNESIUM: Magnesium: 2.4 mg/dL (ref 1.5–2.5)

## 2022-12-09 NOTE — Progress Notes (Signed)
<><><><><><><><><><><><><><><><><><><><><><><><><><><><><><><><><> <><><><><><><><><><><><><><><><><><><><><><><><><><><><><><><><><> -   Test results slightly outside the reference range are not unusual. If there is anything important, I will review this with you,  otherwise it is considered normal test values.  If you have further questions,  please do not hesitate to contact me at the office or via My Chart.  <><><><><><><><><><><><><><><><><><><><><><><><><><><><><><><><><> <><><><><><><><><><><><><><><><><><><><><><><><><><><><><><><><><>  -  A1c = 6.0% - still  in pre or early Diabetic range   (Normal A1c is less than 5.7%)     - So worker harder on diet     - Avoid Sweets, Candy & White Stuff   - White Rice, White Potatoes, White Flour  - Breads &  Pasta <><><><><><><><><><><><><><><><><><><><><><><><><><><><><><><><><> <><><><><><><><><><><><><><><><><><><><><><><><><><><><><><><><><>  -  Vitamin D = 113 is up slightly, So suggest cut back to 1 tablet every day  <><><><><><><><><><><><><><><><><><><><><><><><><><><><><><><><><> <><><><><><><><><><><><><><><><><><><><><><><><><><><><><><><><><>  -  Chol = 154  & LDL = 69    - Both  Excellent   - Very low risk for Heart Attack  / Stroke  - Please continue Crestor same  <><><><><><><><><><><><><><><><><><><><><><><><><><><><><><><><><> <><><><><><><><><><><><><><><><><><><><><><><><><><><><><><><><><>  -  All Else - CBC - Kidneys - Electrolytes - Liver - Magnesium & Thyroid    - all  Normal / OK <><><><><><><><><><><><><><><><><><><><><><><><><><><><><><><><><> <><><><><><><><><><><><><><><><><><><><><><><><><><><><><><><><><>

## 2022-12-14 NOTE — Progress Notes (Signed)
Please send lab letter

## 2023-02-18 ENCOUNTER — Encounter: Payer: Self-pay | Admitting: Internal Medicine

## 2023-02-18 NOTE — Patient Instructions (Signed)

## 2023-02-18 NOTE — Progress Notes (Unsigned)
Annual  Screening/Preventative Visit  & Comprehensive Evaluation & Examination   Future Appointments  Date Time Provider Department  02/19/2023 11:00 AM Jon Cowboy, MD [facility]  05/21/2023  9:00 AM Jon Glimpse, NP [facility]  03/01/2024 11:00 AM Jon Cowboy, MD [facility]            This very nice 82 y.o. married Filipino man presents for a Screening /Preventative Visit & comprehensive evaluation and management of multiple medical co-morbidities.  Patient has been followed for HTN, HLD, Prediabetes and Vitamin D Deficiency. In 2014, patient had excision of a colon cancer. Patient has hx/o chronic intermittent Allergic  Asthma.         Patient also c/o rash R medial proximal shin not resolving with steroid cream & desires to see a Dermatologist.       HTN predates circa 1996. Patient's BP has been controlled at home.  Today's BP was initially slightly elevated & rechecked at goal -  136/78 .  Patient denies any cardiac symptoms as chest pain, palpitations, shortness of breath, dizziness or ankle swelling.       Patient's hyperlipidemia is controlled with diet and Rosuvastatin. Patient denies myalgias or other medication SE's. Last lipids were at goal :  Lab Results  Component Value Date   CHOL 154 12/08/2022   HDL 66 12/08/2022   LDLCALC 69 12/08/2022   TRIG 108 12/08/2022   CHOLHDL 2.3 12/08/2022         Patient has hx/o prediabetes (A1c 6.3% /2013) and patient denies reactive hypoglycemic symptoms, visual blurring, diabetic polys or paresthesias. Last A1c was near goal :   Lab Results  Component Value Date   HGBA1C 6.0 (H) 12/08/2022         Finally, patient has history of Vitamin D Deficiency ("24" /2008) and last vitamin D was elevated & dose was decreased :   Lab Results  Component Value Date   VD25OH 113 (H) 12/08/2022       Current Outpatient Medications on File Prior to Visit  Medication Sig   aspirin EC 81 MG tablet Take  daily    cetirizine 10 MG tablet TAKE 1 TABLET DAILY FOR ALLERGIES   ADVAIR DISKUS 250 Inhale 1 puff - morning and bedtime.   Folic Acid-Cholecalciferol  Take 2,000 Units daily.   COMBIVENT 20-100 respimat Inhale 1 puff every 4 hours as needed    losartan 50 MG tablet TAKE 1 TABLET DAILY    montelukast 10 MG tablet TAKE 1 TABLET DAILY    Multiple Vitamin  Take  daily.   rosuvastatin  40 MG tablet TAKE 1 TABLET DAILY    verapamil-SR  240 MG CR TAKE 1 TABLET DAILY      Allergies  Allergen Reactions   Mavik [Trandolapril]     Cough   Hctz [Hydrochlorothiazide] Rash    Photosensitivity     Past Medical History:  Diagnosis Date   Allergy    Asthma    BPH (benign prostatic hyperplasia)    GERD (gastroesophageal reflux disease)    History of colon cancer    Hyperlipidemia    Hypertension    Prediabetes    Vitamin D deficiency      Health Maintenance  Topic Date Due   Zoster Vaccines- Shingrix (1 of 2) Never done   COVID-19 Vaccine (5 - Booster for Pfizer series) 10/17/2021   INFLUENZA VACCINE  06/03/2022   TETANUS/TDAP  06/03/2030   Pneumonia Vaccine  Completed   HPV VACCINES  Aged Out     Immunization History  Administered Date(s) Administered   DT  07/10/2009   Influenza Split 07/28/2013   Influenza, High Dose  07/28/2018, 08/03/2019, 10/08/2020, 08/30/2021   PFIZER-SARS-COV-2 Vacc 12/17/2019, 01/08/2020, 09/17/2020, 08/22/2021   Pneumococcal -13 02/07/2016   Pneumococcal -23 02/22/2007   Tdap 06/03/2020    Colon - 03/17/2016 - Dr Kinnie Scales   Recc 5 year  f/u May 2022 ( overdue)   Colon - 07/04/2022 - Dr Barron Alvine - Negative - Recc 5 year f/u due Sept 2028  Past Surgical History:  Procedure Laterality Date   COLON  Cancer SURGERY  2004   Dr Earlene Plater     Family History  Problem Relation Age of Onset   Heart disease Mother    Cancer Father    Cancer Sister    Autoimmune disease Brother      Social History   Tobacco Use   Smoking status: Never   Smokeless  tobacco: Never  Substance Use Topics   Alcohol use: No   Drug use: No      ROS Constitutional: Denies fever, chills, weight loss/gain, headaches, insomnia,  night sweats or change in appetite. Does c/o fatigue. Eyes: Denies redness, blurred vision, diplopia, discharge, itchy or watery eyes.  ENT: Denies discharge, congestion, post nasal drip, epistaxis, sore throat, earache, hearing loss, dental pain, Tinnitus, Vertigo, Sinus pain or snoring.  Cardio: Denies chest pain, palpitations, irregular heartbeat, syncope, dyspnea, diaphoresis, orthopnea, PND, claudication or edema Respiratory: denies cough, dyspnea, DOE, pleurisy, hoarseness, laryngitis or wheezing.  Gastrointestinal: Denies dysphagia, heartburn, reflux, water brash, pain, cramps, nausea, vomiting, bloating, diarrhea, constipation, hematemesis, melena, hematochezia, jaundice or hemorrhoids Genitourinary: Denies dysuria, frequency, urgency, nocturia, hesitancy, discharge, hematuria or flank pain Musculoskeletal: Denies arthralgia, myalgia, stiffness, Jt. Swelling, pain, limp or strain/sprain. Denies Falls. Skin: Denies puritis, rash, hives, warts, acne, eczema or change in skin lesion Neuro: No weakness, tremor, incoordination, spasms, paresthesia or pain Psychiatric: Denies confusion, memory loss or sensory loss. Denies Depression. Endocrine: Denies change in weight, skin, hair change, nocturia, and paresthesia, diabetic polys, visual blurring or hyper / hypo glycemic episodes.  Heme/Lymph: No excessive bleeding, bruising or enlarged lymph nodes.   Physical Exam  BP 136/78   Pulse 84   Temp 97.9 F (36.6 C)   Resp 16   Ht 5\' 8"  (1.727 m)   Wt 175 lb (79.4 kg)   SpO2 99%   BMI 26.61 kg/m   General Appearance: Well nourished and well groomed and in no apparent distress.  Eyes: PERRLA, EOMs, conjunctiva no swelling or erythema, normal fundi and vessels. Sinuses: No frontal/maxillary tenderness ENT/Mouth: EACs patent / TMs   nl. Nares clear without erythema, swelling, mucoid exudates. Oral hygiene is good. No erythema, swelling, or exudate. Tongue normal, non-obstructing. Tonsils not swollen or erythematous. Hearing normal.  Neck: Supple, thyroid not palpable. No bruits, nodes or JVD. Respiratory: Respiratory effort normal.  BS equal  with forced post tussive end expiratory fine wheezes, But no  rales, rhonchi or stridor. Cardio: Heart sounds are normal with regular rate and rhythm and no murmurs, rubs or gallops. Peripheral pulses are normal and equal bilaterally without edema. No aortic or femoral bruits. Chest: symmetric with normal excursions and percussion.  Abdomen: Soft, with Nl bowel sounds. Nontender, no guarding, rebound, hernias, masses, or organomegaly.  Lymphatics: Non tender without lymphadenopathy.  Musculoskeletal: Full ROM all peripheral extremities, joint stability, 5/5 strength, and normal gait. Skin: Warm and dry without cyanosis, clubbing or  ecchymosis.  1.5 x 2" circumscribed pink velvety skin lesion of the proximal medial Rt leg/shin.  Neuro: Cranial nerves intact, reflexes equal bilaterally. Normal muscle tone, no cerebellar symptoms. Sensation intact.  Pysch: Alert and oriented x3 with normal affect, insight and judgment appropriate.   Assessment and Plan  1. Annual Preventative/Screening Exam    2. Essential hypertension  - EKG 12-Lead - [place], RETROPERITNL ABD,  LTD - Urinalysis, Routine w reflex microscopic - Microalbumin / creatinine urine ratio - CBC with Differential/Platelet - COMPLETE METABOLIC PANEL WITH GFR - Magnesium - TSH  3. Hyperlipidemia, mixed  - EKG 12-Lead - [place], RETROPERITNL ABD,  LTD - Lipid panel - TSH  4. Abnormal glucose  - EKG 12-Lead - [place], RETROPERITNL ABD,  LTD - Hemoglobin A1c - Insulin, random  5. Vitamin D deficiency  - VITAMIN D 25 Hydroxy   6. BPH with obstruction/lower urinary tract symptoms  - PSA  7. History of colon  cancer  - Ambulatory referral to Gastroenterology  8. Prostate cancer screening  - PSA  9. Screening for colorectal cancer  - Ambulatory referral to Gastroenterology  10. Screening for heart disease  - EKG 12-Lead  11. FHx: heart disease  - EKG 12-Lead - [place], RETROPERITNL ABD,  LTD  12. Screening for AAA (aortic abdominal aneurysm)  - [place], RETROPERITNL ABD,  LTD  13. Medication management  - Urinalysis, Routine w reflex microscopic - Microalbumin / creatinine urine ratio - CBC with Differential/Platelet - COMPLETE METABOLIC PANEL WITH GFR - Magnesium - Lipid panel - TSH - Hemoglobin A1c - Insulin, random - VITAMIN D 25 Hydroxy         Patient was counseled in prudent diet, weight control to achieve/maintain BMI less than 25, BP monitoring, regular exercise and medications as discussed.  Discussed med effects and SE's. Routine screening labs and tests as requested with regular follow-up as recommended. Over 40 minutes of exam, counseling, chart review and high complex critical decision making was performed   Jon Maw, MD

## 2023-02-19 ENCOUNTER — Ambulatory Visit (INDEPENDENT_AMBULATORY_CARE_PROVIDER_SITE_OTHER): Payer: Medicare PPO | Admitting: Internal Medicine

## 2023-02-19 ENCOUNTER — Encounter: Payer: Self-pay | Admitting: Internal Medicine

## 2023-02-19 VITALS — BP 136/78 | HR 84 | Temp 97.9°F | Resp 16 | Ht 68.0 in | Wt 175.0 lb

## 2023-02-19 DIAGNOSIS — J4489 Other specified chronic obstructive pulmonary disease: Secondary | ICD-10-CM

## 2023-02-19 DIAGNOSIS — Z0000 Encounter for general adult medical examination without abnormal findings: Secondary | ICD-10-CM

## 2023-02-19 DIAGNOSIS — Z8249 Family history of ischemic heart disease and other diseases of the circulatory system: Secondary | ICD-10-CM

## 2023-02-19 DIAGNOSIS — E559 Vitamin D deficiency, unspecified: Secondary | ICD-10-CM

## 2023-02-19 DIAGNOSIS — I700 Atherosclerosis of aorta: Secondary | ICD-10-CM | POA: Diagnosis not present

## 2023-02-19 DIAGNOSIS — I10 Essential (primary) hypertension: Secondary | ICD-10-CM | POA: Diagnosis not present

## 2023-02-19 DIAGNOSIS — Z79899 Other long term (current) drug therapy: Secondary | ICD-10-CM | POA: Diagnosis not present

## 2023-02-19 DIAGNOSIS — L989 Disorder of the skin and subcutaneous tissue, unspecified: Secondary | ICD-10-CM

## 2023-02-19 DIAGNOSIS — Z0001 Encounter for general adult medical examination with abnormal findings: Secondary | ICD-10-CM

## 2023-02-19 DIAGNOSIS — R7309 Other abnormal glucose: Secondary | ICD-10-CM

## 2023-02-19 DIAGNOSIS — Z125 Encounter for screening for malignant neoplasm of prostate: Secondary | ICD-10-CM

## 2023-02-19 DIAGNOSIS — N401 Enlarged prostate with lower urinary tract symptoms: Secondary | ICD-10-CM | POA: Diagnosis not present

## 2023-02-19 DIAGNOSIS — N138 Other obstructive and reflux uropathy: Secondary | ICD-10-CM | POA: Diagnosis not present

## 2023-02-19 DIAGNOSIS — Z136 Encounter for screening for cardiovascular disorders: Secondary | ICD-10-CM | POA: Diagnosis not present

## 2023-02-19 DIAGNOSIS — E782 Mixed hyperlipidemia: Secondary | ICD-10-CM

## 2023-02-19 DIAGNOSIS — Z1211 Encounter for screening for malignant neoplasm of colon: Secondary | ICD-10-CM

## 2023-02-19 LAB — PSA: PSA: 0.44 ng/mL (ref <=4.00)

## 2023-02-20 LAB — URINALYSIS, ROUTINE W REFLEX MICROSCOPIC
Bilirubin Urine: NEGATIVE
Glucose, UA: NEGATIVE
Hgb urine dipstick: NEGATIVE
Ketones, ur: NEGATIVE
Leukocytes,Ua: NEGATIVE
Nitrite: NEGATIVE
Protein, ur: NEGATIVE
Specific Gravity, Urine: 1.011 (ref 1.001–1.035)
pH: 7.5 (ref 5.0–8.0)

## 2023-02-20 LAB — MICROALBUMIN / CREATININE URINE RATIO
Creatinine, Urine: 56 mg/dL (ref 20–320)
Microalb, Ur: <0.2 mg/dL

## 2023-02-21 NOTE — Progress Notes (Signed)
^<^<^<^<^<^<^<^<^<^<^<^<^<^<^<^<^<^<^<^<^<^<^<^<^<^<^<^<^<^<^<^<^<^<^<^<^ ^>^>^>^>^>^>^>^>^>^>^>>^>^>^>^>^>^>^>^>^>^>^>^>^>^>^>^>^>^>^>^>^>^>^>^>^ -  Test results slightly outside the reference range are not unusual. If there is anything important, I will review this with you,  otherwise it is considered normal test values.  If you have further questions,  please do not hesitate to contact me at the office or via My Chart.  ^<^<^<^<^<^<^<^<^<^<^<^<^<^<^<^<^<^<^<^<^<^<^<^<^<^<^<^<^<^<^<^<^<^<^<^<^ ^>^>^>^>^>^>^>^>^>^>^>^>^>^>^>^>^>^>^>^>^>^>^>^>^>^>^>^>^>^>^>^>^>^>^>^>^  - U/A - Negative & Normal -->>  OK   - PSA Normal  - No prostate Cancer  - Great  !!  ^>^>^>^>^>^>^>^>^>^>^>^>^>^>^>^>^>^>^>^>^>^>^>^>^>^>^>^>^>^>^>^>^>^>^>^>^ ^>^>^>^>^>^>^>^>^>^>^>^>^>^>^>^>^>^>^>^>^>^>^>^>^>^>^>^>^>^>^>^>^>^>^>^>^

## 2023-04-21 ENCOUNTER — Other Ambulatory Visit: Payer: Self-pay | Admitting: Nurse Practitioner

## 2023-04-21 DIAGNOSIS — J4530 Mild persistent asthma, uncomplicated: Secondary | ICD-10-CM

## 2023-04-21 MED ORDER — COMBIVENT RESPIMAT 20-100 MCG/ACT IN AERS
1.0000 | INHALATION_SPRAY | RESPIRATORY_TRACT | 4 refills | Status: DC | PRN
Start: 2023-04-21 — End: 2024-03-09

## 2023-04-21 NOTE — Addendum Note (Signed)
Addended by: Dionicio Stall on: 04/21/2023 12:14 PM   Modules accepted: Orders

## 2023-05-06 DIAGNOSIS — L439 Lichen planus, unspecified: Secondary | ICD-10-CM | POA: Diagnosis not present

## 2023-05-06 DIAGNOSIS — D485 Neoplasm of uncertain behavior of skin: Secondary | ICD-10-CM | POA: Diagnosis not present

## 2023-05-21 ENCOUNTER — Ambulatory Visit: Payer: Medicare PPO | Admitting: Nurse Practitioner

## 2023-05-28 ENCOUNTER — Ambulatory Visit (INDEPENDENT_AMBULATORY_CARE_PROVIDER_SITE_OTHER): Payer: Medicare PPO | Admitting: Nurse Practitioner

## 2023-05-28 ENCOUNTER — Encounter: Payer: Self-pay | Admitting: Nurse Practitioner

## 2023-05-28 VITALS — BP 128/76 | HR 77 | Temp 97.8°F | Ht 68.0 in | Wt 175.6 lb

## 2023-05-28 DIAGNOSIS — J4531 Mild persistent asthma with (acute) exacerbation: Secondary | ICD-10-CM

## 2023-05-28 DIAGNOSIS — R7309 Other abnormal glucose: Secondary | ICD-10-CM

## 2023-05-28 DIAGNOSIS — R6889 Other general symptoms and signs: Secondary | ICD-10-CM | POA: Diagnosis not present

## 2023-05-28 DIAGNOSIS — R351 Nocturia: Secondary | ICD-10-CM

## 2023-05-28 DIAGNOSIS — K219 Gastro-esophageal reflux disease without esophagitis: Secondary | ICD-10-CM

## 2023-05-28 DIAGNOSIS — E663 Overweight: Secondary | ICD-10-CM | POA: Diagnosis not present

## 2023-05-28 DIAGNOSIS — Z0001 Encounter for general adult medical examination with abnormal findings: Secondary | ICD-10-CM | POA: Diagnosis not present

## 2023-05-28 DIAGNOSIS — E782 Mixed hyperlipidemia: Secondary | ICD-10-CM | POA: Diagnosis not present

## 2023-05-28 DIAGNOSIS — N401 Enlarged prostate with lower urinary tract symptoms: Secondary | ICD-10-CM

## 2023-05-28 DIAGNOSIS — Z0000 Encounter for general adult medical examination without abnormal findings: Secondary | ICD-10-CM

## 2023-05-28 DIAGNOSIS — I10 Essential (primary) hypertension: Secondary | ICD-10-CM | POA: Diagnosis not present

## 2023-05-28 DIAGNOSIS — Z85038 Personal history of other malignant neoplasm of large intestine: Secondary | ICD-10-CM | POA: Diagnosis not present

## 2023-05-28 DIAGNOSIS — Z79899 Other long term (current) drug therapy: Secondary | ICD-10-CM | POA: Diagnosis not present

## 2023-05-28 DIAGNOSIS — R002 Palpitations: Secondary | ICD-10-CM

## 2023-05-28 DIAGNOSIS — E559 Vitamin D deficiency, unspecified: Secondary | ICD-10-CM | POA: Diagnosis not present

## 2023-05-28 DIAGNOSIS — M170 Bilateral primary osteoarthritis of knee: Secondary | ICD-10-CM

## 2023-05-28 LAB — CBC WITH DIFFERENTIAL/PLATELET
Absolute Monocytes: 671 cells/uL (ref 200–950)
Basophils Absolute: 120 cells/uL (ref 0–200)
Basophils Relative: 2.3 %
Eosinophils Absolute: 608 cells/uL — ABNORMAL HIGH (ref 15–500)
Eosinophils Relative: 11.7 %
HCT: 42.1 % (ref 38.5–50.0)
Hemoglobin: 13.8 g/dL (ref 13.2–17.1)
Lymphs Abs: 1550 cells/uL (ref 850–3900)
MCH: 29.4 pg (ref 27.0–33.0)
MCHC: 32.8 g/dL (ref 32.0–36.0)
MCV: 89.8 fL (ref 80.0–100.0)
MPV: 10.8 fL (ref 7.5–12.5)
Monocytes Relative: 12.9 %
Neutro Abs: 2252 cells/uL (ref 1500–7800)
Neutrophils Relative %: 43.3 %
Platelets: 194 10*3/uL (ref 140–400)
RBC: 4.69 10*6/uL (ref 4.20–5.80)
RDW: 13.2 % (ref 11.0–15.0)
Total Lymphocyte: 29.8 %
WBC: 5.2 10*3/uL (ref 3.8–10.8)

## 2023-05-28 NOTE — Patient Instructions (Signed)

## 2023-05-28 NOTE — Progress Notes (Signed)
MEDICARE ANNUAL WELLNESS VISIT AND FOLLOW UP Assessment:   Annual Medicare Wellness Visit Due annually  Health maintenance reviewed Healthily lifestyle goals set  Essential hypertension Discussed DASH (Dietary Approaches to Stop Hypertension) DASH diet is lower in sodium than a typical American diet. Cut back on foods that are high in saturated fat, cholesterol, and trans fats. Eat more whole-grain foods, fish, poultry, and nuts Remain active and exercise as tolerated daily.  Monitor BP at home-Call if greater than 130/80.  Check CMP/CBC  Mixed hyperlipidemia Discussed lifestyle modifications. Recommended diet heavy in fruits and veggies, omega 3's. Decrease consumption of animal meats, cheeses, and dairy products. Remain active and exercise as tolerated. Continue to monitor. Check lipids/TSH  Prediabetes Education: Reviewed 'ABCs' of diabetes management  Discussed goals to be met and/or maintained include A1C (<7) Blood pressure (<130/80) Cholesterol (LDL <70) Continue Eye Exam yearly  Continue Dental Exam Q6 mo Discussed dietary recommendations Discussed Physical Activity recommendations Check A1C  Vitamin D deficiency Continue supplement for goal of 60-100 Monitor Vitamin D levels  Medication management All medications discussed and reviewed in full. All questions and concerns regarding medications addressed.    Uncomplicated asthma, unspecified asthma severity, unspecified whether persistent Controlled on inhalers/montelukast Avoid triggers  Gastroesophageal reflux disease, esophagitis presence not specified Colonoscopy UTD 07/2022 No suspected reflux complications (Barret/stricture). Lifestyle modification:  wt loss, avoid meals 2-3h before bedtime. Consider eliminating food triggers:  chocolate, caffeine, EtOH, acid/spicy food.  Benign prostatic hyperplasia with nocturia Continue to monitor PSA at CPE  History of colon cancer Colonoscopy completed  07/2022 Recall 5 years  Overweight - BMI 25 Discussed appropriate BMI Diet modification. Physical activity. Encouraged/praised to build confidence.  Osteoarthritis of bilateral knees Followed by Dr. Lajoyce Corners getting injection Continue to remain active - swimming Stay well hydrated Tylenol PRN  Palpitations Resolved  Orders Placed This Encounter  Procedures   CBC with Differential/Platelet   COMPLETE METABOLIC PANEL WITH GFR   Lipid panel   Hemoglobin A1C w/out eAG   VITAMIN D 25 Hydroxy (Vit-D Deficiency, Fractures)   Notify office for further evaluation and treatment, questions or concerns if any reported s/s fail to improve.   The patient was advised to call back or seek an in-person evaluation if any symptoms worsen or if the condition fails to improve as anticipated.   Further disposition pending results of labs. Discussed med's effects and SE's.    I discussed the assessment and treatment plan with the patient. The patient was provided an opportunity to ask questions and all were answered. The patient agreed with the plan and demonstrated an understanding of the instructions.  Discussed med's effects and SE's. Screening labs and tests as requested with regular follow-up as recommended.  I provided 35 minutes of face-to-face time during this encounter including counseling, chart review, and critical decision making was preformed.   Future Appointments  Date Time Provider Department Center  03/01/2024 11:00 AM [name], MD [facility] None  05/30/2024  2:30 PM Adela Glimpse, NP [facility] None    Plan:   During the course of the visit the patient was educated and counseled about appropriate screening and preventive services including:   Pneumococcal vaccine  Prevnar 13 Influenza vaccine Td vaccine Screening electrocardiogram Bone densitometry screening Colorectal cancer screening Diabetes screening Glaucoma screening Nutrition counseling  Advanced  directives: requested   Subjective:  Jon [name] is a 82 y.o. male who presents for Medicare Annual Wellness Visit and 3 month follow up for HTN, hyperlipidemia, prediabetes, and vitamin  D Def.   Overall he reports feeling well today.  He has no new concerns at time.  He has a history of colon cancer in 2004, up to date on colonoscopy. Denies any new or recent issues.  Asthma well controlled on inhalers and singulair   He is followed by Dr. Lajoyce Corners for R knee pain and getting knee injections which are helpful, walking and swimming has been helpful.   BMI is Body mass index is 26.7 kg/m., he has been working on diet and exercise, swims at walks intermittently as knee allows.  Wt Readings from Last 3 Encounters:  05/28/23 175 lb 9.6 oz (79.7 kg)  02/19/23 175 lb (79.4 kg)  12/08/22 171 lb 12.8 oz (77.9 kg)   His blood pressure is typically well controlled (110s-120s/70s), has cuff but hasn't been checking, today their BP is BP: 128/76, similar on recheck. Admits very stressful week and has been eating out in the evening, lots of fast food.   He does workout, no longer running due to knees, swimming 3 days a week, walking as able.  He denies chest pain, shortness of breath, dizziness.  He is on cholesterol medication (crestor 40 mg) and denies myalgias. His cholesterol is at goal. The cholesterol last visit was:   Lab Results  Component Value Date   CHOL 154 12/08/2022   HDL 66 12/08/2022   LDLCALC 69 12/08/2022   TRIG 108 12/08/2022   CHOLHDL 2.3 12/08/2022   He has been working on diet and exercise for prediabetes but admits he likes bread and sweets, has cut down potatoes and , and denies paresthesia of the feet, polydipsia and polyuria. Last A1C in the office was:  Lab Results  Component Value Date   HGBA1C 6.0 (H) 12/08/2022    Last GFR:  Lab Results  Component Value Date   GFRNONAA 72 02/14/2021   Patient is on Vitamin D supplement and above goal, has reduced from  10000 IU to 2000 IU daily   Lab Results  Component Value Date   VD25OH 113 (H) 12/08/2022     Medication Review: Current Outpatient Medications on File Prior to Visit  Medication Sig Dispense Refill   aspirin EC 81 MG tablet Take 81 mg by mouth daily. Swallow whole.     cetirizine (ZYRTEC) 10 MG tablet TAKE 1 TABLET DAILY FOR ALLERGIES 90 tablet 3   diltiazem (TIAZAC) 240 MG 24 hr capsule Take 1 capsule every Morning for BP 90 capsule 3   Folic Acid-Cholecalciferol (CHOLECAL DF PO) Take 2,000 Units by mouth daily.     Ipratropium-Albuterol (COMBIVENT RESPIMAT) 20-100 MCG/ACT AERS respimat Inhale 1 puff into the lungs every 4 (four) hours as needed for wheezing. 4 g 4   ketoconazole (NIZORAL) 2 % cream Apply 1 Application topically daily. Apply to Skin  Fungus / Ringworm   2 to 3 x /day 60 g 0   montelukast (SINGULAIR) 10 MG tablet TAKE 1 TABLET DAILY FOR ALLERGY AND ASTHMA 90 tablet 3   Multiple Vitamin (MULTIVITAMIN PO) Take by mouth daily.     olmesartan (BENICAR) 40 MG tablet Take 1 tablet every night for BP 90 tablet 3   rosuvastatin (CRESTOR) 40 MG tablet TAKE 1 TABLET DAILY FOR CHOLESTEROL 90 tablet 3   WIXELA INHUB 250-50 MCG/ACT AEPB USE 1 INHALATION IN THE MORNING AND AT BEDTIME 180 each 3   No current facility-administered medications on file prior to visit.    Current Problems (verified) Patient Active Problem List  Diagnosis Date Noted   Overweight (BMI 25.0-29.9) 04/22/2018   Osteoarthritis of knees, bilateral 06/24/2017   Medication management 02/16/2014   Hyperlipidemia    Hypertension    Vitamin D deficiency    Prediabetes    COPD with asthma    GERD (gastroesophageal reflux disease)    History of colon cancer    BPH (benign prostatic hyperplasia)     Screening Tests Immunization History  Administered Date(s) Administered   DT (Pediatric) 07/10/2009   Influenza Split 07/28/2013   Influenza, High Dose Seasonal PF 09/18/2014, 07/26/2015, 08/18/2016,  07/13/2017, 07/28/2018, 08/03/2019, 10/08/2020, 08/30/2021, 08/26/2022   PFIZER(Purple Top)SARS-COV-2 Vaccination 12/17/2019, 01/08/2020, 09/17/2020, 08/22/2021   Pneumococcal Conjugate-13 02/07/2016   Pneumococcal Polysaccharide-23 02/22/2007   Tdap 06/03/2020   Preventative care: Last colonoscopy:  07/2022, Dr. Kinnie Scales, repeat colonoscopy 5 years  Prior vaccinations: TD or Tdap: [facility] Influenza: Due - defer d/t palpitations Pneumococcal: 2008 Prevnar 13: 2017  Shingles/Zostavax: declines Covid 19: 2/2, 2021, pfizer + boosters - dates requested  Names of Other Physician/Practitioners you currently use: 1. [facility] here for primary care 2. [employer], dentist, next appointment in 02/2023 3.  Dr. Hazle Quant vision, 2023  Patient Care Team: [name], MD as PCP - General (Internal Medicine) Nelson Chimes, MD as Consulting Physician (Ophthalmology) Sharrell Ku, MD as Consulting Physician (Gastroenterology) Ladene Artist, MD as Consulting Physician (Oncology)  History reviewed: allergies, current medications, past family history, past medical history, past social history, past surgical history and problem list  Allergies Allergies  Allergen Reactions   Mavik [Trandolapril]     Cough   Hctz [Hydrochlorothiazide] Rash    Photosensitivity    SURGICAL HISTORY He  has a past surgical history that includes Colon surgery (2004). FAMILY HISTORY His family history includes Autoimmune disease in his brother; Breast cancer in his sister; Cancer in his father and sister; Colon cancer in his cousin; Crohn's disease in his daughter; Heart disease in his mother. SOCIAL HISTORY He  reports that he has never smoked. He has never used smokeless tobacco. He reports current alcohol use. He reports that he does not use drugs.   MEDICARE WELLNESS OBJECTIVES: Physical activity: Current Exercise Habits: Home exercise routine Cardiac risk  factors:   Depression/mood screen:      05/28/2023    3:07 PM  Depression screen PHQ 2/9  Decreased Interest 0  Down, Depressed, Hopeless 0  PHQ - 2 Score 0    ADLs:     05/28/2023    3:07 PM 12/08/2022    9:06 PM  In your present state of health, do you have any difficulty performing the following activities:  Hearing? 0 0  Vision? 0 0  Difficulty concentrating or making decisions? 0 0  Walking or climbing stairs? 0 0  Dressing or bathing? 0 0  Doing errands, shopping? 0 0     Cognitive Testing  Alert? Yes  Normal Appearance?Yes  Oriented to person? Yes  Place? Yes   Time? Yes  Recall of three objects?  Yes  Can perform simple calculations? Yes  Displays appropriate judgment?Yes  Can read the correct time from a watch face?Yes  EOL planning: Does Patient Have a Medical Advance Directive?: No   Objective:   Blood pressure 128/76, pulse 77, temperature 97.8 F (36.6 C), height 5\' 8"  (1.727 m), weight 175 lb 9.6 oz (79.7 kg), SpO2 98%. Body mass index is 26.7 kg/m.  General appearance: alert, no distress, WD/WN, male HEENT: normocephalic,  sclerae anicteric, TMs pearly, nares patent, no discharge or erythema, pharynx normal Oral cavity: MMM, no lesions Neck: supple, no lymphadenopathy, no thyromegaly, no masses Heart: Irregular, normal S1, S2, no murmurs Lungs: CTA bilaterally, no wheezes, rhonchi, or rales Abdomen: +bs, soft, non tender, non distended, no masses, no hepatomegaly, no splenomegaly Musculoskeletal: nontender, no swelling, no obvious deformity.  Extremities: no edema, no cyanosis, no clubbing Pulses: 2+ symmetric, upper and lower extremities, normal cap refill Neurological: alert, oriented x 3, CN2-12 intact, strength normal upper extremities and lower extremities, sensation normal throughout, DTRs 2+ throughout, no cerebellar signs, gait normal Psychiatric: normal affect, behavior normal, pleasant   EKG:  NSR with PVC  Medicare Attestation I have  personally reviewed: The patient's medical and social history Their use of alcohol, tobacco or illicit drugs Their current medications and supplements The patient's functional ability including ADLs,fall risks, home safety risks, cognitive, and hearing and visual impairment Diet and physical activities Evidence for depression or mood disorders  The patient's weight, height, BMI, and visual acuity have been recorded in the chart.  I have made referrals, counseling, and provided education to the patient based on review of the above and I have provided the patient with a written personalized care plan for preventive services.     Adela Glimpse, NP   05/28/2023

## 2023-06-03 ENCOUNTER — Encounter: Payer: Self-pay | Admitting: Internal Medicine

## 2023-06-03 NOTE — Progress Notes (Signed)
Future Appointments  Date Time Provider Department  06/04/2023 10:00 AM [name], MD [facility]  09/01/2023                        9:30 AM Adela Glimpse, NP [facility]  03/01/2024                       cpe 11:00 AM [name], MD [facility]  05/30/2024                       wellness  2:30 PM Adela Glimpse, NP [facility]    History of Present Illness:      This very nice 82 y.o. married Filipino man with   HTN, HLD, Prediabetes and Vitamin D Deficiency  presents with c/o blood in his BM x 3 days .   Patient has been followed for . In 2004, patient had excision of a colon cancer.  In Sept 2023 he had a Negative Colonoscopy by Dr Barron Alvine  recommending a 5 year f/u in 2028.       Past Surgical History:  Procedure Laterality Date   COLON  Cancer SURGERY  /  Dr Earlene Plater  2004    Current Outpatient Medications on File Prior to Visit  Medication Sig   aspirin EC 81 MG tablet Take 81 mg by mouth daily. Swallow whole.   cetirizine (ZYRTEC) 10 MG tablet TAKE 1 TABLET DAILY FOR ALLERGIES   diltiazem (TIAZAC) 240 MG 24 hr capsule Take 1 capsule every Morning for BP   Folic Acid-Cholecalciferol (CHOLECAL DF PO) Take 2,000 Units by mouth daily.   Ipratropium-Albuterol (COMBIVENT RESPIMAT) 20-100 MCG/ACT AERS respimat Inhale 1 puff into the lungs every 4 (four) hours as needed for wheezing.   ketoconazole (NIZORAL) 2 % cream Apply 1 Application topically daily. Apply to Skin  Fungus / Ringworm   2 to 3 x /day   montelukast (SINGULAIR) 10 MG tablet TAKE 1 TABLET DAILY FOR ALLERGY AND ASTHMA   Multiple Vitamin (MULTIVITAMIN PO) Take by mouth daily.   olmesartan (BENICAR) 40 MG tablet Take 1 tablet every night for BP   rosuvastatin (CRESTOR) 40 MG tablet TAKE 1 TABLET DAILY FOR CHOLESTEROL   WIXELA INHUB 250-50 MCG/ACT AEPB USE 1 INHALATION IN THE MORNING AND AT BEDTIME   No current facility-administered medications on file prior to visit.    Allergies  Allergen Reactions    Mavik [Trandolapril]     Cough   Hctz [Hydrochlorothiazide] Rash    Photosensitivity     Problem list He has Hyperlipidemia; Hypertension; Vitamin D deficiency; Prediabetes; COPD with asthma; GERD (gastroesophageal reflux disease); History of colon cancer; BPH (benign prostatic hyperplasia); Medication management; Osteoarthritis of knees, bilateral; and Overweight (BMI 25.0-29.9) on their problem list.   Observations/Objective:  BP 138/82   Pulse 93   Temp 97.9 F (36.6 C)   Resp 16   Ht 5\' 8"  (1.727 m)   Wt 174 lb 3.2 oz (79 kg)   SpO2 96%   BMI 26.49 kg/m   HEENT - WNL. Neck - supple.  Chest - Clear equal BS. Cor - Nl HS. RRR w/o sig MGR. PP 1(+). No edema. Abd - Soft . Benign. Rectal - No ext hemorrhoids or fissures.  DRE - No masses . Stool light brown w/o obvious blood. Hemoccult (+) positive . MS- FROM w/o deformities.  Gait Nl. Neuro -  Nl w/o focal abnormalities.  Assessment and Plan:   1. Rectal bleeding  - CBC with Differential/Platelet  2. History of colon cancer  - CBC with Differential/Platelet   - GI referral for Anoscopy    Follow Up Instructions:        I discussed the assessment and treatment plan with the patient. The patient was provided an opportunity to ask questions and all were answered. The patient agreed with the plan and demonstrated an understanding of the instructions.       The patient was advised to call back or seek an in-person evaluation if the symptoms worsen or if the condition fails to improve as anticipated.    Marinus Maw, MD

## 2023-06-04 ENCOUNTER — Ambulatory Visit (INDEPENDENT_AMBULATORY_CARE_PROVIDER_SITE_OTHER): Payer: Medicare PPO | Admitting: Internal Medicine

## 2023-06-04 ENCOUNTER — Encounter: Payer: Self-pay | Admitting: Internal Medicine

## 2023-06-04 VITALS — BP 138/82 | HR 93 | Temp 97.9°F | Resp 16 | Ht 68.0 in | Wt 174.2 lb

## 2023-06-04 DIAGNOSIS — K625 Hemorrhage of anus and rectum: Secondary | ICD-10-CM | POA: Diagnosis not present

## 2023-06-04 DIAGNOSIS — Z85038 Personal history of other malignant neoplasm of large intestine: Secondary | ICD-10-CM

## 2023-06-07 NOTE — Progress Notes (Signed)
^<^<^<^<^<^<^<^<^<^<^<^<^<^<^<^<^<^<^<^<^<^<^<^<^<^<^<^<^<^<^<^<^<^<^<^<^ ^>^>^>^>^>^>^>^>^>^>^>>^>^>^>^>^>^>^>^>^>^>^>^>^>^>^>^>^>^>^>^>^>^>^>^>^>  -   CBC shows Hgb = Red Cell Count is Normal & stable                                                                                - No signs of significant blood loss   ^<^<^<^<^<^<^<^<^<^<^<^<^<^<^<^<^<^<^<^<^<^<^<^<^<^<^<^<^<^<^<^<^<^<^<^<^ ^>^>^>^>^>^>^>^>^>^>^>^>^>^>^>^>^>^>^>^>^>^>^>^>^>^>^>^>^>^>^>^>^>^>^>^>^

## 2023-07-01 ENCOUNTER — Other Ambulatory Visit: Payer: Self-pay | Admitting: Internal Medicine

## 2023-07-01 MED ORDER — CETIRIZINE HCL 10 MG PO TABS: ORAL_TABLET | ORAL | 3 refills | Status: DC

## 2023-09-01 ENCOUNTER — Encounter: Payer: Self-pay | Admitting: Nurse Practitioner

## 2023-09-01 ENCOUNTER — Ambulatory Visit (INDEPENDENT_AMBULATORY_CARE_PROVIDER_SITE_OTHER): Payer: Medicare PPO | Admitting: Nurse Practitioner

## 2023-09-01 VITALS — BP 150/94 | HR 81 | Temp 98.3°F | Ht 68.0 in | Wt 173.0 lb

## 2023-09-01 DIAGNOSIS — Z79899 Other long term (current) drug therapy: Secondary | ICD-10-CM

## 2023-09-01 DIAGNOSIS — I10 Essential (primary) hypertension: Secondary | ICD-10-CM

## 2023-09-01 DIAGNOSIS — J4531 Mild persistent asthma with (acute) exacerbation: Secondary | ICD-10-CM

## 2023-09-01 DIAGNOSIS — E559 Vitamin D deficiency, unspecified: Secondary | ICD-10-CM

## 2023-09-01 DIAGNOSIS — E663 Overweight: Secondary | ICD-10-CM

## 2023-09-01 DIAGNOSIS — N401 Enlarged prostate with lower urinary tract symptoms: Secondary | ICD-10-CM | POA: Diagnosis not present

## 2023-09-01 DIAGNOSIS — R351 Nocturia: Secondary | ICD-10-CM

## 2023-09-01 DIAGNOSIS — R7303 Prediabetes: Secondary | ICD-10-CM | POA: Diagnosis not present

## 2023-09-01 DIAGNOSIS — K219 Gastro-esophageal reflux disease without esophagitis: Secondary | ICD-10-CM

## 2023-09-01 DIAGNOSIS — M170 Bilateral primary osteoarthritis of knee: Secondary | ICD-10-CM

## 2023-09-01 DIAGNOSIS — E782 Mixed hyperlipidemia: Secondary | ICD-10-CM | POA: Diagnosis not present

## 2023-09-01 DIAGNOSIS — R7309 Other abnormal glucose: Secondary | ICD-10-CM

## 2023-09-01 DIAGNOSIS — K649 Unspecified hemorrhoids: Secondary | ICD-10-CM

## 2023-09-01 DIAGNOSIS — K625 Hemorrhage of anus and rectum: Secondary | ICD-10-CM

## 2023-09-01 NOTE — Patient Instructions (Signed)

## 2023-09-01 NOTE — Progress Notes (Signed)
FOLLOW UP Assessment:   Essential hypertension Elevated in clinic today - otherwise very well controlled Olmesartan scheduled for HS Patient to take BP at home and report back to iffice if continues to tsay elevated - Goal 130/80. Discussed DASH (Dietary Approaches to Stop Hypertension) DASH diet is lower in sodium than a typical American diet. Cut back on foods that are high in saturated fat, cholesterol, and trans fats. Eat more whole-grain foods, fish, poultry, and nuts Remain active and exercise as tolerated daily.  Check CMP/CBC  Mixed hyperlipidemia Discussed lifestyle modifications. Recommended diet heavy in fruits and veggies, omega 3's. Decrease consumption of animal meats, cheeses, and dairy products. Remain active and exercise as tolerated. Continue to monitor. Check lipids/TSH  Prediabetes Education: Reviewed 'ABCs' of diabetes management  Discussed goals to be met and/or maintained include A1C (<7) Blood pressure (<130/80) Cholesterol (LDL <70) Continue Eye Exam yearly  Continue Dental Exam Q6 mo Discussed dietary recommendations Discussed Physical Activity recommendations Check A1C  Vitamin D deficiency Continue supplement for goal of 60-100 Monitor Vitamin D levels  Medication management All medications discussed and reviewed in full. All questions and concerns regarding medications addressed.    Intermittent asthma Controlled on inhalers/montelukast Avoid triggers  Gastroesophageal reflux disease, esophagitis presence not specified Colonoscopy UTD 07/2022 No suspected reflux complications (Barret/stricture). Lifestyle modification:  wt loss, avoid meals 2-3h before bedtime. Consider eliminating food triggers:  chocolate, caffeine, EtOH, acid/spicy food.  Benign prostatic hyperplasia with nocturia Continue to monitor PSA at CPE  Overweight - BMI 25 Discussed appropriate BMI Diet modification. Physical activity. Encouraged/praised to build  confidence.  Osteoarthritis of bilateral knees Followed by Dr. Lajoyce Corners getting injection Continue to remain active - swimming Stay well hydrated Tylenol PRN  Rectal bleeding/Hemorrhoids Resolved  Continue surgery scheduled for next week 09/09/23.   Orders Placed This Encounter  Procedures   CBC with Differential/Platelet   COMPLETE METABOLIC PANEL WITH GFR   Lipid panel   Hemoglobin A1c   Notify office for further evaluation and treatment, questions or concerns if any reported s/s fail to improve.   The patient was advised to call back or seek an in-person evaluation if any symptoms worsen or if the condition fails to improve as anticipated.   Further disposition pending results of labs. Discussed med's effects and SE's.    I discussed the assessment and treatment plan with the patient. The patient was provided an opportunity to ask questions and all were answered. The patient agreed with the plan and demonstrated an understanding of the instructions.  Discussed med's effects and SE's. Screening labs and tests as requested with regular follow-up as recommended.  I provided 35 minutes of face-to-face time during this encounter including counseling, chart review, and critical decision making was preformed.   Future Appointments  Date Time Provider Department Center  09/08/2023  8:40 AM Shellia Cleverly, DO [facility]  12/02/2023 10:30 AM Adela Glimpse, NP [facility] None  03/01/2024 11:00 AM [name], MD [facility] None  05/31/2024 10:30 AM Adela Glimpse, NP [facility] None      Subjective:  Jon [name] is a 82 y.o. male who presents for a 3 month follow up for HTN, hyperlipidemia, prediabetes, and vitamin D Def.   Overall he reports feeling well today.  He has no new concerns at time.  He has a history of colon cancer in 2004, up to date on colonoscopy. Had rectal bleeding 06/2023 found to be contributed to hemorrhoids.  Has surgery scheduled for next  week to  have them removed.  He denies any further issues at this time.   Asthma well controlled on inhalers and singulair   He is followed by Dr. Lajoyce Corners for R knee pain and getting knee injections which are helpful, walking and swimming has been helpful.   BMI is Body mass index is 26.3 kg/m., he has been working on diet and exercise, swims at walks intermittently as knee allows.  Wt Readings from Last 3 Encounters:  09/01/23 173 lb (78.5 kg)  06/04/23 174 lb 3.2 oz (79 kg)  05/28/23 175 lb 9.6 oz (79.7 kg)   His blood pressure is typically well controlled (110s-120s/70s), has cuff but hasn't been checking, today their BP is BP: (!) 150/94, which is higher than normal.  He contributes this to staying up late and watching the football game while drinking coffee.  He did not sleep well.  He does workout, no longer running due to knees, walking and stretching.    He denies chest pain, shortness of breath, dizziness. BP average in home 130/80.  BP Readings from Last 3 Encounters:  09/01/23 (!) 150/94  06/04/23 138/82  05/28/23 128/76     He is on cholesterol medication (crestor 40 mg) and denies myalgias. His cholesterol is at goal. The cholesterol last visit was:   Lab Results  Component Value Date   CHOL 144 05/28/2023   HDL 59 05/28/2023   LDLCALC 66 05/28/2023   TRIG 111 05/28/2023   CHOLHDL 2.4 05/28/2023   He has been working on diet and exercise for prediabetes but admits he likes bread and sweets, has cut down potatoes and , and denies paresthesia of the feet, polydipsia and polyuria. Last A1C in the office was:  Lab Results  Component Value Date   HGBA1C 6.3 (H) 05/28/2023    Last GFR:  Lab Results  Component Value Date   GFRNONAA 72 02/14/2021   Patient is on Vitamin D supplement and above goal, has reduced from 10000 IU to 2000 IU daily   Lab Results  Component Value Date   VD25OH 92 05/28/2023     Medication Review: Current Outpatient Medications on File Prior to  Visit  Medication Sig Dispense Refill   aspirin EC 81 MG tablet Take 81 mg by mouth daily. Swallow whole.     cetirizine (ZYRTEC) 10 MG tablet TAKE 1 TABLET DAILY FOR ALLERGIES                                                             /                                                                   TAKE                                         BY  MOUTH 90 tablet 3   diltiazem (TIAZAC) 240 MG 24 hr capsule Take 1 capsule every Morning for BP 90 capsule 3   Folic Acid-Cholecalciferol (CHOLECAL DF PO) Take 2,000 Units by mouth daily.     Ipratropium-Albuterol (COMBIVENT RESPIMAT) 20-100 MCG/ACT AERS respimat Inhale 1 puff into the lungs every 4 (four) hours as needed for wheezing. 4 g 4   ketoconazole (NIZORAL) 2 % cream Apply 1 Application topically daily. Apply to Skin  Fungus / Ringworm   2 to 3 x /day 60 g 0   montelukast (SINGULAIR) 10 MG tablet TAKE 1 TABLET DAILY FOR ALLERGY AND ASTHMA 90 tablet 3   Multiple Vitamin (MULTIVITAMIN PO) Take by mouth daily.     olmesartan (BENICAR) 40 MG tablet Take 1 tablet every night for BP 90 tablet 3   rosuvastatin (CRESTOR) 40 MG tablet TAKE 1 TABLET DAILY FOR CHOLESTEROL 90 tablet 3   WIXELA INHUB 250-50 MCG/ACT AEPB USE 1 INHALATION IN THE MORNING AND AT BEDTIME 180 each 3   No current facility-administered medications on file prior to visit.    Current Problems (verified) Patient Active Problem List   Diagnosis Date Noted   Overweight (BMI 25.0-29.9) 04/22/2018   Osteoarthritis of knees, bilateral 06/24/2017   Medication management 02/16/2014   Hyperlipidemia    Hypertension    Vitamin D deficiency    Prediabetes    COPD with asthma (HCC)    GERD (gastroesophageal reflux disease)    History of colon cancer    BPH (benign prostatic hyperplasia)     Screening Tests Immunization History  Administered Date(s) Administered   DT (Pediatric) 07/10/2009   Fluad Trivalent(High Dose 65+)  08/04/2023   Influenza Split 07/28/2013   Influenza, High Dose Seasonal PF 09/18/2014, 07/26/2015, 08/18/2016, 07/13/2017, 07/28/2018, 08/03/2019, 10/08/2020, 08/30/2021, 08/26/2022   PFIZER(Purple Top)SARS-COV-2 Vaccination 12/17/2019, 01/08/2020, 09/17/2020, 08/22/2021   Pneumococcal Conjugate-13 02/07/2016   Pneumococcal Polysaccharide-23 02/22/2007   Tdap 06/03/2020   Unspecified SARS-COV-2 Vaccination 08/04/2023   Patient Care Team: [name], MD as PCP - General (Internal Medicine) Nelson Chimes, MD as Consulting Physician (Ophthalmology) Sharrell Ku, MD as Consulting Physician (Gastroenterology) Ladene Artist, MD as Consulting Physician (Oncology)  History reviewed: allergies, current medications, past family history, past medical history, past social history, past surgical history and problem list  Allergies Allergies  Allergen Reactions   Mavik [Trandolapril]     Cough   Hctz [Hydrochlorothiazide] Rash    Photosensitivity    SURGICAL HISTORY He  has a past surgical history that includes Colon surgery (2004). FAMILY HISTORY His family history includes Autoimmune disease in his brother; Breast cancer in his sister; Cancer in his father and sister; Colon cancer in his cousin; Crohn's disease in his daughter; Heart disease in his mother. SOCIAL HISTORY He  reports that he has never smoked. He has never used smokeless tobacco. He reports current alcohol use. He reports that he does not use drugs.  Objective:   Blood pressure (!) 150/94, pulse 81, temperature 98.3 F (36.8 C), height 5\' 8"  (1.727 m), weight 173 lb (78.5 kg), SpO2 99%. Body mass index is 26.3 kg/m.  General appearance: alert, no distress, WD/WN, male HEENT: normocephalic, sclerae anicteric, TMs pearly, nares patent, no discharge or erythema, pharynx normal Oral cavity: MMM, no lesions Neck: supple, no lymphadenopathy, no thyromegaly, no masses Heart: Irregular, normal S1, S2, no  murmurs Lungs: CTA bilaterally, no wheezes, rhonchi, or rales Abdomen: +bs, soft, non tender, non distended, no masses, no hepatomegaly, no splenomegaly  Musculoskeletal: nontender, no swelling, no obvious deformity.  Extremities: no edema, no cyanosis, no clubbing Pulses: 2+ symmetric, upper and lower extremities, normal cap refill Neurological: alert, oriented x 3, CN2-12 intact, strength normal upper extremities and lower extremities, sensation normal throughout, DTRs 2+ throughout, no cerebellar signs, gait normal Psychiatric: normal affect, behavior normal, pleasant    Jon Stephens, NP   09/01/2023

## 2023-09-02 LAB — COMPLETE METABOLIC PANEL WITH GFR
AG Ratio: 1.3 (calc) (ref 1.0–2.5)
ALT: 26 U/L (ref 9–46)
AST: 26 U/L (ref 10–35)
Albumin: 4.1 g/dL (ref 3.6–5.1)
Alkaline phosphatase (APISO): 111 U/L (ref 35–144)
BUN: 22 mg/dL (ref 7–25)
CO2: 29 mmol/L (ref 20–32)
Calcium: 9.3 mg/dL (ref 8.6–10.3)
Chloride: 105 mmol/L (ref 98–110)
Creat: 1.11 mg/dL (ref 0.70–1.22)
Globulin: 3.1 g/dL (ref 1.9–3.7)
Glucose, Bld: 86 mg/dL (ref 65–99)
Potassium: 4.3 mmol/L (ref 3.5–5.3)
Sodium: 142 mmol/L (ref 135–146)
Total Bilirubin: 0.4 mg/dL (ref 0.2–1.2)
Total Protein: 7.2 g/dL (ref 6.1–8.1)
eGFR: 66 mL/min/{1.73_m2} (ref >=60)

## 2023-09-02 LAB — LIPID PANEL
Cholesterol: 162 mg/dL (ref <200)
HDL: 57 mg/dL (ref >=40)
LDL Cholesterol (Calc): 87 mg/dL
Non-HDL Cholesterol (Calc): 105 mg/dL (ref <130)
Total CHOL/HDL Ratio: 2.8 (calc) (ref <5.0)
Triglycerides: 86 mg/dL (ref <150)

## 2023-09-02 LAB — CBC WITH DIFFERENTIAL/PLATELET
Absolute Lymphocytes: 1318 {cells}/uL (ref 850–3900)
Absolute Monocytes: 648 {cells}/uL (ref 200–950)
Basophils Absolute: 130 {cells}/uL (ref 0–200)
Basophils Relative: 2.4 %
Eosinophils Absolute: 502 {cells}/uL — ABNORMAL HIGH (ref 15–500)
Eosinophils Relative: 9.3 %
HCT: 42.5 % (ref 38.5–50.0)
Hemoglobin: 13.8 g/dL (ref 13.2–17.1)
MCH: 29.2 pg (ref 27.0–33.0)
MCHC: 32.5 g/dL (ref 32.0–36.0)
MCV: 90 fL (ref 80.0–100.0)
MPV: 10.8 fL (ref 7.5–12.5)
Monocytes Relative: 12 %
Neutro Abs: 2803 {cells}/uL (ref 1500–7800)
Neutrophils Relative %: 51.9 %
Platelets: 204 10*3/uL (ref 140–400)
RBC: 4.72 10*6/uL (ref 4.20–5.80)
RDW: 13.8 % (ref 11.0–15.0)
Total Lymphocyte: 24.4 %
WBC: 5.4 10*3/uL (ref 3.8–10.8)

## 2023-09-02 LAB — HEMOGLOBIN A1C
Hgb A1c MFr Bld: 6.2 %{Hb} — ABNORMAL HIGH (ref <5.7)
Mean Plasma Glucose: 131 mg/dL
eAG (mmol/L): 7.3 mmol/L

## 2023-09-08 ENCOUNTER — Ambulatory Visit (INDEPENDENT_AMBULATORY_CARE_PROVIDER_SITE_OTHER): Payer: Medicare PPO | Admitting: Gastroenterology

## 2023-09-08 ENCOUNTER — Encounter: Payer: Self-pay | Admitting: Gastroenterology

## 2023-09-08 VITALS — BP 162/90 | HR 82 | Ht 68.0 in | Wt 174.8 lb

## 2023-09-08 DIAGNOSIS — K921 Melena: Secondary | ICD-10-CM

## 2023-09-08 DIAGNOSIS — Z85038 Personal history of other malignant neoplasm of large intestine: Secondary | ICD-10-CM | POA: Diagnosis not present

## 2023-09-08 DIAGNOSIS — K649 Unspecified hemorrhoids: Secondary | ICD-10-CM | POA: Diagnosis not present

## 2023-09-08 NOTE — Patient Instructions (Addendum)
_______________________________________________________  If your blood pressure at your visit was 140/90 or greater, please contact your primary care physician to follow up on this. _______________________________________________________  If you are age 82 or older, your body mass index should be between 23-30. Your Body mass index is 26.58 kg/m. If this is out of the aforementioned range listed, please consider follow up with your Primary Care Provider. ________________________________________________________  Bonita Quin will follow up in our office on an as needed basis.   The [facility] providers would like to encourage you to use [facility] to communicate with providers for non-urgent requests or questions.  Due to long hold times on the telephone, sending your provider a message by [facility] may be a faster and more efficient way to get a response.  Please allow 48 business hours for a response.  Please remember that this is for non-urgent requests.  _______________________________________________________  It was a pleasure to see you today!  Vito Cirigliano, D.O.

## 2023-09-08 NOTE — Progress Notes (Unsigned)
Chief Complaint:    Hematochezia  GI History: 82 y.o. male retired [employer] with a history of HTN, hyperlipidemia, BPH, asthma, and history of colon cancer as below.   Previously followed with Dr. Kinnie Scales.  History of colon cancer s/p resection, chemotherapy, and radiation 2004.  Limited prior available records as below:   -04/04/2003: Partial colectomy and appendectomy with path demonstrating moderately differentiated rectal adenocarcinoma with deep invasion of muscularis propria and metastasis of 1/8 perirectal lymph nodes - 03/17/2011: Colonoscopy (Dr. Kinnie Scales): No report available for review, but pathology report reviewed (mucosal prolapse polyp) and recommended repeat in 5 years  - 03/17/2016: Colonoscopy (Dr. Kinnie Scales): Pandiverticulosis, internal hemorrhoids.  Repeat in 5 years  - 06/12/2022: Initial appointment in the [facility] to transfer care.  No GI symptoms. - 07/04/2022: Colonoscopy: 4 mm cecal polyp (path benign), 5 mm descending colon adenoma, pandiverticulosis, healthy-appearing anastomosis in the rectosigmoid, small internal hemorrhoids.  Normal TI.  HPI:     Patient is a 82 y.o. male presenting to the Gastroenterology Clinic for evaluation of hematochezia.  Single episode of bleeidng 3 months ago. Saw his PCM on 06/04/2023 for this and attributd to hemorrhoids. No bleeding since then. On TP only. Uses metamucil prn. Keeps good hydration.    Reviewed most recent labs from 09/01/2023: Normal CBC, CMP.  No recent abdominal imaging for review.   Review of systems:     No chest pain, no SOB, no fevers, no urinary sx   Past Medical History:  Diagnosis Date   Allergy    Asthma    BPH (benign prostatic hyperplasia)    GERD (gastroesophageal reflux disease)    History of colon cancer    Hyperlipidemia    Hypertension    Prediabetes    Vitamin D deficiency     Patient's surgical history, family medical history, social history, medications and allergies were all reviewed in  Epic    Current Outpatient Medications  Medication Sig Dispense Refill   aspirin EC 81 MG tablet Take 81 mg by mouth daily. Swallow whole.     cetirizine (ZYRTEC) 10 MG tablet TAKE 1 TABLET DAILY FOR ALLERGIES                                                             /                                                                   TAKE                                         BY                                                 MOUTH 90 tablet 3   diltiazem (TIAZAC) 240 MG 24 hr capsule Take  1 capsule every Morning for BP 90 capsule 3   Folic Acid-Cholecalciferol (CHOLECAL DF PO) Take 2,000 Units by mouth daily.     Ipratropium-Albuterol (COMBIVENT RESPIMAT) 20-100 MCG/ACT AERS respimat Inhale 1 puff into the lungs every 4 (four) hours as needed for wheezing. 4 g 4   montelukast (SINGULAIR) 10 MG tablet TAKE 1 TABLET DAILY FOR ALLERGY AND ASTHMA 90 tablet 3   Multiple Vitamin (MULTIVITAMIN PO) Take by mouth daily.     olmesartan (BENICAR) 40 MG tablet Take 1 tablet every night for BP 90 tablet 3   rosuvastatin (CRESTOR) 40 MG tablet TAKE 1 TABLET DAILY FOR CHOLESTEROL 90 tablet 3   WIXELA INHUB 250-50 MCG/ACT AEPB USE 1 INHALATION IN THE MORNING AND AT BEDTIME 180 each 3   No current facility-administered medications for this visit.    Physical Exam:     BP (!) 162/90   Pulse 82   Ht 5\' 8"  (1.727 m)   Wt 174 lb 12.8 oz (79.3 kg)   BMI 26.58 kg/m   GENERAL:  Pleasant *** in NAD PSYCH: : Cooperative, normal affect EENT:  conjunctiva pink, mucous membranes moist, neck supple without masses CARDIAC:  RRR, ***murmur heard, no peripheral edema PULM: Normal respiratory effort, lungs CTA bilaterally, no wheezing ABDOMEN:  Nondistended, soft, nontender. No obvious masses, no hepatomegaly,  normal bowel sounds SKIN:  turgor, no lesions seen Musculoskeletal:  Normal muscle tone, normal strength NEURO: Alert and oriented x 3, no focal neurologic deficits   IMPRESSION and PLAN:    1)    RTC prn Call if wanting banding           Shellia Cleverly ,DO, FACG 09/08/2023, 8:57 AM

## 2023-10-12 ENCOUNTER — Other Ambulatory Visit: Payer: Self-pay

## 2023-10-12 ENCOUNTER — Telehealth: Payer: Self-pay | Admitting: Nurse Practitioner

## 2023-10-12 DIAGNOSIS — E782 Mixed hyperlipidemia: Secondary | ICD-10-CM

## 2023-10-12 MED ORDER — ROSUVASTATIN CALCIUM 40 MG PO TABS
ORAL_TABLET | ORAL | 3 refills | Status: DC
Start: 2023-10-12 — End: 2024-03-09

## 2023-10-12 NOTE — Telephone Encounter (Signed)
Refill request on Crestor. Please send to [facility] - [place], [place] - [address]

## 2023-10-15 DIAGNOSIS — H2513 Age-related nuclear cataract, bilateral: Secondary | ICD-10-CM | POA: Diagnosis not present

## 2023-10-15 DIAGNOSIS — H524 Presbyopia: Secondary | ICD-10-CM | POA: Diagnosis not present

## 2023-10-15 DIAGNOSIS — H04123 Dry eye syndrome of bilateral lacrimal glands: Secondary | ICD-10-CM | POA: Diagnosis not present

## 2023-11-02 ENCOUNTER — Emergency Department (HOSPITAL_COMMUNITY): Payer: Medicare PPO

## 2023-11-02 ENCOUNTER — Other Ambulatory Visit: Payer: Self-pay

## 2023-11-02 ENCOUNTER — Encounter (HOSPITAL_COMMUNITY): Payer: Self-pay

## 2023-11-02 ENCOUNTER — Emergency Department (HOSPITAL_COMMUNITY)
Admission: EM | Admit: 2023-11-02 | Discharge: 2023-11-03 | Disposition: A | Discharge status: Home / Self Care | Payer: Medicare PPO | Attending: Emergency Medicine | Admitting: Emergency Medicine

## 2023-11-02 DIAGNOSIS — R197 Diarrhea, unspecified: Secondary | ICD-10-CM | POA: Insufficient documentation

## 2023-11-02 DIAGNOSIS — N400 Enlarged prostate without lower urinary tract symptoms: Secondary | ICD-10-CM | POA: Diagnosis not present

## 2023-11-02 DIAGNOSIS — K8020 Calculus of gallbladder without cholecystitis without obstruction: Secondary | ICD-10-CM | POA: Insufficient documentation

## 2023-11-02 DIAGNOSIS — J45909 Unspecified asthma, uncomplicated: Secondary | ICD-10-CM | POA: Insufficient documentation

## 2023-11-02 DIAGNOSIS — Z923 Personal history of irradiation: Secondary | ICD-10-CM | POA: Diagnosis not present

## 2023-11-02 DIAGNOSIS — I10 Essential (primary) hypertension: Secondary | ICD-10-CM | POA: Diagnosis not present

## 2023-11-02 DIAGNOSIS — E785 Hyperlipidemia, unspecified: Secondary | ICD-10-CM | POA: Insufficient documentation

## 2023-11-02 DIAGNOSIS — Z9221 Personal history of antineoplastic chemotherapy: Secondary | ICD-10-CM | POA: Insufficient documentation

## 2023-11-02 DIAGNOSIS — K5730 Diverticulosis of large intestine without perforation or abscess without bleeding: Secondary | ICD-10-CM | POA: Insufficient documentation

## 2023-11-02 DIAGNOSIS — I700 Atherosclerosis of aorta: Secondary | ICD-10-CM | POA: Diagnosis not present

## 2023-11-02 DIAGNOSIS — D849 Immunodeficiency, unspecified: Secondary | ICD-10-CM | POA: Diagnosis not present

## 2023-11-02 DIAGNOSIS — K921 Melena: Secondary | ICD-10-CM | POA: Insufficient documentation

## 2023-11-02 DIAGNOSIS — Z85038 Personal history of other malignant neoplasm of large intestine: Secondary | ICD-10-CM | POA: Insufficient documentation

## 2023-11-02 DIAGNOSIS — K449 Diaphragmatic hernia without obstruction or gangrene: Secondary | ICD-10-CM | POA: Diagnosis not present

## 2023-11-02 DIAGNOSIS — I2510 Atherosclerotic heart disease of native coronary artery without angina pectoris: Secondary | ICD-10-CM | POA: Insufficient documentation

## 2023-11-02 LAB — CBC WITH DIFFERENTIAL/PLATELET
Abs Immature Granulocytes: 0.01 10*3/uL (ref 0.00–0.07)
Basophils Absolute: 0.1 10*3/uL (ref 0.0–0.1)
Basophils Relative: 2 %
Eosinophils Absolute: 0.5 10*3/uL (ref 0.0–0.5)
Eosinophils Relative: 8 %
HCT: 42.1 % (ref 39.0–52.0)
Hemoglobin: 13.6 g/dL (ref 13.0–17.0)
Immature Granulocytes: 0 %
Lymphocytes Relative: 28 %
Lymphs Abs: 1.7 10*3/uL (ref 0.7–4.0)
MCH: 29 pg (ref 26.0–34.0)
MCHC: 32.3 g/dL (ref 30.0–36.0)
MCV: 89.8 fL (ref 80.0–100.0)
Monocytes Absolute: 0.7 10*3/uL (ref 0.1–1.0)
Monocytes Relative: 11 %
Neutro Abs: 3.1 10*3/uL (ref 1.7–7.7)
Neutrophils Relative %: 51 %
Platelets: 198 10*3/uL (ref 150–400)
RBC: 4.69 MIL/uL (ref 4.22–5.81)
RDW: 14.3 % (ref 11.5–15.5)
WBC: 6.1 10*3/uL (ref 4.0–10.5)
nRBC: 0 % (ref 0.0–0.2)

## 2023-11-02 LAB — COMPREHENSIVE METABOLIC PANEL
ALT: 24 U/L (ref 0–44)
AST: 28 U/L (ref 15–41)
Albumin: 3.6 g/dL (ref 3.5–5.0)
Alkaline Phosphatase: 91 U/L (ref 38–126)
Anion gap: 9 (ref 5–15)
BUN: 17 mg/dL (ref 8–23)
CO2: 25 mmol/L (ref 22–32)
Calcium: 9 mg/dL (ref 8.9–10.3)
Chloride: 104 mmol/L (ref 98–111)
Creatinine, Ser: 1.03 mg/dL (ref 0.61–1.24)
GFR, Estimated: >60 mL/min (ref >60)
Glucose, Bld: 96 mg/dL (ref 70–99)
Potassium: 4 mmol/L (ref 3.5–5.1)
Sodium: 138 mmol/L (ref 135–145)
Total Bilirubin: 0.5 mg/dL (ref 0.0–1.2)
Total Protein: 6.8 g/dL (ref 6.5–8.1)

## 2023-11-02 LAB — POC OCCULT BLOOD, ED: Fecal Occult Bld: POSITIVE — AB

## 2023-11-02 LAB — C DIFFICILE QUICK SCREEN W PCR REFLEX
C Diff antigen: NEGATIVE
C Diff interpretation: NOT DETECTED
C Diff toxin: NEGATIVE

## 2023-11-02 LAB — CBC
HCT: 42.5 % (ref 39.0–52.0)
Hemoglobin: 13.5 g/dL (ref 13.0–17.0)
MCH: 29.2 pg (ref 26.0–34.0)
MCHC: 31.8 g/dL (ref 30.0–36.0)
MCV: 91.8 fL (ref 80.0–100.0)
Platelets: 193 10*3/uL (ref 150–400)
RBC: 4.63 MIL/uL (ref 4.22–5.81)
RDW: 14.5 % (ref 11.5–15.5)
WBC: 13.4 10*3/uL — ABNORMAL HIGH (ref 4.0–10.5)
nRBC: 0 % (ref 0.0–0.2)

## 2023-11-02 LAB — PROTIME-INR
INR: 0.9 (ref 0.8–1.2)
Prothrombin Time: 12.6 s (ref 11.4–15.2)

## 2023-11-02 MED ORDER — IOHEXOL 350 MG/ML SOLN
75.0000 mL | Freq: Once | INTRAVENOUS | Status: AC | PRN
  Administered 2023-11-02: 75 mL via INTRAVENOUS

## 2023-11-02 NOTE — ED Provider Notes (Signed)
[place] EMERGENCY DEPARTMENT AT [facility] Provider Note  HPI   Jon [name]. is a 82 y.o. male patient with a PMHx of hyperlipidemia hypertension, colon cancer in the past, here today with concern for blood in stool.  Patient states that he has a history of colon cancer 20 years ago, had surgery radiation and chemotherapy, has been in remission since.  He states he did a recent colonoscopy within 6 months ago however the last one I am seen was a year ago  He states that today at 11 AM, he started having bowel movements and noted some bright red blood, he was told he has hemorrhoids in the recent past, he has had multiple episodes initially watery, then became more solidified, and then the past couple episodes of stool have been brown in nature and not red.  He has had no chest pain shortness of breath fevers chills no recent travel no abdominal pain at all no rectal pain  ROS Negative except as per HPI  Medical Decision Making   Upon presentation, the patient is afebrile hemodynamically stable overall appears well  Perform rectal exam, Hemoccult positive, prior to me seeing the patient, he already had a C. difficile test which was negative, and a GI pathogen panel in process which will take a while to come back.  His initial metabolic panel looks largely normal, hemoglobin 13.6, we repeated this for 5 hours later as the initial hemoglobin was obtained prior to me even seeing the patient, and hemoglobin of 13.5, coagulation of the normal We will obtain a CT abdomen pelvis to further characterize intra-abdominal process  Of note, patient did have a blood pressure documented 69/54, unclear if this is a true blood pressure as it immediately responded to normal ranges around 110s, without any intervention.  Right now, if the CT abdomen pelvis did not show any severe finding, and he has had a repeat hemoglobin and is now having no blood in the stool, I think this patient will  probably be safe to go.  No blood thinner use that require reversal.  Will sign this out to the next emergency medicine provider, pending CT abdomen pelvis  No diagnosis found.  @DISPOSITION @  Rx / DC Orders ED Discharge Orders     None        Past Medical History:  Diagnosis Date   Allergy    Asthma    BPH (benign prostatic hyperplasia)    GERD (gastroesophageal reflux disease)    History of colon cancer    Hyperlipidemia    Hypertension    Prediabetes    Vitamin D deficiency    Past Surgical History:  Procedure Laterality Date   COLON SURGERY  2004   Dr Earlene Plater   Family History  Problem Relation Age of Onset   Heart disease Mother    Cancer Father    Cancer Sister    Breast cancer Sister    Autoimmune disease Brother    Crohn's disease Daughter    Colon cancer Cousin        Second cousin on dad side   Esophageal cancer Neg Hx    Rectal cancer Neg Hx    Stomach cancer Neg Hx    Social History   Socioeconomic History   Marital status: Married    Spouse name: Not on file   Number of children: Not on file   Years of education: Not on file   Highest education level: Not on file  Occupational History   Not on file  Tobacco Use   Smoking status: Never   Smokeless tobacco: Never  Vaping Use   Vaping status: Never Used  Substance and Sexual Activity   Alcohol use: Yes    Alcohol/week: 7.0 standard drinks of alcohol    Types: 7 Glasses of wine per week   Drug use: No   Sexual activity: Not on file  Other Topics Concern   Not on file  Social History Narrative   Not on file   Social Drivers of Health   Financial Resource Strain: Not on file  Food Insecurity: Not on file  Transportation Needs: Not on file  Physical Activity: Not on file  Stress: Not on file  Social Connections: Not on file  Intimate Partner Violence: Not on file     Physical Exam   Vitals:   11/02/23 1556 11/02/23 2101 11/02/23 2125 11/02/23 2130  BP:  (!) 69/54 102/74    Pulse:  (!) 53    Resp:  13    Temp:    (!) 97.4 F (36.3 C)  TempSrc:    Oral  SpO2:  98%    Weight: 79.3 kg     Height: 5\' 8"  (1.727 m)       Physical Exam Vitals and nursing note reviewed.  Constitutional:      General: He is not in acute distress.    Appearance: Normal appearance. He is well-developed. He is not ill-appearing or toxic-appearing.  HENT:     Head: Normocephalic and atraumatic.     Right Ear: External ear normal.     Left Ear: External ear normal.     Nose: Nose normal.     Mouth/Throat:     Mouth: Mucous membranes are moist.  Eyes:     Extraocular Movements: Extraocular movements intact.     Pupils: Pupils are equal, round, and reactive to light.  Cardiovascular:     Rate and Rhythm: Normal rate.     Pulses: Normal pulses.  Pulmonary:     Effort: Pulmonary effort is normal. No respiratory distress.     Breath sounds: Normal breath sounds. No stridor. No wheezing, rhonchi or rales.  Abdominal:     Palpations: Abdomen is soft.     Tenderness: There is no abdominal tenderness. There is no right CVA tenderness or left CVA tenderness.  Genitourinary:    Comments: Chaperoned by nursing assistant, brown stool, Hemoccult positive Musculoskeletal:        General: Normal range of motion.     Cervical back: Normal range of motion and neck supple.  Skin:    General: Skin is warm and dry.     Capillary Refill: Capillary refill takes less than 2 seconds.  Neurological:     General: No focal deficit present.     Mental Status: He is alert and oriented to person, place, and time. Mental status is at baseline.  Psychiatric:        Mood and Affect: Mood normal.    Procedures   If procedures were preformed on this patient, they are listed below:  Procedures  The patient was seen, evaluated, and treated in conjunction with the attending physician, who voiced agreement in the care provided.  Note generated using Dragon voice dictation software and may contain  dictation errors. Please contact me for any clarification or with any questions.   Electronically signed by:  Osvaldo Shipper, M.D. (PGY-2)    Gunnar Bulla, MD 11/02/23 [phone]  Virgina Norfolk, DO 11/02/23 2309

## 2023-11-02 NOTE — ED Provider Triage Note (Signed)
Emergency Medicine Provider Triage Evaluation Note  Jon [name]. , a 82 y.o. male  was evaluated in triage.  Pt complains of profuse diarrhea for the last day. Large amount of blood in diarrhea. No n/v. No fevers, chill, recent illness. No recent abx. Hx of colon cancer. No abdominal pain. No use of blood thinners.  Review of Systems  Positive: diarrhea Negative: Abdominal pain  Physical Exam  BP (!) 160/119 (BP Location: Right Arm)   Pulse 87   Temp 98.1 F (36.7 C) (Oral)   Resp 16   Ht 5\' 8"  (1.727 m)   Wt 79.3 kg   SpO2 97%   BMI 26.58 kg/m  Gen:   Awake, no distress   Resp:  Normal effort  MSK:   Moves extremities without difficulty  Other:    Medical Decision Making  Medically screening exam initiated at 4:16 PM.  Appropriate orders placed.  Jon [name]. was informed that the remainder of the evaluation will be completed by another provider, this initial triage assessment does not replace that evaluation, and the importance of remaining in the ED until their evaluation is complete.     Pete Pelt, [place] 11/02/23 [phone]

## 2023-11-02 NOTE — ED Triage Notes (Signed)
Pt c/o diarrhea and bright red blood in stool started at 1100 today. Pt denies blood thinner. Pt c/o weakness. Pt denies dizziness

## 2023-11-02 NOTE — ED Provider Notes (Signed)
Patient here with blood in his stool.  History of colon cancer.  In remission.  Recently had unremarkable colonoscopy.  He had a couple episodes of blood in his stool today but the last couple bowel movements have been normal with no stool being bloody.  He has had mostly loose stools today.  But the last stool that he had was more normal in texture.  He is not on any blood thinners.  He has history of high cholesterol hypertension BPH.  Overall this is a supervised resident visit.  Grossly bloody stool earlier today.  He has a white count of 13.  However his hemoglobin is at baseline at 13.6.  Repeat hemoglobin after 4 hours is stable at 13.5.  Overall I have very low suspicion that this is a GI bleed.  Stool is no longer bloody and I do think that this is likely some sort of inflammatory or infectious diarrhea.  He is not having any major abdominal pain.  His C. difficile test was negative.  Will get a CT scan to ensure no obvious emergent findings but think that he can likely follow-up outpatient or return if symptoms get worse.  If there is some sort of inflammatory process can consider some antibiotics.  This chart was dictated using voice recognition software.  Despite best efforts to proofread,  errors can occur which can change the documentation meaning.    Virgina Norfolk, DO 11/02/23 2225

## 2023-11-02 NOTE — ED Notes (Signed)
Pt had LARGE BM only bright red blood in triage.

## 2023-11-03 ENCOUNTER — Telehealth: Payer: Self-pay | Admitting: Nurse Practitioner

## 2023-11-03 NOTE — Telephone Encounter (Signed)
I called patient to schedule a hospital follow up. The patient declined and is just going to come in at his next scheduled appointment on 1/29.

## 2023-11-03 NOTE — Discharge Instructions (Signed)
 You were evaluated in the Emergency Department and after careful evaluation, we did not find any emergent condition requiring admission or further testing in the hospital.  Your exam/testing today is overall reassuring.  Recommend close follow-up with your regular doctor to discuss her symptoms.  Please return to the Emergency Department if you experience any worsening of your condition.   Thank you for allowing us  to be a part of your care.

## 2023-11-04 LAB — GASTROINTESTINAL PANEL BY PCR, STOOL (REPLACES STOOL CULTURE)

## 2023-11-05 ENCOUNTER — Encounter: Payer: Self-pay | Admitting: Internal Medicine

## 2023-11-05 ENCOUNTER — Ambulatory Visit: Payer: Medicare PPO | Admitting: Internal Medicine

## 2023-11-05 VITALS — BP 142/80 | HR 93 | Temp 98.0°F | Resp 16 | Ht 68.0 in | Wt 178.6 lb

## 2023-11-05 DIAGNOSIS — K648 Other hemorrhoids: Secondary | ICD-10-CM

## 2023-11-05 DIAGNOSIS — K644 Residual hemorrhoidal skin tags: Secondary | ICD-10-CM

## 2023-11-05 DIAGNOSIS — K625 Hemorrhage of anus and rectum: Secondary | ICD-10-CM

## 2023-11-05 DIAGNOSIS — K602 Anal fissure, unspecified: Secondary | ICD-10-CM | POA: Diagnosis not present

## 2023-11-05 DIAGNOSIS — I10 Essential (primary) hypertension: Secondary | ICD-10-CM

## 2023-11-05 LAB — CBC WITH DIFFERENTIAL/PLATELET
Absolute Lymphocytes: 1392 {cells}/uL (ref 850–3900)
Absolute Monocytes: 779 {cells}/uL (ref 200–950)
Basophils Absolute: 118 {cells}/uL (ref 0–200)
Basophils Relative: 2 %
Eosinophils Absolute: 566 {cells}/uL — ABNORMAL HIGH (ref 15–500)
Eosinophils Relative: 9.6 %
HCT: 34.7 % — ABNORMAL LOW (ref 38.5–50.0)
Hemoglobin: 11.3 g/dL — ABNORMAL LOW (ref 13.2–17.1)
MCH: 29.4 pg (ref 27.0–33.0)
MCHC: 32.6 g/dL (ref 32.0–36.0)
MCV: 90.1 fL (ref 80.0–100.0)
MPV: 10.8 fL (ref 7.5–12.5)
Monocytes Relative: 13.2 %
Neutro Abs: 3044 {cells}/uL (ref 1500–7800)
Neutrophils Relative %: 51.6 %
Platelets: 186 10*3/uL (ref 140–400)
RBC: 3.85 10*6/uL — ABNORMAL LOW (ref 4.20–5.80)
RDW: 13.7 % (ref 11.0–15.0)
Total Lymphocyte: 23.6 %
WBC: 5.9 10*3/uL (ref 3.8–10.8)

## 2023-11-05 MED ORDER — HYDROCORTISONE ACETATE 25 MG RE SUPP: RECTAL | 3 refills | Status: DC

## 2023-11-05 MED ORDER — HYDROCORTISONE (PERIANAL) 2.5 % EX CREA: 1.0000 | TOPICAL_CREAM | Freq: Two times a day (BID) | CUTANEOUS | 3 refills | Status: DC

## 2023-11-05 NOTE — Patient Instructions (Addendum)
 Hemorrhoids  Get [employer] - eat at least 4 slices / day for Fiber   Hemorrhoids are swollen veins in and around the rectum or the opening of the butt (anus). There are two types of hemorrhoids: Internal. These occur in the veins just inside the rectum. They may poke through to the outside and become irritated and painful. External. These occur in the veins outside the anus. They can be felt as a painful swelling or hard lump near the anus. Most hemorrhoids do not cause severe problems. Often, they can be treated at home with diet and lifestyle changes. If home treatments do not help, you may need a procedure to shrink or remove the hemorrhoids. What are the causes? Hemorrhoids are caused by pressure near the anus. This pressure may be caused by: Constipation or diarrhea. Straining to poop. Sitting or riding a bike for a long time. Heavy lifting or other things that cause you to strain. Anal sex. What are the signs or symptoms? Symptoms of this condition include: Pain. Anal itching or irritation. Bleeding from the rectum. Leakage of poop (stool). Swelling of the anus. One or more lumps around the anus. How is this diagnosed? Hemorrhoids can often be diagnosed through a visual exam. Other exams or tests may also be done, such as: A digital rectal exam. This is when your health care provider feels inside your rectum with a gloved finger. Anoscope. This is an exam of the anus using a small tube. A blood test, if you have lost a lot of blood. A sigmoidoscopy or colonoscopy. These are tests to look inside the colon using a tube with a camera on the end. How is this treated? In most cases, hemorrhoids can be treated at home with diet and lifestyle changes. If these changes do not help, you may need to have a procedure done. These procedures can make the hemorrhoids smaller or fully remove them. Common procedures include: Rubber band ligation. Rubber bands are placed at the base of  the hemorrhoids to cut off their blood supply. Sclerotherapy. Medicine is put into the hemorrhoids to shrink them. Infrared coagulation. A type of light energy is used to get rid of the hemorrhoids. Hemorrhoidectomy surgery. The hemorrhoids are removed during surgery. Then, the veins that supply them are tied off. Stapled hemorrhoidopexy surgery. The base of the hemorrhoid is stapled to the wall of the rectum. Follow these instructions at home: Medicines Take over-the-counter and prescription medicines only as told by your provider. Use medicated creams or medicines that are put in the rectum (suppositories) as told by your provider. Eating and drinkin Eat foods that are high in fiber, such as beans, whole grains, and fresh fruits and vegetables. Ask your provider about taking products that have fiber added to them (fiber supplements). Reduce the amount of fat in your diet. You can do this by eating low-fat dairy products, eating less red meat, and avoiding processed foods. Drink enough fluid to keep your pee (urine) pale yellow. Managing pain and swelling Take warm sitz baths for 20 minutes, 3-4 times a day. This can help ease pain and discomfort. You may do this in a bathtub or you can use a portable sitz bath that fits over the toilet. If told, put ice on the affected area. It may help to use ice packs between sitz baths. Put ice in a plastic bag. Place a towel between your skin and the bag. Leave the ice on for 20 minutes, 2-3 times a day.  If your skin turns bright red, remove the ice right away to prevent skin damage. The risk of damage is higher if you cannot feel pain, heat, or cold. General instructions Exercise. Ask your provider how much and what kind of exercise is best for you. In general, you should do moderate exercise for at least 30 minutes on most days of the week (150 minutes each week). You may want to try walking, biking, or yoga. Go to the bathroom when you have the urge  to poop. Do not wait. Avoid straining to poop. Keep the anus dry and clean. Use wet toilet paper or moist towelettes after you poop. Do not sit on the toilet for a long time. This can increase blood pooling and pain. Where to find more information [employer] and Digestive [employer] Kidney Diseases: stagesync.si Contact a health care provider if: You have more pain and swelling that do not get better with treatment. You have trouble pooping or you are not able to poop. You have pain or inflammation outside the area of the hemorrhoids. Get help right away if: You are bleeding from your rectum and you cannot get it to stop.

## 2023-11-05 NOTE — Progress Notes (Addendum)
 [place]      ADULT   &   ADOLESCENT      INTERNAL MEDICINE  Elsie Richards, M.D.          Lonell Rous, ANP        Bascom Necessary, FNP  [facility] [address]  [place], [place]. [address] Telephone [phone] Telefax [phone]   Future Appointments  Date Time Provider Department  12/02/2023 10:30 AM Necessary Bascom, NP [facility]  03/01/2024 11:00 AM Richards Elsie, MD [facility]  05/31/2024 10:30 AM Necessary Bascom, NP [facility]    History of Present Illness:                                                This very nice 83 y.o. married Filipino man with HTN, HLD, Prediabetes, Vitamin D  Deficiency and  excision of a colon cancer in 2004, patient presents emergently today with c/o recurrent bright red rectal bleeding & melanotic stools. Last Colonoscopy 07/04/2022 by Dr Sandor Flatter had 2 polyps removed  (reported precancerous tubular adenoma)  and noting pan-diverticulosis of remaining colon and small non bleeding internal hemorrhoids and 5 year f/u was recommended .  On Nov 5 , 2024 , patient was evaluated again for Hematochezia which had resolved & patient declined procedure. On Dec 30,2025 , Patient presented to ER with Large  volume bloody diarrhea followed by 2 normal stools & declined invasive procedure. Hgb was Normal at 13.6 gm% & Abd CT scan showed Colonic diverticulosis with no acute diverticulitis.        Patient presents today with c/o 2 bright red bloody diarrheal BMs during last night . Hgb  dropped from 13.5 gm% to 11.3 gm% today !   Current Outpatient Medications on File Prior to Visit  Medication Sig   aspirin EC 81 MG tablet Take  daily   cetirizine  10 MG tablet TAKE 1 TABLET    diltiazem  240 MG 24 hr  Take 1 capsule every Morning    Folic Acid-Cholecalciferol  Take 2,000 Units  daily.   COMBIVENT  20-100 respimat 1  Inhale  every 4  hours as needed .   montelukast   10 MG tablet TAKE 1 TABLET DAILY    Multiple Vitamin   Take daily.   olmesartan   40 MG tablet Take 1 tablet every night   rosuvastatin  40 MG tablet Take 1 tablet Daily    valsartan   320 MG tablet Daily   WIXELA  250-50 MCG/ACT AEPB USE 1 inhalation 2 x /day - am & pm      Allergies  Allergen Reactions   Mavik [Trandolapril]     Cough   Hctz [Hydrochlorothiazide] Rash    Photosensitivity     Problem list He has Hyperlipidemia; Hypertension; Vitamin D  deficiency; Prediabetes; COPD with asthma (HCC); GERD (gastroesophageal reflux disease); History of colon cancer; BPH (benign prostatic hyperplasia); Medication management; Osteoarthritis of knees, bilateral; and Overweight (BMI 25.0-29.9) on their problem list.   Observations/Objective:  BP 142/80   P 93   T 98 F    R 16   Ht 5' 8)   Wt 178 lb    SpO2 95%   BMI 27.16   HEENT - WNL. Neck - supple.  Chest - Clear equal BS. Cor - Nl HS. RRR w/o sig MGR. PP 1(+). No edema.   Rectal - finds  a small external hemorrhoid at 9 o'clock with a small adjacent hemorrhoidal tag . Appears to have a 1 cm superficial ulcerated area at the 10 to 12 o'clock location ( pictures texted to patient's phone ). No deep fissures seen . DRE - finds normal anal tone & no palpated  internal hemorrhoids or nodular masses at 2-3  depth of examining digit, but exam recovers both Bright Red Bloody Stool with a moderate amount of pasty  thick  Melanotic stool suggesting bleeding from an upper as well as a lower GI source . Hemoccult test was strongly Positive .   MS- FROM w/o deformities. Moderate age related sarcopenia.  Gait Nl. Neuro -  Nl w/o focal abnormalities.  Assessment and Plan:   1. Rectal bleeding (Primary)  - CBC with Differential/Platelet - Hgb dropped from 13.5 gm% to 11.3 gm% today !  -  Request Urgent  GI   Office consultation for Flexible Sigmoidoscopy to                                                  r/o Diverticular vs Hemorrhoidal Bleeding source or Both                                                   -  May ultimately need CT or MR Angiogram of Abdomen  2. Anal fissure, suspected   - hydrocortisone  (PROCTOSOL HC) 2.5 % rectal cream;    Apply 3 to 4 x/ day to external rectal area  ( use latex gloves or finger cots)   Dispense: 90 g; Refill: 3  3. External hemorrhoids, by hx  - hydrocortisone  (PROCTOSOL HC) 2.5 % rectal cream;   Apply 3 to 4 x/ day to external rectal area  (use latex gloves or finger cot)   Dispense: 90 g; Refill: 3  4. Internal hemorrhoids  - hydrocortisone  (ANUSOL -HC) 25 MG suppository;    Insert 1 rectal suppository  4 x /day     Dispense: 36 suppository; Refill: 3   Follow Up Instructions:        I discussed the assessment and treatment plan with the patient. The patient was provided an opportunity to ask questions and all were answered. The patient agreed with the plan and demonstrated an understanding of the instructions.       Patient was given strict ER precautions if  large volume Bleeding re-presents.  The patient was advised to call back or seek an in-person evaluation if the symptoms worsen or if the condition fails to improve as anticipated.    Elsie JONETTA Richards, MD

## 2023-11-06 ENCOUNTER — Telehealth: Payer: Self-pay | Admitting: *Deleted

## 2023-11-06 NOTE — Telephone Encounter (Signed)
 Left message for patient to call back

## 2023-11-06 NOTE — Telephone Encounter (Signed)
-----   Message from Sandor LULLA Flatter sent at 11/06/2023 10:47 AM EST ----- Received a message from this patient's PCP that he had recurrence of hematochezia and 2 g drop in hemoglobin.  Was seen in the ER on 12/30 for this issue and discharged home.  Was seen in the office by PCP yesterday for follow-up with blood on exam.  PCP requesting endoscopic evaluation with both EGD and colonoscopy.  If patient would like to proceed, I am happy to expedite an proceed directly to procedures in the [facility] without need for OV first since he was seen for very similar symptoms back in October.  Alternative option would be scheduling expedited OV with me for hemorrhoid banding, which was also previously discussed with him, but sounds like PCP would very much like repeat endoscopic evaluation first given 2 g drop which is not unreasonable given his history of cancer.  Let me know what he decides.  Thanks.

## 2023-11-08 NOTE — Progress Notes (Signed)
[][][][][][][][][][][][][][][][][][][][][][][][][][][][][][][][][][][][][][][][][]][][][][][][][][][][][][][][][][][][][][][][][[][][][][]  [][][][][][][][][][][][][][][][][][][][][][][][][][][][][][][][][][][][][][][][][]][][][][][][][][][][][][][][][][][][][][][][][[][][][][]  -    Red cell count has dropped down a little - likely due the Rectal Bleeding &   consult / office visit has been requested with  Dr San  - In the mean time if you develop uncontrolled rectal bleeding  or                                             become very light headed or dizziness with standing,                                                                     then you should go to the ER   [] [] [] [] [] [] [] [] [] [] [] [] [] [] [] [] [] [] [] [] [] [] [] [] [] [] [] [] [] [] [] [] [] [] [] [] [] [] [] [] [] ][] [] [] [] [] [] [] [] [] [] [] [] [] [] [] [] [] [] [] [] [] [] [[] [] [] [] []   [] [] [] [] [] [] [] [] [] [] [] [] [] [] [] [] [] [] [] [] [] [] [] [] [] [] [] [] [] [] [] [] [] [] [] [] [] [] [] [] [] ][] [] [] [] [] [] [] [] [] [] [] [] [] [] [] [] [] [] [] [] [] [] [[] [] [] [] []   -

## 2023-11-09 NOTE — Telephone Encounter (Signed)
 I have spoken to patient to discuss scheduling endoscopy/colonoscopy directly for further evaluation of recent hemoglobin drop. Patient does not wish to move forward with endoscopy/colonoscopy at this time as he feels this hemoglobin drop is coming from a tear in the rectum. States Dr Tonita told him he has a tear and he is having bleeding which is causing his problem. Patient would instead like to be seen in the office by Dr San for hemorrhoidal banding as previously discussed. Patient has been scheduled to see Dr San on 11/17/23 at 3 pm. Patient verbalizes understanding.

## 2023-11-17 ENCOUNTER — Ambulatory Visit: Payer: Medicare PPO | Admitting: Gastroenterology

## 2023-11-17 ENCOUNTER — Encounter: Payer: Self-pay | Admitting: Gastroenterology

## 2023-11-17 VITALS — BP 122/80 | HR 80 | Ht 68.0 in | Wt 172.6 lb

## 2023-11-17 DIAGNOSIS — K921 Melena: Secondary | ICD-10-CM | POA: Diagnosis not present

## 2023-11-17 DIAGNOSIS — Z860100 Personal history of colon polyps, unspecified: Secondary | ICD-10-CM

## 2023-11-17 DIAGNOSIS — Z85038 Personal history of other malignant neoplasm of large intestine: Secondary | ICD-10-CM

## 2023-11-17 DIAGNOSIS — K641 Second degree hemorrhoids: Secondary | ICD-10-CM

## 2023-11-17 DIAGNOSIS — K5790 Diverticulosis of intestine, part unspecified, without perforation or abscess without bleeding: Secondary | ICD-10-CM

## 2023-11-17 DIAGNOSIS — Z860101 Personal history of adenomatous and serrated colon polyps: Secondary | ICD-10-CM

## 2023-11-17 LAB — MISCELLANEOUS TEST

## 2023-11-17 NOTE — Progress Notes (Signed)
 Chief Complaint:    Symptomatic Internal Hemorrhoids; Hemorrhoid Band Ligation  GI History: 83 y.o. [name] retired [employer] with a history of HTN, hyperlipidemia, BPH, asthma, and history of colon cancer as below.   Previously followed with Dr. Luis.  History of colon cancer s/p resection, chemotherapy, and radiation 2004.  Limited prior available records as below:   -04/04/2003: Partial colectomy and appendectomy with path demonstrating moderately differentiated rectal adenocarcinoma with deep invasion of muscularis propria and metastasis of 1/8 perirectal lymph nodes - 03/17/2011: Colonoscopy (Dr. Luis): No report available for review, but pathology report reviewed (mucosal prolapse polyp) and recommended repeat in 5 years  - 03/17/2016: Colonoscopy (Dr. Luis): Pandiverticulosis, internal hemorrhoids.  Repeat in 5 years   - 06/12/2022: Initial appointment in the [place] [facility] to transfer care.  No GI symptoms. - 07/04/2022: Colonoscopy: 4 mm cecal polyp (path benign), 5 mm descending colon adenoma, pandiverticulosis, healthy-appearing anastomosis in the rectosigmoid, small internal hemorrhoids.  Normal TI.  HPI:     Patient is a [age] y.o. malewith a history of symptomatic internal hemorrhoids presenting to the [facility]ology Clinic for follow-up and ongoing treatment.   Reports onset of large-volume painless hematochezia on 11/02/2023.  He went to the ER and was initially hypotensive, but quickly responsive to IV fluids.  Does describe some associated lightheadedness which resolved after IV fluids.  Bleeding eventually resolved and was discharged home.  ER evaluation was notable for FOBT positive, normal CMP, normal PT/INR.  In the ER, H/H was normal at 13.5/43. CT A/P on 12/30 with small hiatal hernia, cholelithiasis without cholecystitis, diverticulosis, but otherwise no acute intra-abdominal pathology.  He was discharged home and had follow-up with PCM on 11/05/2023, at which time H/H noted to  have down trended by 2 g at 11.3/34.7.  Bleeding has not recurred since then.  He is interested in hemorrhoid banding today for further treatment of symptomatic hemorrhoidal disease.    Review of systems:     No chest pain, no SOB, no fevers, no urinary sx   Past Medical History:  Diagnosis Date  . Allergy   . Asthma   . BPH (benign prostatic hyperplasia)   . GERD (gastroesophageal reflux disease)   . History of colon cancer   . Hyperlipidemia   . Hypertension   . Prediabetes   . Vitamin D  deficiency     Patient's surgical history, family medical history, social history, medications and allergies were all reviewed in Epic    Current Outpatient Medications  Medication Sig Dispense Refill  . aspirin EC 81 MG tablet Take 81 mg by mouth daily. Swallow whole.    . cetirizine  (ZYRTEC ) 10 MG tablet TAKE 1 TABLET DAILY FOR ALLERGIES                                                             /                                                                   TAKE  BY                                                 MOUTH 90 tablet 3  . diltiazem  (TIAZAC ) 240 MG 24 hr capsule Take 1 capsule every Morning for BP 90 capsule 3  . Folic Acid-Cholecalciferol (CHOLECAL DF PO) Take 2,000 Units by mouth daily.    . hydrocortisone  (ANUSOL -HC) 25 MG suppository Insert 1 rectal suppository  4 x /day 36 suppository 3  . hydrocortisone  (PROCTOSOL HC) 2.5 % rectal cream Place 1 Application rectally 2 (two) times daily. Apply 3 to 4 x/ day to external rectal area  ( use latex gloves or finger cots) 90 g 3  . Ipratropium-Albuterol  (COMBIVENT  RESPIMAT) 20-100 MCG/ACT AERS respimat Inhale 1 puff into the lungs every 4 (four) hours as needed for wheezing. 4 g 4  . montelukast  (SINGULAIR ) 10 MG tablet TAKE 1 TABLET DAILY FOR ALLERGY AND ASTHMA 90 tablet 3  . Multiple Vitamin (MULTIVITAMIN PO) Take by mouth daily.    . olmesartan  (BENICAR ) 40 MG tablet Take 1 tablet every  night for BP 90 tablet 3  . rosuvastatin  (CRESTOR ) 40 MG tablet Take 1 tablet Daily for Cholesterol 90 tablet 3  . valsartan  (DIOVAN ) 320 MG tablet 320 mg.    . WIXELA INHUB 250-50 MCG/ACT AEPB USE 1 INHALATION IN THE MORNING AND AT BEDTIME 180 each 3   No current facility-administered medications for this visit.    Physical Exam:     BP 122/80   Pulse 80   Ht 5' 8 (1.727 m)   Wt 172 lb 9.6 oz (78.3 kg)   BMI 26.24 kg/m   GENERAL:  Pleasant [name] in NAD PSYCH: : Cooperative, normal affect NEURO: Alert and oriented x 3, no focal neurologic deficits Rectal exam: Sensation intact and preserved anal wink.  Grade 2 hemorrhoids noted in RP and LL positions and grade 1 hemorrhoid column in RA position on anoscopy.  No external anal fissures noted. Normal sphincter tone. No palpable mass. No blood on the exam glove. (Chaperone: Nat Sic, CMA).   IMPRESSION and PLAN:    #1.  Symptomatic internal hemorrhoids #2.  Hematochezia While he does have internal hemorrhoids which have been symptomatic in the past, his clinical description of events on 12/30 seem more consistent with acute, self-limiting diverticular bleed given the volume of blood loss, associated hypotension/lightheadedness, and 2 g decline in hemoglobin.  Thankfully symptoms have not recurred.  For his hemorrhoids, he would like to proceed with hemorrhoid banding.  Will address diverticular disease further as below.   PROCEDURE NOTE: The patient presents with symptomatic grade 2 hemorrhoids, unresponsive to maximal medical therapy, requesting rubber band ligation of symptomatic hemorrhoidal disease.  All risks, benefits and alternative forms of therapy were described and informed consent was obtained.  In the Left Lateral Decubitus position, anoscopic examination revealed grade 2 hemorrhoids in the RP and LL positions, and grade 1 hemorrhoid in RA position.   The anorectum was pre-medicated with RectiCare. The decision was  made to band the RP internal hemorrhoid, and the [facility] O'Regan System was used to perform band ligation without complication.  Digital anorectal examination was then performed to assure proper positioning of the band, and to adjust the banded tissue as required.  The patient was discharged home without pain or other issues.  Dietary and  behavioral recommendations were given and along with follow-up instructions.     The following adjunctive treatments were recommended:  -Resume high-fiber diet with fiber supplement (i.e. Citrucel or Benefiber) with goal for soft stools without straining to have a BM. -Resume adequate fluid intake.  The patient will return in 4+ weeks for follow-up and banding of the LL hemorrhoid column. No complications were encountered and the patient tolerated the procedure well.      #3.  History of colon cancer #4.  History of colon polyps History of colon cancer s/p resection, chemotherapy, and radiation 2004.  Last colonoscopy in 2023 with a single subcentimeter adenoma, otherwise healthy appearing anastomosis and no evidence of recurrence.  Discussed role/utility of repeat colonoscopy after 5 years for ongoing surveillance.  He would be age 64 at that point, so this would certainly be done with strong consideration of overall health and patient wishes rather than scheduling directly for procedure.  #5. Diverticulosis If recurrence of large volume hematochezia, recommend going to the ER for expedited eval to include CBC, and stat CT Angiography for suspected overlapping diverticular bleed.   I spent an additional 25 minutes of nonprocedural time, including independent review of results as outlined above, communicating results with the patient directly, face-to-face time with the patient, coordinating care, ordering studies and medications as appropriate, and documentation.         Sandor LULLA Flatter ,DO, FACG 11/17/2023, 3:19 PM

## 2023-11-17 NOTE — Patient Instructions (Signed)
 _______________________________________________________  If your blood pressure at your visit was 140/90 or greater, please contact your primary care physician to follow up on this. _______________________________________________________  If you are age 83 or older, your body mass index should be between 23-30. Your Body mass index is 26.24 kg/m. If this is out of the aforementioned range listed, please consider follow up with your Primary Care Provider. ________________________________________________________  The [place] [facility] providers would like to encourage you to use MYCHART to communicate with providers for non-urgent requests or questions.  Due to long hold times on the telephone, sending your provider a message by [facility] may be a faster and more efficient way to get a response.  Please allow 48 business hours for a response.  Please remember that this is for non-urgent requests.  _______________________________________________________  Jon [name] PROCEDURE    FOLLOW-UP CARE   The procedure you have had should have been relatively painless since the banding of the area involved does not have nerve endings and there is no pain sensation.  The rubber band cuts off the blood supply to the hemorrhoid and the band may fall off as soon as 48 hours after the banding (the band may occasionally be seen in the toilet bowl following a bowel movement). You may notice a temporary feeling of fullness in the rectum which should respond adequately to plain Tylenol or Motrin.  Following the banding, avoid strenuous exercise that evening and resume full activity the next day.  A sitz bath (soaking in a warm tub) or bidet is soothing, and can be useful for cleansing the area after bowel movements.     To avoid constipation, take two tablespoons of natural wheat bran, natural oat bran, flax, Benefiber or any over the counter fiber supplement and increase your water intake to 7-8 glasses daily.     Unless you have been prescribed anorectal medication, do not put anything inside your rectum for two weeks: No suppositories, enemas, fingers, etc.  Occasionally, you may have more bleeding than usual after the banding procedure.  This is often from the untreated hemorrhoids rather than the treated one.  Don't be concerned if there is a tablespoon or so of blood.  If there is more blood than this, lie flat with your bottom higher than your head and apply an ice pack to the area. If the bleeding does not stop within a half an hour or if you feel faint, call our office at [phone] or go to the emergency room.  Problems are not common; however, if there is a substantial amount of bleeding, severe pain, chills, fever or difficulty passing urine (very rare) or other problems, you should call us  at [phone] or report to the nearest emergency room.  Do not stay seated continuously for more than 2-3 hours for a day or two after the procedure.  Tighten your buttock muscles 10-15 times every two hours and take 10-15 deep breaths every 1-2 hours.  Do not spend more than a few minutes on the toilet if you cannot empty your bowel; instead re-visit the toilet at a later time.   It was a pleasure to see you today!  Vito Cirigliano, D.O.

## 2023-12-02 ENCOUNTER — Ambulatory Visit: Payer: Medicare PPO | Admitting: Nurse Practitioner

## 2023-12-10 ENCOUNTER — Other Ambulatory Visit: Payer: Self-pay | Admitting: Internal Medicine

## 2023-12-10 DIAGNOSIS — I10 Essential (primary) hypertension: Secondary | ICD-10-CM

## 2023-12-15 ENCOUNTER — Other Ambulatory Visit: Payer: Self-pay

## 2023-12-15 DIAGNOSIS — I10 Essential (primary) hypertension: Secondary | ICD-10-CM

## 2023-12-15 MED ORDER — VALSARTAN 320 MG PO TABS: 320.0000 mg | ORAL_TABLET | Freq: Every day | ORAL | 0 refills | Status: DC

## 2024-01-01 ENCOUNTER — Encounter: Payer: Self-pay | Admitting: Gastroenterology

## 2024-01-01 ENCOUNTER — Ambulatory Visit: Payer: Medicare PPO | Admitting: Gastroenterology

## 2024-01-01 VITALS — BP 146/90 | HR 80 | Ht 68.0 in | Wt 175.1 lb

## 2024-01-01 DIAGNOSIS — K641 Second degree hemorrhoids: Secondary | ICD-10-CM | POA: Diagnosis not present

## 2024-01-01 NOTE — Patient Instructions (Addendum)
 _______________________________________________________  If your blood pressure at your visit was 140/90 or greater, please contact your primary care physician to follow up on this. _______________________________________________________  If you are age 83 or older, your body mass index should be between 23-30. Your Body mass index is 26.63 kg/m. If this is out of the aforementioned range listed, please consider follow up with your Primary Care Provider. ________________________________________________________  The [place] [facility] providers would like to encourage you to use [facility] to communicate with providers for non-urgent requests or questions.  Due to long hold times on the telephone, sending your provider a message by [facility] may be a faster and more efficient way to get a response.  Please allow 48 business hours for a response.  Please remember that this is for non-urgent requests.  _______________________________________________________  [place] PROCEDURE    FOLLOW-UP CARE   The procedure you have had should have been relatively painless since the banding of the area involved does not have nerve endings and there is no pain sensation.  The rubber band cuts off the blood supply to the hemorrhoid and the band may fall off as soon as 48 hours after the banding (the band may occasionally be seen in the toilet bowl following a bowel movement). You may notice a temporary feeling of fullness in the rectum which should respond adequately to plain Tylenol or Motrin.  Following the banding, avoid strenuous exercise that evening and resume full activity the next day.  A sitz bath (soaking in a warm tub) or bidet is soothing, and can be useful for cleansing the area after bowel movements.     To avoid constipation, take two tablespoons of natural wheat bran, natural oat bran, flax, Benefiber or any over the counter fiber supplement and increase your water intake to 7-8 glasses daily.     Unless you have been prescribed anorectal medication, do not put anything inside your rectum for two weeks: No suppositories, enemas, fingers, etc.  Occasionally, you may have more bleeding than usual after the banding procedure.  This is often from the untreated hemorrhoids rather than the treated one.  Don't be concerned if there is a tablespoon or so of blood.  If there is more blood than this, lie flat with your bottom higher than your head and apply an ice pack to the area. If the bleeding does not stop within a half an hour or if you feel faint, call our office at [phone] or go to the emergency room.  Problems are not common; however, if there is a substantial amount of bleeding, severe pain, chills, fever or difficulty passing urine (very rare) or other problems, you should call us at [phone] or report to the nearest emergency room.  Do not stay seated continuously for more than 2-3 hours for a day or two after the procedure.  Tighten your buttock muscles 10-15 times every two hours and take 10-15 deep breaths every 1-2 hours.  Do not spend more than a few minutes on the toilet if you cannot empty your bowel; instead re-visit the toilet at a later time.   It was a pleasure to see you today!  Vito Cirigliano, D.O.

## 2024-01-01 NOTE — Progress Notes (Signed)
 Chief Complaint:    Symptomatic Internal Hemorrhoids; Hemorrhoid Band Ligation  GI History: 83 y.o. male retired C[employer] with a history of HTN, hyperlipidemia, BPH, asthma, and history of colon cancer as below.   Previously followed with Dr. Kinnie Scales.  History of colon cancer s/p resection, chemotherapy, and radiation 2004.  Limited prior available records as below:   -04/04/2003: Partial colectomy and appendectomy with path demonstrating moderately differentiated rectal adenocarcinoma with deep invasion of muscularis propria and metastasis of 1/8 perirectal lymph nodes - 03/17/2011: Colonoscopy (Dr. Kinnie Scales): No report available for review, but pathology report reviewed (mucosal prolapse polyp) and recommended repeat in 5 years  - 03/17/2016: Colonoscopy (Dr. Kinnie Scales): Pandiverticulosis, internal hemorrhoids.  Repeat in 5 years   - 06/12/2022: Initial appointment in the [place] [facility] clinic to transfer care.  No GI symptoms. - 07/04/2022: Colonoscopy: 4 mm cecal polyp (path benign), 5 mm descending colon adenoma, pandiverticulosis, healthy-appearing anastomosis in the rectosigmoid, small internal hemorrhoids.  Normal TI. - 11/17/2023: Banding of RP hemorrhoid  HPI:     Patient is a 83 y.o. malewith a history of symptomatic internal hemorrhoids presenting to the Gastroenterology Clinic for follow-up and ongoing treatment. The patient presents with symptomatic grade 2 hemorrhoids, unresponsive to maximal medical therapy, requesting rubber band ligation of symptomatic hemorrhoidal disease.  Did well with first hemorrhoid banding and has noticed significant improvement in hematochezia.  No change in medical or surgical history, medications, allergies, social history since last appointment with me.   Review of systems:     No chest pain, no SOB, no fevers, no urinary sx   Past Medical History:  Diagnosis Date   Allergy    Asthma    BPH (benign prostatic hyperplasia)    GERD (gastroesophageal reflux  disease)    History of colon cancer    Hyperlipidemia    Hypertension    Prediabetes    Vitamin D deficiency     Patient's surgical history, family medical history, social history, medications and allergies were all reviewed in Epic    Current Outpatient Medications  Medication Sig Dispense Refill   aspirin EC 81 MG tablet Take 81 mg by mouth daily. Swallow whole.     cetirizine (ZYRTEC) 10 MG tablet TAKE 1 TABLET DAILY FOR ALLERGIES                                                             /                                                                   TAKE                                         BY  MOUTH 90 tablet 3   diltiazem (TIAZAC) 240 MG 24 hr capsule TAKE 1 CAPSULE EVERY MORNING FOR BLOOD PRESSURE 90 capsule 3   Folic Acid-Cholecalciferol (CHOLECAL DF PO) Take 2,000 Units by mouth daily.     Ipratropium-Albuterol (COMBIVENT RESPIMAT) 20-100 MCG/ACT AERS respimat Inhale 1 puff into the lungs every 4 (four) hours as needed for wheezing. 4 g 4   montelukast (SINGULAIR) 10 MG tablet TAKE 1 TABLET DAILY FOR ALLERGY AND ASTHMA 90 tablet 3   Multiple Vitamin (MULTIVITAMIN PO) Take by mouth daily.     olmesartan (BENICAR) 40 MG tablet Take 1 tablet every night for BP 90 tablet 3   rosuvastatin (CRESTOR) 40 MG tablet Take 1 tablet Daily for Cholesterol 90 tablet 3   valsartan (DIOVAN) 320 MG tablet Take 1 tablet (320 mg total) by mouth daily. 90 tablet 0   WIXELA INHUB 250-50 MCG/ACT AEPB USE 1 INHALATION IN THE MORNING AND AT BEDTIME 180 each 3   No current facility-administered medications for this visit.    Physical Exam:     BP (!) 146/90   Pulse 80   Ht 5\' 8"  (1.727 m)   Wt 175 lb 2 oz (79.4 kg)   BMI 26.63 kg/m   GENERAL:  Pleasant male in NAD PSYCH: : Cooperative, normal affect NEURO: Alert and oriented x 3, no focal neurologic deficits Rectal exam: Sensation intact and preserved anal wink.  Grade 2 hemorrhoids  noted in all positions on anoscopy.  No external anal fissures noted. Normal sphincter tone. No palpable mass. No blood on the exam glove. (Chaperone: Thompson Grayer, CMA).   IMPRESSION and PLAN:    #1.  Symptomatic internal hemorrhoids: PROCEDURE NOTE: The patient presents with symptomatic grade 2 hemorrhoids, unresponsive to maximal medical therapy, requesting rubber band ligation of symptomatic hemorrhoidal disease.  All risks, benefits and alternative forms of therapy were described and informed consent was obtained.  In the Left Lateral Decubitus position, anoscopic examination revealed grade 2 hemorrhoids in the LL position(s).  The anorectum was pre-medicated with RectiCare. The decision was made to band the LL internal hemorrhoid, and the [facility] O'Regan System was used to perform band ligation without complication.  Digital anorectal examination was then performed to assure proper positioning of the band, and to adjust the banded tissue as required.  The patient was discharged home without pain or other issues.  Dietary and behavioral recommendations were given and along with follow-up instructions.     The following adjunctive treatments were recommended:  -Resume high-fiber diet with fiber supplement (i.e. Citrucel or Benefiber) with goal for soft stools without straining to have a BM. -Resume adequate fluid intake.  The patient will return as needed if return of hemorrhoidal symptoms for reevaluation and possible additional banding as required. No complications were encountered and the patient tolerated the procedure well.    #2.  History of colon cancer #3. History of colon polyps History of colon cancer s/p resection, chemotherapy, and radiation 2004.  Last colonoscopy in 2023 with a single subcentimeter adenoma, otherwise healthy appearing anastomosis and no evidence of recurrence.  Previously discussed role/utility of repeat colonoscopy after 5 years for ongoing surveillance.  He  would be age 6 at that point, so this would certainly be done with strong consideration of overall health and patient wishes rather than scheduling directly for procedure.  #4. Diverticulosis If recurrence of large volume hematochezia, recommend going to the ER for expedited eval to include CBC, and stat CT Angiography for suspected  overlapping diverticular bleed.       Verlin Dike Sunjai Levandoski ,DO, FACG 01/01/2024, 10:40 AM

## 2024-01-18 ENCOUNTER — Other Ambulatory Visit: Payer: Self-pay

## 2024-01-18 MED ORDER — FLUTICASONE-SALMETEROL 250-50 MCG/ACT IN AEPB: 1.0000 | INHALATION_SPRAY | Freq: Two times a day (BID) | RESPIRATORY_TRACT | 0 refills | Status: DC

## 2024-01-20 ENCOUNTER — Ambulatory Visit: Payer: Medicare PPO | Admitting: Gastroenterology

## 2024-03-01 ENCOUNTER — Encounter: Payer: Medicare PPO | Admitting: Internal Medicine

## 2024-03-09 ENCOUNTER — Ambulatory Visit (INDEPENDENT_AMBULATORY_CARE_PROVIDER_SITE_OTHER): Payer: Medicare PPO | Admitting: Internal Medicine

## 2024-03-09 ENCOUNTER — Encounter: Payer: Self-pay | Admitting: Internal Medicine

## 2024-03-09 VITALS — BP 120/80 | HR 70 | Temp 97.8°F | Ht 67.5 in | Wt 175.0 lb

## 2024-03-09 DIAGNOSIS — E782 Mixed hyperlipidemia: Secondary | ICD-10-CM | POA: Diagnosis not present

## 2024-03-09 DIAGNOSIS — E559 Vitamin D deficiency, unspecified: Secondary | ICD-10-CM | POA: Diagnosis not present

## 2024-03-09 DIAGNOSIS — J4530 Mild persistent asthma, uncomplicated: Secondary | ICD-10-CM

## 2024-03-09 DIAGNOSIS — I10 Essential (primary) hypertension: Secondary | ICD-10-CM | POA: Diagnosis not present

## 2024-03-09 DIAGNOSIS — J4489 Other specified chronic obstructive pulmonary disease: Secondary | ICD-10-CM

## 2024-03-09 DIAGNOSIS — R7303 Prediabetes: Secondary | ICD-10-CM | POA: Diagnosis not present

## 2024-03-09 DIAGNOSIS — Z85038 Personal history of other malignant neoplasm of large intestine: Secondary | ICD-10-CM

## 2024-03-09 MED ORDER — DILTIAZEM HCL ER BEADS 240 MG PO CP24
ORAL_CAPSULE | ORAL | 3 refills | Status: AC
Start: 2024-03-09 — End: ?

## 2024-03-09 MED ORDER — VALSARTAN 320 MG PO TABS: 320.0000 mg | ORAL_TABLET | Freq: Every day | ORAL | 3 refills | Status: AC

## 2024-03-09 MED ORDER — FLUTICASONE-SALMETEROL 250-50 MCG/ACT IN AEPB: 1.0000 | INHALATION_SPRAY | Freq: Two times a day (BID) | RESPIRATORY_TRACT | 0 refills | Status: AC

## 2024-03-09 MED ORDER — COMBIVENT RESPIMAT 20-100 MCG/ACT IN AERS: 1.0000 | INHALATION_SPRAY | RESPIRATORY_TRACT | 4 refills | Status: AC | PRN

## 2024-03-09 MED ORDER — CETIRIZINE HCL 10 MG PO TABS: ORAL_TABLET | ORAL | 3 refills | Status: AC

## 2024-03-09 MED ORDER — MONTELUKAST SODIUM 10 MG PO TABS
ORAL_TABLET | ORAL | 3 refills | Status: AC
Start: 2024-03-09 — End: ?

## 2024-03-09 MED ORDER — ROSUVASTATIN CALCIUM 40 MG PO TABS: ORAL_TABLET | ORAL | 3 refills | Status: AC

## 2024-03-09 NOTE — Progress Notes (Signed)
 New Patient Office Visit     CC/Reason for Visit: Establish care, discuss chronic medical conditions, medication refills. Previous PCP: Dr. Vangie Genet Last Visit: January 2025  HPI: Jon Schneider Jr. is a 83 y.o. male who is coming in today for the above mentioned reasons. Past Medical History is significant for: Hypertension, hyperlipidemia, impaired glucose tolerance, vitamin D  deficiency, COPD with asthma, history of colon cancer.  He is feeling well.  No acute concerns other than he is in need of medication refills.   Past Medical/Surgical History: Past Medical History:  Diagnosis Date   Allergy    Asthma    BPH (benign prostatic hyperplasia)    GERD (gastroesophageal reflux disease)    History of colon cancer    Hyperlipidemia    Hypertension    Prediabetes    Vitamin D  deficiency     Past Surgical History:  Procedure Laterality Date   COLON SURGERY  2004   Dr Nolon Baxter    Social History:  reports that he has never smoked. He has never used smokeless tobacco. He reports current alcohol use of about 7.0 standard drinks of alcohol per week. He reports that he does not use drugs.  Allergies: Allergies  Allergen Reactions   Mavik [Trandolapril]     Cough   Hctz [Hydrochlorothiazide] Rash    Photosensitivity    Family History:  Family History  Problem Relation Age of Onset   Heart disease Mother    Cancer Father    Cancer Sister    Breast cancer Sister    Autoimmune disease Brother    Crohn's disease Daughter    Colon cancer Cousin        Second cousin on dad side   Esophageal cancer Neg Hx    Rectal cancer Neg Hx    Stomach cancer Neg Hx      Current Outpatient Medications:    aspirin EC 81 MG tablet, Take 81 mg by mouth daily. Swallow whole., Disp: , Rfl:    Multiple Vitamin (MULTIVITAMIN PO), Take by mouth daily., Disp: , Rfl:    Omega-3 Fatty Acids (OMEGA-3 FISH OIL PO), Take by mouth., Disp: , Rfl:    cetirizine  (ZYRTEC ) 10 MG tablet,  TAKE 1 TABLET DAILY FOR ALLERGIES                                                             /                                                                   TAKE                                         BY  MOUTH, Disp: 90 tablet, Rfl: 3   diltiazem  (TIAZAC ) 240 MG 24 hr capsule, TAKE 1 CAPSULE EVERY MORNING FOR BLOOD PRESSURE, Disp: 90 capsule, Rfl: 3   fluticasone -salmeterol (WIXELA INHUB) 250-50 MCG/ACT AEPB, Inhale 1 puff into the lungs in the morning and at bedtime., Disp: 180 each, Rfl: 0   Folic Acid-Cholecalciferol (CHOLECAL DF PO), Take 2,000 Units by mouth daily. (Patient not taking: Reported on 03/09/2024), Disp: , Rfl:    Ipratropium-Albuterol  (COMBIVENT  RESPIMAT) 20-100 MCG/ACT AERS respimat, Inhale 1 puff into the lungs every 4 (four) hours as needed for wheezing., Disp: 4 g, Rfl: 4   montelukast  (SINGULAIR ) 10 MG tablet, TAKE 1 TABLET DAILY FOR ALLERGY AND ASTHMA, Disp: 90 tablet, Rfl: 3   rosuvastatin  (CRESTOR ) 40 MG tablet, Take 1 tablet Daily for Cholesterol, Disp: 90 tablet, Rfl: 3   valsartan  (DIOVAN ) 320 MG tablet, Take 1 tablet (320 mg total) by mouth daily., Disp: 90 tablet, Rfl: 3  Review of Systems:  Negative except as indicated in HPI.   Physical Exam: Vitals:   03/09/24 1309  BP: 120/80  Pulse: 70  Temp: 97.8 F (36.6 C)  TempSrc: Oral  SpO2: 95%  Weight: 175 lb (79.4 kg)  Height: 5' 7.5" (1.715 m)   Body mass index is 27 kg/m.  Physical Exam Vitals reviewed.  Constitutional:      Appearance: Normal appearance.  HENT:     Head: Normocephalic and atraumatic.  Eyes:     Conjunctiva/sclera: Conjunctivae normal.  Cardiovascular:     Rate and Rhythm: Normal rate and regular rhythm.  Pulmonary:     Effort: Pulmonary effort is normal.     Breath sounds: Normal breath sounds.  Skin:    General: Skin is warm and dry.  Neurological:     General: No focal deficit present.     Mental Status: He is alert and  oriented to person, place, and time.  Psychiatric:        Mood and Affect: Mood normal.        Behavior: Behavior normal.        Thought Content: Thought content normal.        Judgment: Judgment normal.       Impression and Plan:  Mixed hyperlipidemia  Primary hypertension  Prediabetes  Vitamin D  deficiency  COPD with asthma (HCC) -     Cetirizine  HCl; TAKE 1 TABLET DAILY FOR ALLERGIES                                                             /                                                                   TAKE                                         BY  MOUTH  Dispense: 90 tablet; Refill: 3 -     Fluticasone -Salmeterol; Inhale 1 puff into the lungs in the morning and at bedtime.  Dispense: 180 each; Refill: 0 -     Montelukast  Sodium; TAKE 1 TABLET DAILY FOR ALLERGY AND ASTHMA  Dispense: 90 tablet; Refill: 3  History of colon cancer  Essential hypertension -     dilTIAZem  HCl ER Beads; TAKE 1 CAPSULE EVERY MORNING FOR BLOOD PRESSURE  Dispense: 90 capsule; Refill: 3 -     Valsartan ; Take 1 tablet (320 mg total) by mouth daily.  Dispense: 90 tablet; Refill: 3  Mild persistent asthma without complication -     Combivent  Respimat; Inhale 1 puff into the lungs every 4 (four) hours as needed for wheezing.  Dispense: 4 g; Refill: 4  Hyperlipidemia, mixed -     Rosuvastatin  Calcium ; Take 1 tablet Daily for Cholesterol  Dispense: 90 tablet; Refill: 3    -Blood pressure is well-controlled, refill Diovan  and diltiazem . - Lipids were at goal at last check, continue rosuvastatin  40 mg. - COPD is at baseline, no ongoing shortness of breath, continue inhalers.  Time spent: 46 minutes reviewing chart, interviewing and examining patient and formulating plan of care        Dreyson Mishkin Zilphia Hilt, MD [facility] at [facility]

## 2024-05-20 ENCOUNTER — Encounter: Payer: Self-pay | Admitting: Advanced Practice Midwife

## 2024-05-30 ENCOUNTER — Ambulatory Visit: Payer: Medicare PPO | Admitting: Nurse Practitioner

## 2024-05-31 ENCOUNTER — Ambulatory Visit: Payer: Medicare PPO | Admitting: Nurse Practitioner

## 2024-06-23 ENCOUNTER — Encounter (HOSPITAL_BASED_OUTPATIENT_CLINIC_OR_DEPARTMENT_OTHER): Payer: Self-pay

## 2024-06-23 ENCOUNTER — Telehealth: Payer: Self-pay | Admitting: Gastroenterology

## 2024-06-23 ENCOUNTER — Emergency Department (HOSPITAL_BASED_OUTPATIENT_CLINIC_OR_DEPARTMENT_OTHER)
Admission: EM | Admit: 2024-06-23 | Discharge: 2024-06-23 | Disposition: A | Discharge status: Home / Self Care | Attending: Emergency Medicine | Admitting: Emergency Medicine

## 2024-06-23 ENCOUNTER — Other Ambulatory Visit: Payer: Self-pay

## 2024-06-23 DIAGNOSIS — Z7951 Long term (current) use of inhaled steroids: Secondary | ICD-10-CM | POA: Diagnosis not present

## 2024-06-23 DIAGNOSIS — K922 Gastrointestinal hemorrhage, unspecified: Secondary | ICD-10-CM | POA: Diagnosis not present

## 2024-06-23 DIAGNOSIS — K625 Hemorrhage of anus and rectum: Secondary | ICD-10-CM | POA: Diagnosis not present

## 2024-06-23 DIAGNOSIS — K921 Melena: Secondary | ICD-10-CM

## 2024-06-23 DIAGNOSIS — Z7982 Long term (current) use of aspirin: Secondary | ICD-10-CM | POA: Insufficient documentation

## 2024-06-23 DIAGNOSIS — J45909 Unspecified asthma, uncomplicated: Secondary | ICD-10-CM | POA: Diagnosis not present

## 2024-06-23 DIAGNOSIS — Z79899 Other long term (current) drug therapy: Secondary | ICD-10-CM | POA: Diagnosis not present

## 2024-06-23 DIAGNOSIS — I10 Essential (primary) hypertension: Secondary | ICD-10-CM | POA: Insufficient documentation

## 2024-06-23 LAB — CBC
HCT: 41.7 % (ref 39.0–52.0)
Hemoglobin: 13.7 g/dL (ref 13.0–17.0)
MCH: 29.7 pg (ref 26.0–34.0)
MCHC: 32.9 g/dL (ref 30.0–36.0)
MCV: 90.5 fL (ref 80.0–100.0)
Platelets: 194 K/uL (ref 150–400)
RBC: 4.61 MIL/uL (ref 4.22–5.81)
RDW: 14.2 % (ref 11.5–15.5)
WBC: 7.7 K/uL (ref 4.0–10.5)
nRBC: 0 % (ref 0.0–0.2)

## 2024-06-23 LAB — COMPREHENSIVE METABOLIC PANEL WITH GFR
ALT: 36 U/L (ref 0–44)
AST: 36 U/L (ref 15–41)
Albumin: 4.1 g/dL (ref 3.5–5.0)
Alkaline Phosphatase: 134 U/L — ABNORMAL HIGH (ref 38–126)
Anion gap: 11 (ref 5–15)
BUN: 20 mg/dL (ref 8–23)
CO2: 27 mmol/L (ref 22–32)
Calcium: 9.3 mg/dL (ref 8.9–10.3)
Chloride: 104 mmol/L (ref 98–111)
Creatinine, Ser: 1.25 mg/dL — ABNORMAL HIGH (ref 0.61–1.24)
GFR, Estimated: 57 mL/min — ABNORMAL LOW (ref >60)
Glucose, Bld: 86 mg/dL (ref 70–99)
Potassium: 4.2 mmol/L (ref 3.5–5.1)
Sodium: 142 mmol/L (ref 135–145)
Total Bilirubin: 0.3 mg/dL (ref 0.0–1.2)
Total Protein: 7.2 g/dL (ref 6.5–8.1)

## 2024-06-23 NOTE — Telephone Encounter (Signed)
 Inbound call from patient wanting to inform that he was at the ED for hematochezia and rectal bleeding. Patient is requesting a call from nurse to discuss further. Please advise, thank you

## 2024-06-23 NOTE — Telephone Encounter (Signed)
Lm on home vm for patient to return call.  

## 2024-06-23 NOTE — ED Provider Notes (Signed)
 [place] EMERGENCY DEPARTMENT AT [facility] Provider Note  CSN: [id number] Arrival date & time: 06/23/24 9661  Chief Complaint(s) Rectal Bleeding  HPI Jon Due Jr. is a 83 y.o. male with a past medical history listed below including pandiverticulosis followed by gastroenterology here for 3 days of hematochezia.  Bleeding began Friday.  He reports he had 3 bloody bowel movements then.  Since then he has changed his diet, increasing his fiber intake and decrease in solid food intake.  Since then he has had 1 bloody bowel movement per day.  This evening when he had another bloody bowel movement around midnight he chose to present to the emergency department.  He denies any abdominal pain but does report bloating.  No nausea or vomiting.  No fevers or chills.  No other physical complaint.  Patient is not anticoagulated but does take a baby aspirin daily.  The history is provided by the patient.    Past Medical History Past Medical History:  Diagnosis Date   Allergy    Asthma    BPH (benign prostatic hyperplasia)    GERD (gastroesophageal reflux disease)    History of colon cancer    Hyperlipidemia    Hypertension    Prediabetes    Vitamin D  deficiency    Patient Active Problem List   Diagnosis Date Noted   Overweight (BMI 25.0-29.9) 04/22/2018   Osteoarthritis of knees, bilateral 06/24/2017   Medication management 02/16/2014   Hyperlipidemia    Hypertension    Vitamin D  deficiency    Prediabetes    COPD with asthma (HCC)    GERD (gastroesophageal reflux disease)    History of colon cancer    BPH (benign prostatic hyperplasia)    Home Medication(s) Prior to Admission medications   Medication Sig Start Date End Date Taking? Authorizing Provider  aspirin EC 81 MG tablet Take 81 mg by mouth daily. Swallow whole.    [provider]  cetirizine  (ZYRTEC ) 10 MG tablet TAKE 1 TABLET DAILY FOR ALLERGIES                                                              /                                                                   TAKE                                         BY                                                 MOUTH 03/09/24   Theophilus Andrews, Tully GRADE, MD  diltiazem  (TIAZAC ) 240 MG 24 hr capsule TAKE 1 CAPSULE EVERY MORNING FOR BLOOD PRESSURE 03/09/24   Theophilus Andrews, Tully GRADE, MD  fluticasone -salmeterol GARETH INHUB)  250-50 MCG/ACT AEPB Inhale 1 puff into the lungs in the morning and at bedtime. 03/09/24   Theophilus Andrews, Tully GRADE, MD  Folic Acid-Cholecalciferol (CHOLECAL DF PO) Take 2,000 Units by mouth daily. Patient not taking: Reported on 03/09/2024    [provider]  Ipratropium-Albuterol  (COMBIVENT  RESPIMAT) 20-100 MCG/ACT AERS respimat Inhale 1 puff into the lungs every 4 (four) hours as needed for wheezing. 03/09/24   Theophilus Andrews, Tully GRADE, MD  montelukast  (SINGULAIR ) 10 MG tablet TAKE 1 TABLET DAILY FOR ALLERGY AND ASTHMA 03/09/24   Theophilus Andrews, Tully GRADE, MD  Multiple Vitamin (MULTIVITAMIN PO) Take by mouth daily.    [provider]  Omega-3 Fatty Acids (OMEGA-3 FISH OIL PO) Take by mouth.    [provider]  rosuvastatin  (CRESTOR ) 40 MG tablet Take 1 tablet Daily for Cholesterol 03/09/24   Theophilus Andrews, Tully GRADE, MD  valsartan  (DIOVAN ) 320 MG tablet Take 1 tablet (320 mg total) by mouth daily. 03/09/24   Theophilus Andrews, Tully GRADE, MD                                                                                                                                    Allergies Mavik [trandolapril] and Hctz [hydrochlorothiazide]  Review of Systems Review of Systems As noted in HPI  Physical Exam Vital Signs  I have reviewed the triage vital signs BP (!) 150/81   Pulse 70   Temp 97.7 F (36.5 C) (Oral)   Resp 16   SpO2 96%   Physical Exam Vitals reviewed.  Constitutional:      General: He is not in acute distress.    Appearance: He is well-developed. He is not diaphoretic.  HENT:      Head: Normocephalic and atraumatic.     Right Ear: External ear normal.     Left Ear: External ear normal.     Nose: Nose normal.     Mouth/Throat:     Mouth: Mucous membranes are moist.  Eyes:     General: No scleral icterus.    Conjunctiva/sclera: Conjunctivae normal.  Neck:     Trachea: Phonation normal.  Cardiovascular:     Rate and Rhythm: Normal rate and regular rhythm.  Pulmonary:     Effort: Pulmonary effort is normal. No respiratory distress.     Breath sounds: No stridor.  Abdominal:     General: There is no distension.     Tenderness: There is no abdominal tenderness.  Genitourinary:    Comments: DRE with red tinged stool Musculoskeletal:        General: Normal range of motion.     Cervical back: Normal range of motion.  Neurological:     Mental Status: He is alert and oriented to person, place, and time.  Psychiatric:        Behavior: Behavior normal.     ED Results and Treatments Labs (all labs ordered are listed, but  only abnormal results are displayed) Labs Reviewed  COMPREHENSIVE METABOLIC PANEL WITH GFR - Abnormal; Notable for the following components:      Result Value   Creatinine, Ser 1.25 (*)    Alkaline Phosphatase 134 (*)    GFR, Estimated 57 (*)    All other components within normal limits  CBC  POC OCCULT BLOOD, ED                                                                                                                         EKG  EKG Interpretation Date/Time:    Ventricular Rate:    PR Interval:    QRS Duration:    QT Interval:    QTC Calculation:   R Axis:      Text Interpretation:         Radiology No results found.  Medications Ordered in ED Medications - No data to display Procedures Procedures  (including critical care time) Medical Decision Making / ED Course   Medical Decision Making Amount and/or Complexity of Data Reviewed External Data Reviewed: notes.    Details: GI clinic note Labs: ordered.  Decision-making details documented in ED Course.    Patient presents for hematochezia No active bleeding Not anticoagulated No abdominal pain concerning for diverticulitis or other intra-abdominal premature/infectious process requiring imaging at this time. CBC without leukocytosis or anemia.  Hemoglobin actually improved from 7 months ago. CMP without significant electrolyte derangements.  BUN normal and not concerning for upper GI bleed.  Mild renal sufficiency without AKI.  Recommend continued dietary change and monitoring for resolution.  GI follow-up recommended.  Strict return precautions given.    Final Clinical Impression(s) / ED Diagnoses Final diagnoses:  Hematochezia   The patient appears reasonably screened and/or stabilized for discharge and I doubt any other medical condition or other [facility] requiring further screening, evaluation, or treatment in the ED at this time. I have discussed the findings, Dx and Tx plan with the patient/family who expressed understanding and agree(s) with the plan. Discharge instructions discussed at length. The patient/family was given strict return precautions who verbalized understanding of the instructions. No further questions at time of discharge.  Disposition: Discharge  Condition: Good  ED Discharge Orders     None        Follow Up: Theophilus Andrews, Tully GRADE, MD [address] [place] [address] [phone]  Call  to schedule an appointment for close follow up  San Sandor GAILS, DO [address] [place] [address] [phone]  Call  to schedule an appointment for close follow up    This chart was dictated using voice recognition software.  Despite best efforts to proofread,  errors can occur which can change the documentation meaning.    Trine Raynell Moder, MD 06/23/24 [id number]

## 2024-06-23 NOTE — ED Triage Notes (Signed)
 Pt presents via POV c/o rectal bleeding since Friday. Reports dx with diverticulosis but has had persistent bleeding since then. Denies pain. Reports bloating. Reports BRBPR.

## 2024-06-24 ENCOUNTER — Ambulatory Visit: Payer: Self-pay | Admitting: Physician Assistant

## 2024-06-24 ENCOUNTER — Encounter: Payer: Self-pay | Admitting: Physician Assistant

## 2024-06-24 ENCOUNTER — Other Ambulatory Visit

## 2024-06-24 ENCOUNTER — Ambulatory Visit: Admitting: Physician Assistant

## 2024-06-24 VITALS — BP 142/78 | HR 75 | Ht 68.0 in | Wt 176.0 lb

## 2024-06-24 DIAGNOSIS — K5790 Diverticulosis of intestine, part unspecified, without perforation or abscess without bleeding: Secondary | ICD-10-CM

## 2024-06-24 DIAGNOSIS — K921 Melena: Secondary | ICD-10-CM

## 2024-06-24 DIAGNOSIS — K644 Residual hemorrhoidal skin tags: Secondary | ICD-10-CM

## 2024-06-24 DIAGNOSIS — K641 Second degree hemorrhoids: Secondary | ICD-10-CM

## 2024-06-24 DIAGNOSIS — Z85038 Personal history of other malignant neoplasm of large intestine: Secondary | ICD-10-CM | POA: Diagnosis not present

## 2024-06-24 DIAGNOSIS — K640 First degree hemorrhoids: Secondary | ICD-10-CM

## 2024-06-24 DIAGNOSIS — K649 Unspecified hemorrhoids: Secondary | ICD-10-CM | POA: Diagnosis not present

## 2024-06-24 DIAGNOSIS — F1090 Alcohol use, unspecified, uncomplicated: Secondary | ICD-10-CM

## 2024-06-24 LAB — COMPREHENSIVE METABOLIC PANEL WITH GFR
ALT: 32 U/L (ref 0–53)
AST: 27 U/L (ref 0–37)
Albumin: 4.1 g/dL (ref 3.5–5.2)
Alkaline Phosphatase: 101 U/L (ref 39–117)
BUN: 16 mg/dL (ref 6–23)
CO2: 29 meq/L (ref 19–32)
Calcium: 9.2 mg/dL (ref 8.4–10.5)
Chloride: 103 meq/L (ref 96–112)
Creatinine, Ser: 1.16 mg/dL (ref 0.40–1.50)
GFR: 58.39 mL/min — ABNORMAL LOW (ref >60.00)
Glucose, Bld: 95 mg/dL (ref 70–99)
Potassium: 4.2 meq/L (ref 3.5–5.1)
Sodium: 141 meq/L (ref 135–145)
Total Bilirubin: 0.4 mg/dL (ref 0.2–1.2)
Total Protein: 7.4 g/dL (ref 6.0–8.3)

## 2024-06-24 LAB — CBC WITH DIFFERENTIAL/PLATELET
Basophils Absolute: 0.1 K/uL (ref 0.0–0.1)
Basophils Relative: 1.1 % (ref 0.0–3.0)
Eosinophils Absolute: 0.6 K/uL (ref 0.0–0.7)
Eosinophils Relative: 10.3 % — ABNORMAL HIGH (ref 0.0–5.0)
HCT: 41.7 % (ref 39.0–52.0)
Hemoglobin: 13.6 g/dL (ref 13.0–17.0)
Lymphocytes Relative: 23.2 % (ref 12.0–46.0)
Lymphs Abs: 1.4 K/uL (ref 0.7–4.0)
MCHC: 32.5 g/dL (ref 30.0–36.0)
MCV: 90.5 fl (ref 78.0–100.0)
Monocytes Absolute: 0.7 K/uL (ref 0.1–1.0)
Monocytes Relative: 11.5 % (ref 3.0–12.0)
Neutro Abs: 3.3 K/uL (ref 1.4–7.7)
Neutrophils Relative %: 53.9 % (ref 43.0–77.0)
Platelets: 188 K/uL (ref 150.0–400.0)
RBC: 4.61 Mil/uL (ref 4.22–5.81)
RDW: 14.8 % (ref 11.5–15.5)
WBC: 6.1 K/uL (ref 4.0–10.5)

## 2024-06-24 MED ORDER — HYDROCORTISONE ACETATE 25 MG RE SUPP
25.0000 mg | Freq: Two times a day (BID) | RECTAL | 0 refills | Status: DC
Start: 2024-06-24 — End: 2024-09-13

## 2024-06-24 NOTE — Patient Instructions (Signed)
 Your provider has requested that you go to the basement level for lab work before leaving today. Press B on the elevator. The lab is located at the first door on the left as you exit the elevator.  Please do sitz baths- these can be found at the pharmacy. It is a Chief Operating Officer that is put in your toliet.  Please increase fiber or add benefiber, increase water and increase acitivity.  Will send in hydrocoritsone suppository, cheapest with GOODRX from [employer], [employer], [employer] or [facility] if your insurance does not pay for it. If the hemorrhoid suppository sent in is too expensive you can do this over the counter trick.  Apply a pea size amount of generic prescription Anusol  HC cream that has been sent into your pharmacy to the tip of an over the counter PrepH suppository and insert rectally once every night for at least 7 nights.  If this does not improve there are procedures that can be done.   About Hemorrhoids  Hemorrhoids are swollen veins in the lower rectum and anus.  Also called piles, hemorrhoids are a common problem.  Hemorrhoids may be internal (inside the rectum) or external (around the anus).  Internal Hemorrhoids  Internal hemorrhoids are often painless, but they rarely cause bleeding.  The internal veins may stretch and fall down (prolapse) through the anus to the outside of the body.  The veins may then become irritated and painful.  External Hemorrhoids  External hemorrhoids can be easily seen or felt around the anal opening.  They are under the skin around the anus.  When the swollen veins are scratched or broken by straining, rubbing or wiping they sometimes bleed.  How Hemorrhoids Occur  Veins in the rectum and around the anus tend to swell under pressure.  Hemorrhoids can result from increased pressure in the veins of your anus or rectum.  Some sources of pressure are:  Straining to have a bowel movement because of constipation Waiting too long to have a bowel  movement Coughing and sneezing often Sitting for extended periods of time, including on the toilet Diarrhea Obesity Trauma or injury to the anus Some liver diseases Stress Family history of hemorrhoids Pregnancy  Pregnant women should try to avoid becoming constipated, because they are more likely to have hemorrhoids during pregnancy.  In the last trimester of pregnancy, the enlarged uterus may press on blood vessels and causes hemorrhoids.  In addition, the strain of childbirth sometimes causes hemorrhoids after the birth.  Symptoms of Hemorrhoids  Some symptoms of hemorrhoids include: Swelling and/or a tender lump around the anus Itching, mild burning and bleeding around the anus Painful bowel movements with or without constipation Bright red blood covering the stool, on toilet paper or in the toilet bowel.   Symptoms usually go away within a few days.  Always talk to your doctor about any bleeding to make sure it is not from some other causes.  Diagnosing and Treating Hemorrhoids  Diagnosis is made by an examination by your healthcare provider.  Special test can be performed by your doctor.    Most cases of hemorrhoids can be treated with: High-fiber diet: Eat more high-fiber foods, which help prevent constipation.  Ask for more detailed fiber information on types and sources of fiber from your healthcare provider. Fluids: Drink plenty of water.  This helps soften bowel movements so they are easier to pass. Sitz baths and cold packs: Sitting in lukewarm water two or three times a day for 15  minutes cleases the anal area and may relieve discomfort.  If the water is too hot, swelling around the anus will get worse.  Placing a cloth-covered ice pack on the anus for ten minutes four times a day can also help reduce selling.  Gently pushing a prolapsed hemorrhoid back inside after the bath or ice pack can be helpful. Medications: For mild discomfort, your healthcare provider may suggest  over-the-counter pain medication or prescribe a cream or ointment for topical use.  The cream may contain witch hazel, zinc oxide or petroleum jelly.  Medicated suppositories are also a treatment option.  Always consult your doctor before applying medications or creams. Procedures and surgeries: There are also a number of procedures and surgeries to shrink or remove hemorrhoids in more serious cases.  Talk to your physician about these options.  You can often prevent hemorrhoids or keep them from becoming worse by maintaining a healthy lifestyle.  Eat a fiber-rich diet of fruits, vegetables and whole grains.  Also, drink plenty of water and exercise regularly.   2007, [employer] Doc.30

## 2024-06-24 NOTE — Telephone Encounter (Signed)
 Patient is scheduled for an appt today.

## 2024-06-24 NOTE — Progress Notes (Signed)
 06/24/2024 Jon [name] [id number] July 04, 1941  Referring provider: Theophilus Andrews, Estel* Primary GI doctor: Dr. San  ASSESSMENT AND PLAN:  History of hemorrhoids/hematochezia 06/23/2024  HGB 13.7 MCV 90.5 Platelets 194 Recent Labs    09/01/23 1003 11/02/23 1630 11/02/23 2114 11/05/23 1459 06/23/24 0348  HGB 13.8 13.6 13.5 11.3* 13.7  3 days of rectal bleeding, BRB and dark stool, did liquid diet, HGB was stable, had had 2 Bm's and no further blood Also in toliet and in stool, no rectal itching/burning Status post banding 11/17/2023 RP, 01/01/2024 LL ER visit 06/23/2024 for hematochezia had stable hemoglobin no BUN elevation Likely hemorrhoidal, on anoscopy RP inflamed.  - recheck CBC -Sitz baths, increase fiber, increase water -Hydrocortisone  supp given and external cream sent in.  -We discussed hemorrhoid banding here in the office for internal hemorrhoids if not improving with conservative treatment. Follow up with Dr. JAYSON for evaluation/banding - follow up for evaluation here in the office.   History of diverticulosis 11/02/2023 CTAP W small HH, cholelithiasis without cholecystitis, colon diverticulosis without diverticulitis Recheck CBC  History of colon cancer  s/p resection, chemotherapy, and radiation 2004.   Last colonoscopy in 2023 with a single subcentimeter adenoma, otherwise healthy appearing anastomosis and no evidence of recurrence.  Previously discussed role/utility of repeat colonoscopy after 5 years for ongoing surveillance.  He would be age 88 at that point, so this would certainly be done with strong consideration of overall health and patient wishes rather than scheduling directly for procedure.   Patient Care Team: Theophilus Andrews, Tully GRADE, MD as PCP - General (Internal Medicine) Camillo Golas, MD as Consulting Physician (Ophthalmology) Luis Purchase, MD as Consulting Physician (Gastroenterology) Cloretta Arley NOVAK, MD as Consulting  Physician (Oncology)  HISTORY OF PRESENT ILLNESS: 83 y.o. male with a past medical history listed below presents for evaluation of rectal bleeding.   Patient last seen 11/17/2023 by Dr. San for grade 2 hemorrhoids  Discussed the use of AI scribe software for clinical note transcription with the patient, who gave verbal consent to proceed.  History of Present Illness   Jon Yang Jr. is an 83 year old male with a history of hemorrhoids who presents with rectal bleeding.  He experienced bright red rectal bleeding that began last Friday and persisted for three days, initially worsening. The bleeding was visible in the toilet and on wiping, prompting an emergency room visit where blood tests were conducted. The bleeding stopped yesterday, and subsequent bowel movements have not shown any blood.  He has a history of hemorrhoids and underwent hemorrhoidal banding in February 2023. He recalls a similar episode of bleeding last year, which also led to an emergency room visit and subsequent referral for hemorrhoid treatment. He has not used hemorrhoid suppositories recently but has them available.  He has not experienced any rectal burning, itching, or discomfort associated with the bleeding. His bowel habits have been irregular over the past six months, deviating from the routine advised by his previous doctor, which included increased fiber intake and hydration. He attributes the recent bleeding episode to this deviation from his routine.  He uses hemococcal cards at home to test for blood in his stool, which have not shown any positive results recently.  No shortness of breath or significant physical limitations, as he was able to mow his yard without difficulty.        He  reports that he has never smoked. He has never used smokeless tobacco. He reports current alcohol use of  about 7.0 standard drinks of alcohol per week. He reports that he does not use drugs.  RELEVANT GI HISTORY,  IMAGING AND LABS: Results   LABS Hemoglobin: elevated (06/23/2024)  DIAGNOSTIC Rectal examination: Hemorrhoidal skin tag, no fissures, brown stool, no black stool, right posterior hemorrhoid present (06/24/2024)  Procedure: Anoscopy Description: Anoscope inserted to visualize hemorrhoids. Noted stool presence and irritation at right posterior hemorrhoid. Hemorrhoid not large but present.     Previously followed with Dr. Luis.  History of colon cancer s/p resection, chemotherapy, and radiation 2004.  Limited prior available records as below:   -04/04/2003: Partial colectomy and appendectomy with path demonstrating moderately differentiated rectal adenocarcinoma with deep invasion of muscularis propria and metastasis of 1/8 perirectal lymph nodes - 03/17/2011: Colonoscopy (Dr. Luis): No report available for review, but pathology report reviewed (mucosal prolapse polyp) and recommended repeat in 5 years  - 03/17/2016: Colonoscopy (Dr. Luis): Pandiverticulosis, internal hemorrhoids.  Repeat in 5 years   - 06/12/2022: Initial appointment in the [facility] to transfer care.  No GI symptoms. - 07/04/2022: Colonoscopy: 4 mm cecal polyp (path benign), 5 mm descending colon adenoma, pandiverticulosis, healthy-appearing anastomosis in the rectosigmoid, small internal hemorrhoids.  Normal TI. CBC    Component Value Date/Time   WBC 7.7 06/23/2024 0348   RBC 4.61 06/23/2024 0348   HGB 13.7 06/23/2024 0348   HCT 41.7 06/23/2024 0348   PLT 194 06/23/2024 0348   MCV 90.5 06/23/2024 0348   MCH 29.7 06/23/2024 0348   MCHC 32.9 06/23/2024 0348   RDW 14.2 06/23/2024 0348   LYMPHSABS 1.7 11/02/2023 1630   MONOABS 0.7 11/02/2023 1630   EOSABS 566 (H) 11/05/2023 1459   BASOSABS 118 11/05/2023 1459   Recent Labs    09/01/23 1003 11/02/23 1630 11/02/23 2114 11/05/23 1459 06/23/24 0348  HGB 13.8 13.6 13.5 11.3* 13.7    CMP     Component Value Date/Time   NA 142 06/23/2024 0348   K 4.2  06/23/2024 0348   CL 104 06/23/2024 0348   CO2 27 06/23/2024 0348   GLUCOSE 86 06/23/2024 0348   BUN 20 06/23/2024 0348   CREATININE 1.25 (H) 06/23/2024 0348   CREATININE 1.11 09/01/2023 1003   CALCIUM  9.3 06/23/2024 0348   PROT 7.2 06/23/2024 0348   ALBUMIN 4.1 06/23/2024 0348   AST 36 06/23/2024 0348   ALT 36 06/23/2024 0348   ALKPHOS 134 (H) 06/23/2024 0348   BILITOT 0.3 06/23/2024 0348   GFRNONAA 57 (L) 06/23/2024 0348   GFRNONAA 72 02/14/2021 1001   GFRAA 84 02/14/2021 1001      Latest Ref Rng & Units 06/23/2024    3:48 AM 11/02/2023    4:30 PM 09/01/2023   10:03 AM  Hepatic Function  Total Protein 6.5 - 8.1 g/dL 7.2  6.8  7.2   Albumin 3.5 - 5.0 g/dL 4.1  3.6    AST 15 - 41 U/L 36  28  26   ALT 0 - 44 U/L 36  24  26   Alk Phosphatase 38 - 126 U/L 134  91    Total Bilirubin 0.0 - 1.2 mg/dL 0.3  0.5  0.4       Current Medications:    Current Outpatient Medications (Cardiovascular):    diltiazem  (TIAZAC ) 240 MG 24 hr capsule, TAKE 1 CAPSULE EVERY MORNING FOR BLOOD PRESSURE   rosuvastatin  (CRESTOR ) 40 MG tablet, Take 1 tablet Daily for Cholesterol   valsartan  (DIOVAN ) 320 MG tablet, Take 1 tablet (320  mg total) by mouth daily.  Current Outpatient Medications (Respiratory):    cetirizine  (ZYRTEC ) 10 MG tablet, TAKE 1 TABLET DAILY FOR ALLERGIES                                                             /                                                                   TAKE                                         BY                                                 MOUTH   fluticasone -salmeterol (WIXELA INHUB) 250-50 MCG/ACT AEPB, Inhale 1 puff into the lungs in the morning and at bedtime.   Ipratropium-Albuterol  (COMBIVENT  RESPIMAT) 20-100 MCG/ACT AERS respimat, Inhale 1 puff into the lungs every 4 (four) hours as needed for wheezing.   montelukast  (SINGULAIR ) 10 MG tablet, TAKE 1 TABLET DAILY FOR ALLERGY AND ASTHMA  Current Outpatient Medications (Analgesics):     aspirin EC 81 MG tablet, Take 81 mg by mouth daily. Swallow whole.  Current Outpatient Medications (Hematological):    Folic Acid-Cholecalciferol (CHOLECAL DF PO), Take 2,000 Units by mouth daily.  Current Outpatient Medications (Other):    hydrocortisone  (ANUSOL -HC) 25 MG suppository, Place 1 suppository (25 mg total) rectally 2 (two) times daily.   Multiple Vitamin (MULTIVITAMIN PO), Take by mouth daily.   Omega-3 Fatty Acids (OMEGA-3 FISH OIL PO), Take by mouth.  Medical History:  Past Medical History:  Diagnosis Date   Allergy    Asthma    BPH (benign prostatic hyperplasia)    GERD (gastroesophageal reflux disease)    History of colon cancer    Hyperlipidemia    Hypertension    Prediabetes    Vitamin D  deficiency    Allergies:  Allergies  Allergen Reactions   Mavik [Trandolapril]     Cough   Hctz [Hydrochlorothiazide] Rash    Photosensitivity     Surgical History:  He  has a past surgical history that includes Colon surgery (2004). Family History:  His family history includes Autoimmune disease in his brother; Breast cancer in his sister; Cancer in his father and sister; Colon cancer in his cousin; Crohn's disease in his daughter; Heart disease in his mother.  REVIEW OF SYSTEMS  : All other systems reviewed and negative except where noted in the History of Present Illness.  PHYSICAL EXAM: BP (!) 142/78   Pulse 75   Ht 5' 8 (1.727 m)   Wt 176 lb (79.8 kg)   BMI 26.76 kg/m  Physical Exam   GENERAL APPEARANCE: Well nourished, in no apparent distress. HEENT: No cervical lymphadenopathy, unremarkable thyroid , sclerae anicteric, conjunctiva pink.  RESPIRATORY: Respiratory effort normal, breath sounds equal bilaterally with wheezing, without rales or rhonchi. CARDIO: Regular rate and rhythm with no murmurs, rubs, or gallops, peripheral pulses intact. ABDOMEN: Soft, non-distended, active bowel sounds in all four quadrants, no tenderness to palpation, no rebound, no mass  appreciated. RECTAL: External exam: Normal external rectal exam, skin tags, external hemorrhoids non thrombosed, Rectal tone: normal rectal tone Internal exam: non-tender, no masses, brown stool soft, hemoccult Negative Anoscopy was performed after rectal exam with the patient in the left lateral decubitus position and revealed  Right posterior hemorrhoids No nodule seen MUSCULOSKELETAL: Full range of motion, normal gait, without edema. SKIN: Dry, intact without rashes or lesions. No jaundice. NEURO: Alert, oriented, no focal deficits. PSYCH: Cooperative, normal mood and affect.     Alan JONELLE Coombs, PA-C 2:56 PM

## 2024-06-27 NOTE — Progress Notes (Signed)
 Agree with the assessment and plan as outlined by Quentin Mulling, PA-C. ? ?Keron Neenan, DO, FACG ? ?

## 2024-07-21 ENCOUNTER — Ambulatory Visit (INDEPENDENT_AMBULATORY_CARE_PROVIDER_SITE_OTHER): Admitting: Family Medicine

## 2024-07-21 DIAGNOSIS — Z0000 Encounter for general adult medical examination without abnormal findings: Secondary | ICD-10-CM

## 2024-07-21 NOTE — Patient Instructions (Addendum)
 I really enjoyed getting to talk with you today! I am available on Tuesdays and Thursdays for virtual visits if you have any questions or concerns, or if I can be of any further assistance.   CHECKLIST FROM ANNUAL WELLNESS VISIT:  -Follow up (please call to schedule if not scheduled after visit):   -yearly for annual wellness visit with primary care office  Here is a list of your preventive care/health maintenance measures and the plan for each if any are due:  PLAN For any measures below that may be due:    1. Can get flu shot at the office or the pharmacy.   2. Can get other vaccines at the pharmacy, please let us  know if you do so that we can update your records.   Health Maintenance  Topic Date Due   Zoster Vaccines- Shingrix (1 of 2) Never done   Medicare Annual Wellness (AWV)  05/27/2024   Influenza Vaccine  06/03/2024   COVID-19 Vaccine (6 - Pfizer risk 2024-25 season) 07/04/2024   Colonoscopy  07/05/2027   DTaP/Tdap/Td (3 - Td or Tdap) 06/03/2030   Pneumococcal Vaccine: 50+ Years  Completed   HPV VACCINES  Aged Out   Meningococcal B Vaccine  Aged Out    -See a dentist at least yearly  -Get your eyes checked and then per your eye specialist's recommendations  -Other issues addressed today:   -I have included below further information regarding a healthy whole foods based diet, physical activity guidelines for adults, stress management and opportunities for social connections. I hope you find this information useful.   -----------------------------------------------------------------------------------------------------------------------------------------------------------------------------------------------------------------------------------------------------------    NUTRITION: -eat real food: lots of colorful vegetables (half the plate) and fruits -5-7 servings of vegetables and fruits per day (fresh or steamed is best), exp. 2 servings of vegetables with lunch  and dinner and 2 servings of fruit per day. Berries and greens such as kale and collards are great choices.  -consume on a regular basis:  fresh fruits, fresh veggies, fish, nuts, seeds, healthy oils (such as olive oil, avocado oil), whole grains (make sure for bread/pasta/crackers/etc., that the first ingredient on label contains the word whole), legumes. -can eat small amounts of dairy and lean meat (no larger than the palm of your hand), but avoid processed meats such as ham, bacon, lunch meat, etc. -drink water -try to avoid fast food and pre-packaged foods, processed meat, ultra processed foods/beverages (donuts, candy, etc.) -most experts advise limiting sodium to < 2300mg  per day, should limit further is any chronic conditions such as high blood pressure, heart disease, diabetes, etc. The American Heart Association advised that < 1500mg  is is ideal -try to avoid foods/beverages that contain any ingredients with names you do not recognize  -try to avoid foods/beverages  with added sugar or sweeteners/sweets  -try to avoid sweet drinks (including diet drinks): soda, juice, Gatorade, sweet tea, power drinks, diet drinks -try to avoid white rice, white bread, pasta (unless whole grain)  EXERCISE GUIDELINES FOR ADULTS: -if you wish to increase your physical activity, do so gradually and with the approval of your doctor -STOP and seek medical care immediately if you have any chest pain, chest discomfort or trouble breathing when starting or increasing exercise  -move and stretch your body, legs, feet and arms when sitting for long periods -Physical activity guidelines for optimal health in adults: -get at least 150 minutes per week of moderate exercise (can talk, but not sing); this is about 20-30 minutes of sustained activity  5-7 days per week or two 10-15 minute episodes of sustained activity 5-7 days per week -do some muscle building/resistance training/strength training at least 2 days per  week  -balance exercises 3+ days per week:   Stand somewhere where you have something sturdy to hold onto if you lose balance    1) lift up on toes, then back down, start with 5x per day and work up to 20x   2) stand and lift one leg straight out to the side so that foot is a few inches of the floor, start with 5x each side and work up to 20x each side   3) stand on one foot, start with 5 seconds each side and work up to 20 seconds on each side  If you need ideas or help with getting more active:  -Silver sneakers https://tools.silversneakers.com  -Walk with a Doc: http://www.duncan-williams.com/  -try to include resistance (weight lifting/strength building) and balance exercises twice per week: or the following link for ideas: http://castillo-powell.com/  BuyDucts.dk  STRESS MANAGEMENT: -can try meditating, or just sitting quietly with deep breathing while intentionally relaxing all parts of your body for 5 minutes daily -if you need further help with stress, anxiety or depression please follow up with your primary doctor or contact the wonderful folks at [facility]: [phone]  SOCIAL CONNECTIONS: -options in [place] if you wish to engage in more social and exercise related activities:  -Silver sneakers https://tools.silversneakers.com  -Walk with a Doc: http://www.duncan-williams.com/  -Check out the [facility] Active Adults 50+ section on the [place] [employer] (hiking clubs, book clubs, cards and games, chess, exercise classes, aquatic classes and much more) - see the website for details: https://www.[place].gov/departments/parks-recreation/active-adults50  -YouTube has lots of exercise videos for different ages and abilities as well  -[facility] (a variety of indoor and outdoor inperson activities for adults). 7190165869. [address]  -Virtual Online Classes (a variety of topics): see seniorplanet.org or call [phone]  -consider volunteering at a school, hospice center, church, senior center or elsewhere  FOR IMPROVED SLEEP AND TO RESET YOUR SLEEP SCHEDULE:  []  Schedule sleep counseling(cognitive behavioral therapy).   [facility] is a good option.   Call for appointment: [phone]  []  Exercise 30 minutes daily. Some people do better with exercise in the morning, other do better exercising later in the day.  []  Avoid caffeine and alcohol - particularly in the evenings. For some people even a little alcohol can impair sleep.   []  Go to bed and wake up at the same time everyday (within a 30 minute window). When you get up in the morning for your designated wake time - turn on lights and open curtains right away. This will help to set your internal sleep clock.   []  Keep bedroom cool, dark and quiet - if you have to get up at night make sure there is no white light from street lamps, night lights, lights, clock, devices etc. that enters your eyes. Instead, use red light flashlight or night lights and avoid looking directly at the light while up.   []  Set 1-2 hour bedtime routine, dim lights, avoid screens (phones, computers, TVs, etc during this time), consider sleepytime tea, warm bath or shower, avoid alcohol and caffeine  []  Reserve bed for sleep - do not read, watch TV, look at phone or device, etc., in bed.  []  If you toss and turn for more then 10-15 minutes, get out of bed and list or  journal thoughts or do quiet activity (not screen-time, no tv, phone, computer) then go back to bed. Repeat as needed. Try not to worry about when you will eventually fall asleep.  []  Some people find that a half dose of benadryl, melatonin, tylenol pm or unisom on a few nights per week is helpful initially for a few weeks.  []  Seek help for any depression or anxiety.  [] Prescription strength sleep  medications should only be used in severe cases of insomnia if other measures fail and should be used sparingly.  I hope you are feeling better soon! Follow up with your doctor in 3-4 weeks or sooner if your symptoms worsen or new concerns arise.

## 2024-07-21 NOTE — Progress Notes (Signed)
 PATIENT CHECK-IN and HEALTH RISK ASSESSMENT QUESTIONNAIRE:  -completed by phone/video for upcoming Medicare Preventive Visit   Pre-Visit Check-in: 1)Vitals (height, wt, BP, etc) - record in vitals section for visit on day of visit Request home vitals (wt, BP, etc.) and enter into vitals, THEN update Vital Signs SmartPhrase below at the top of the HPI. See below.  2)Review and Update Medications, Allergies PMH, Surgeries, Social history in Epic 3)Hospitalizations in the last year with date/reason? n  4)Review and Update Care Team (patient's specialists) in Epic 5) Complete PHQ9 in Epic  6) Complete Fall Screening in Epic 7)Review all Health Maintenance Due and order if not done.  Medicare Wellness Patient Questionnaire:  Answer theses question about your habits: How often do you have a drink containing alcohol?monthly, or less How many drinks containing alcohol do you have on a typical day when you are drinking?1-2 How often do you have six or more drinks on one occasion?never Have you ever smoked?n Quit date if applicable? na  How many packs a day do/did you smoke? na Do you use smokeless tobacco?n Do you use an illicit drugs?n On average, how many days per week do you engage in moderate to strenuous exercise (like a brisk walk)? 3 days On average, how many minutes do you engage in exercise at this level? 30 minutes, walking, sometimes hand weights Typical breakfast: oatmeal, whole grain high fiber bread, fruit, avocado, beans Typical lunch/dinner: beans, cabbage, broccoli, veggies, reports is mostly vegetarian Typical snacks:nuts  Beverages: water Socialization: has lot of socialization Sleep: reports sleeps well for the most part, sometimes disturbed by dreams, etc and then has trouble going back to sleep.   Answer theses question about your everyday activities: Can you perform most household chores?y Are you deaf or have significant trouble hearing?n Do you feel that you have  a problem with memory?mild at times, but then things come to him 0 names of actors, etc.  Do you feel safe at home?y Last dentist visit?goes twice a year 8. Do you have any difficulty performing your everyday activities?n Are you having any difficulty walking, taking medications on your own, and or difficulty managing daily home needs?n Do you have difficulty walking or climbing stairs?n Do you have difficulty dressing or bathing?n Do you have difficulty doing errands alone such as visiting a doctor's office or shopping?n Do you currently have any difficulty preparing food and eating?n Do you currently have any difficulty using the toilet?n Do you have any difficulty managing your finances?n Do you have any difficulties with housekeeping of managing your housekeeping?n   Do you have Advanced Directives in place (Living Will, Healthcare Power or Attorney)? yes   Last eye Exam and location? Once a year, Dr. Camillo   Do you currently use prescribed or non-prescribed narcotic or opioid pain medications? no  Do you have a history or close family history of breast, ovarian, tubal or peritoneal cancer or a family member with BRCA (breast cancer susceptibility 1 and 2) gene mutations?    ----------------------------------------------------------------------------------------------------------------------------------------------------------------------------------------------------------------------  Because this visit was a virtual/telehealth visit, some criteria may be missing or patient reported. Any vitals not documented were not able to be obtained and vitals that have been documented are patient reported.    MEDICARE ANNUAL PREVENTIVE CARE VISIT WITH PROVIDER (Welcome to Medicare, initial annual wellness or annual wellness exam)  Virtual Visit via Video Note  I connected with Jon Velardi Jr. on 07/21/24  by  a video enabled telemedicine application and verified that  I am speaking  with the correct person using two identifiers.  Location patient: home Location provider:work or home office Persons participating in the virtual visit: patient, provider  Concerns and/or follow up today: doing well.    See HM section in Epic for other details of completed HM.    ROS: negative for report of fevers, unintentional weight loss, vision changes, vision loss, hearing loss or change, chest pain, sob, falls, thoughts of suicide or self harm, memory loss  Patient-completed extensive health risk assessment - reviewed and discussed with the patient: See Health Risk Assessment completed with patient prior to the visit either above or in recent phone note. This was reviewed in detailed with the patient today and appropriate recommendations, orders and referrals were placed as needed per Summary below and patient instructions.   Review of Medical History: -PMH, PSH, Family History and current specialty and care providers reviewed and updated and listed below   Patient Care Team: Theophilus Andrews, Tully GRADE, MD as PCP - General (Internal Medicine) Camillo Golas, MD as Consulting Physician (Ophthalmology) Luis Purchase, MD as Consulting Physician (Gastroenterology) Cloretta Arley NOVAK, MD as Consulting Physician (Oncology)   Past Medical History:  Diagnosis Date   Allergy    Asthma    BPH (benign prostatic hyperplasia)    GERD (gastroesophageal reflux disease)    History of colon cancer    Hyperlipidemia    Hypertension    Prediabetes    Vitamin D  deficiency     Past Surgical History:  Procedure Laterality Date   COLON SURGERY  2004   Dr Nicholaus    Social History   Socioeconomic History   Marital status: Married    Spouse name: Not on file   Number of children: Not on file   Years of education: Not on file   Highest education level: 12th grade  Occupational History   Not on file  Tobacco Use   Smoking status: Never   Smokeless tobacco: Never  Vaping Use    Vaping status: Never Used  Substance and Sexual Activity   Alcohol use: Yes    Alcohol/week: 7.0 standard drinks of alcohol    Types: 7 Glasses of wine per week   Drug use: No   Sexual activity: Not on file  Other Topics Concern   Not on file  Social History Narrative   Not on file   Social Drivers of Health   Financial Resource Strain: Low Risk  (07/21/2024)   Overall Financial Resource Strain (CARDIA)    Difficulty of Paying Living Expenses: Not hard at all  Food Insecurity: No Food Insecurity (07/21/2024)   Hunger Vital Sign    Worried About Running Out of Food in the Last Year: Never true    Ran Out of Food in the Last Year: Never true  Transportation Needs: No Transportation Needs (07/21/2024)   PRAPARE - Administrator, Civil Service (Medical): No    Lack of Transportation (Non-Medical): No  Physical Activity: Insufficiently Active (07/21/2024)   Exercise Vital Sign    Days of Exercise per Week: 3 days    Minutes of Exercise per Session: 30 min  Stress: No Stress Concern Present (07/21/2024)   [employer] of Occupational Health - Occupational Stress Questionnaire    Feeling of Stress: Only a little  Social Connections: Unknown (07/21/2024)   Social Connection and Isolation Panel    Frequency of Communication with Friends and Family: Once a week    Frequency of Social Gatherings  with Friends and Family: Patient declined    Attends Religious Services: More than 4 times per year    Active Member of Clubs or Organizations: Yes    Attends Engineer, structural: More than 4 times per year    Marital Status: Married  Catering manager Violence: Not At Risk (03/09/2024)   Humiliation, Afraid, Rape, and Kick questionnaire    Fear of Current or Ex-Partner: No    Emotionally Abused: No    Physically Abused: No    Sexually Abused: No    Family History  Problem Relation Age of Onset   Heart disease Mother    Cancer Father    Cancer Sister    Breast cancer  Sister    Autoimmune disease Brother    Crohn's disease Daughter    Colon cancer Cousin        Second cousin on dad side   Esophageal cancer Neg Hx    Rectal cancer Neg Hx    Stomach cancer Neg Hx     Current Outpatient Medications on File Prior to Visit  Medication Sig Dispense Refill   aspirin EC 81 MG tablet Take 81 mg by mouth daily. Swallow whole.     cetirizine  (ZYRTEC ) 10 MG tablet TAKE 1 TABLET DAILY FOR ALLERGIES                                                             /                                                                   TAKE                                         BY                                                 MOUTH 90 tablet 3   diltiazem  (TIAZAC ) 240 MG 24 hr capsule TAKE 1 CAPSULE EVERY MORNING FOR BLOOD PRESSURE 90 capsule 3   fluticasone -salmeterol (WIXELA INHUB) 250-50 MCG/ACT AEPB Inhale 1 puff into the lungs in the morning and at bedtime. 180 each 0   Folic Acid-Cholecalciferol (CHOLECAL DF PO) Take 2,000 Units by mouth daily.     hydrocortisone  (ANUSOL -HC) 25 MG suppository Place 1 suppository (25 mg total) rectally 2 (two) times daily. 12 suppository 0   Ipratropium-Albuterol  (COMBIVENT  RESPIMAT) 20-100 MCG/ACT AERS respimat Inhale 1 puff into the lungs every 4 (four) hours as needed for wheezing. 4 g 4   montelukast  (SINGULAIR ) 10 MG tablet TAKE 1 TABLET DAILY FOR ALLERGY AND ASTHMA 90 tablet 3   Multiple Vitamin (MULTIVITAMIN PO) Take by mouth daily.     Omega-3 Fatty Acids (OMEGA-3 FISH OIL PO) Take by mouth.     rosuvastatin  (CRESTOR ) 40 MG tablet Take 1  tablet Daily for Cholesterol 90 tablet 3   valsartan  (DIOVAN ) 320 MG tablet Take 1 tablet (320 mg total) by mouth daily. 90 tablet 3   No current facility-administered medications on file prior to visit.    Allergies  Allergen Reactions   Mavik [Trandolapril]     Cough   Hctz [Hydrochlorothiazide] Rash    Photosensitivity       Physical Exam Vitals requested from patient and listed  below if patient had equipment and was able to obtain at home for this virtual visit: There were no vitals filed for this visit. Estimated body mass index is 26.76 kg/m as calculated from the following:   Height as of 06/24/24: 5' 8 (1.727 m).   Weight as of 06/24/24: 176 lb (79.8 kg).  EKG (optional): deferred due to virtual visit  GENERAL: alert, oriented, no acute distress detected; full vision exam deferred due to pandemic and/or virtual encounter   HEENT: atraumatic, conjunttiva clear, no obvious abnormalities on inspection of external nose and ears  NECK: normal movements of the head and neck  LUNGS: on inspection no signs of respiratory distress, breathing rate appears normal, no obvious gross SOB, gasping or wheezing  CV: no obvious cyanosis  MS: moves all visible extremities without noticeable abnormality  PSYCH/NEURO: pleasant and cooperative, no obvious depression or anxiety, speech and thought processing grossly intact, Cognitive function grossly intact  Flowsheet Row Office Visit from 03/09/2024 in [facility] [place]  PHQ-9 Total Score 14        07/21/2024   10:54 AM 03/09/2024    1:15 PM 05/28/2023    3:07 PM 12/08/2022    9:06 PM 08/25/2022   10:09 AM  Depression screen PHQ 2/9  Decreased Interest 0 0 0 0 0  Down, Depressed, Hopeless 0 0 0 0 0  PHQ - 2 Score 0 0 0 0 0  Altered sleeping  3     Tired, decreased energy  2     Change in appetite  0     Feeling bad or failure about yourself   2     Trouble concentrating  3     Moving slowly or fidgety/restless  2     Suicidal thoughts  2     PHQ-9 Score  14     Difficult doing work/chores  Somewhat difficult          12/08/2022    9:06 PM 05/28/2023    3:07 PM 03/09/2024    1:14 PM 07/21/2024    9:35 AM 07/21/2024   10:54 AM  Fall Risk  Falls in the past year? 0 0 0 0 0  Was there an injury with Fall?   0 0 0  Fall Risk Category Calculator   0 0  0  Patient at Risk for Falls Due to No  Fall Risks  No Fall Risks    Fall risk Follow up Falls evaluation completed;Education provided;Falls prevention discussed  Falls evaluation completed  Falls evaluation completed     Patient-reported     SUMMARY AND PLAN:  Encounter for Medicare annual wellness exam  Discussed applicable health maintenance/preventive health measures and advised and referred or ordered per patient preferences: -discussed vaccines due recs/risks, advised where he can get them. Advise to let us  know if gets at the pharmacy. Health Maintenance  Topic Date Due   Zoster Vaccines- Shingrix (1 of 2) Never done   Influenza Vaccine  06/03/2024   COVID-19 Vaccine (6 - [employer]  risk 2024-25 season) 07/04/2024   Medicare Annual Wellness (AWV)  07/21/2025   Colonoscopy  07/05/2027   DTaP/Tdap/Td (3 - Td or Tdap) 06/03/2030   Pneumococcal Vaccine: 50+ Years  Completed   HPV VACCINES  Aged Out   Meningococcal B Vaccine  Aged [employer] and counseling on the following was provided based on the above review of health and a plan/checklist for the patient, along with additional information discussed, was provided for the patient in the patient instructions :  rove balance and discussed exercise guidelines for adults with include balance exercises at least 3 days per week.  -Advised and counseled on a healthy lifestyle Discussed sleep and provided counseling, further info in pt instructions -Reviewed patient's current diet. Advised and counseled on a whole foods based healthy diet. He prefers to eat mostly vegetarian so discussed getting adequate protein, amino acids and suggested a b complex. A summary of a healthy diet was provided in the Patient Instructions.  -reviewed patient's current physical activity level and discussed exercise guidelines for adults. Discussed resources and ideas for safe exercise at home to assist in meeting exercise guideline recommendations in a safe and healthy way. Further resources  provided in pt instructions.  -Advise yearly dental visits at minimum and regular eye exams   Follow up: see patient instructions   Patient Instructions  I really enjoyed getting to talk with you today! I am available on Tuesdays and Thursdays for virtual visits if you have any questions or concerns, or if I can be of any further assistance.   CHECKLIST FROM ANNUAL WELLNESS VISIT:  -Follow up (please call to schedule if not scheduled after visit):   -yearly for annual wellness visit with primary care office  Here is a list of your preventive care/health maintenance measures and the plan for each if any are due:  PLAN For any measures below that may be due:    1. Can get flu shot at the office or the pharmacy.   2. Can get other vaccines at the pharmacy, please let us  know if you do so that we can update your records.   Health Maintenance  Topic Date Due   Zoster Vaccines- Shingrix (1 of 2) Never done   Medicare Annual Wellness (AWV)  05/27/2024   Influenza Vaccine  06/03/2024   COVID-19 Vaccine (6 - [employer] risk 2024-25 season) 07/04/2024   Colonoscopy  07/05/2027   DTaP/Tdap/Td (3 - Td or Tdap) 06/03/2030   Pneumococcal Vaccine: 50+ Years  Completed   HPV VACCINES  Aged Out   Meningococcal B Vaccine  Aged Out    -See a dentist at least yearly  -Get your eyes checked and then per your eye specialist's recommendations  -Other issues addressed today:   -I have included below further information regarding a healthy whole foods based diet, physical activity guidelines for adults, stress management and opportunities for social connections. I hope you find this information useful.   -----------------------------------------------------------------------------------------------------------------------------------------------------------------------------------------------------------------------------------------------------------    NUTRITION: -eat real food: lots of  colorful vegetables (half the plate) and fruits -5-7 servings of vegetables and fruits per day (fresh or steamed is best), exp. 2 servings of vegetables with lunch and dinner and 2 servings of fruit per day. Berries and greens such as kale and collards are great choices.  -consume on a regular basis:  fresh fruits, fresh veggies, fish, nuts, seeds, healthy oils (such as olive oil, avocado oil), whole grains (make sure for bread/pasta/crackers/etc., that the first ingredient  on label contains the word whole), legumes. -can eat small amounts of dairy and lean meat (no larger than the palm of your hand), but avoid processed meats such as ham, bacon, lunch meat, etc. -drink water -try to avoid fast food and pre-packaged foods, processed meat, ultra processed foods/beverages (donuts, candy, etc.) -most experts advise limiting sodium to < 2300mg  per day, should limit further is any chronic conditions such as high blood pressure, heart disease, diabetes, etc. The American Heart Association advised that < 1500mg  is is ideal -try to avoid foods/beverages that contain any ingredients with names you do not recognize  -try to avoid foods/beverages  with added sugar or sweeteners/sweets  -try to avoid sweet drinks (including diet drinks): soda, juice, Gatorade, sweet tea, power drinks, diet drinks -try to avoid white rice, white bread, pasta (unless whole grain)  EXERCISE GUIDELINES FOR ADULTS: -if you wish to increase your physical activity, do so gradually and with the approval of your doctor -STOP and seek medical care immediately if you have any chest pain, chest discomfort or trouble breathing when starting or increasing exercise  -move and stretch your body, legs, feet and arms when sitting for long periods -Physical activity guidelines for optimal health in adults: -get at least 150 minutes per week of moderate exercise (can talk, but not sing); this is about 20-30 minutes of sustained activity 5-7 days  per week or two 10-15 minute episodes of sustained activity 5-7 days per week -do some muscle building/resistance training/strength training at least 2 days per week  -balance exercises 3+ days per week:   Stand somewhere where you have something sturdy to hold onto if you lose balance    1) lift up on toes, then back down, start with 5x per day and work up to 20x   2) stand and lift one leg straight out to the side so that foot is a few inches of the floor, start with 5x each side and work up to 20x each side   3) stand on one foot, start with 5 seconds each side and work up to 20 seconds on each side  If you need ideas or help with getting more active:  -Silver sneakers https://tools.silversneakers.com  -Walk with a Doc: http://www.duncan-williams.com/  -try to include resistance (weight lifting/strength building) and balance exercises twice per week: or the following link for ideas: http://castillo-powell.com/  BuyDucts.dk  STRESS MANAGEMENT: -can try meditating, or just sitting quietly with deep breathing while intentionally relaxing all parts of your body for 5 minutes daily -if you need further help with stress, anxiety or depression please follow up with your primary doctor or contact the wonderful folks at [facility]: [phone]  SOCIAL CONNECTIONS: -options in [place] if you wish to engage in more social and exercise related activities:  -Silver sneakers https://tools.silversneakers.com  -Walk with a Doc: http://www.duncan-williams.com/  -Check out the [facility] Active Adults 50+ section on the [place] [employer] (hiking clubs, book clubs, cards and games, chess, exercise classes, aquatic classes and much more) - see the website for details: https://www.[place].gov/departments/parks-recreation/active-adults50  -YouTube has lots of exercise videos for different  ages and abilities as well  -[facility] (a variety of indoor and outdoor inperson activities for adults). [phone]. [address]  -Virtual Online Classes (a variety of topics): see seniorplanet.org or call [phone]  -consider volunteering at a school, hospice center, church, senior center or elsewhere  FOR IMPROVED SLEEP AND TO RESET YOUR SLEEP SCHEDULE:  []  Schedule sleep  counseling(cognitive behavioral therapy).   [facility] is a good option.   Call for appointment: [phone]  []  Exercise 30 minutes daily. Some people do better with exercise in the morning, other do better exercising later in the day.  []  Avoid caffeine and alcohol - particularly in the evenings. For some people even a little alcohol can impair sleep.   []  Go to bed and wake up at the same time everyday (within a 30 minute window). When you get up in the morning for your designated wake time - turn on lights and open curtains right away. This will help to set your internal sleep clock.   []  Keep bedroom cool, dark and quiet - if you have to get up at night make sure there is no white light from street lamps, night lights, lights, clock, devices etc. that enters your eyes. Instead, use red light flashlight or night lights and avoid looking directly at the light while up.   []  Set 1-2 hour bedtime routine, dim lights, avoid screens (phones, computers, TVs, etc during this time), consider sleepytime tea, warm bath or shower, avoid alcohol and caffeine  []  Reserve bed for sleep - do not read, watch TV, look at phone or device, etc., in bed.  []  If you toss and turn for more then 10-15 minutes, get out of bed and list or journal thoughts or do quiet activity (not screen-time, no tv, phone, computer) then go back to bed. Repeat as needed. Try not to worry about when you will eventually fall asleep.  []  Some people find that a half dose of benadryl, melatonin, tylenol pm or  unisom on a few nights per week is helpful initially for a few weeks.  []  Seek help for any depression or anxiety.  [] Prescription strength sleep medications should only be used in severe cases of insomnia if other measures fail and should be used sparingly.  I hope you are feeling better soon! Follow up with your doctor in 3-4 weeks or sooner if your symptoms worsen or new concerns arise.           Chiquita JONELLE Cramp, DO

## 2024-08-15 ENCOUNTER — Other Ambulatory Visit: Payer: Self-pay

## 2024-08-15 ENCOUNTER — Ambulatory Visit (INDEPENDENT_AMBULATORY_CARE_PROVIDER_SITE_OTHER): Admitting: Orthopedic Surgery

## 2024-08-15 DIAGNOSIS — M25561 Pain in right knee: Secondary | ICD-10-CM | POA: Diagnosis not present

## 2024-08-15 DIAGNOSIS — G8929 Other chronic pain: Secondary | ICD-10-CM | POA: Diagnosis not present

## 2024-08-16 ENCOUNTER — Encounter: Payer: Self-pay | Admitting: Orthopedic Surgery

## 2024-08-16 DIAGNOSIS — M25561 Pain in right knee: Secondary | ICD-10-CM | POA: Diagnosis not present

## 2024-08-16 DIAGNOSIS — G8929 Other chronic pain: Secondary | ICD-10-CM | POA: Diagnosis not present

## 2024-08-16 MED ORDER — METHYLPREDNISOLONE ACETATE 40 MG/ML IJ SUSP
40.0000 mg | INTRAMUSCULAR | Status: AC | PRN
  Administered 2024-08-16: 40 mg via INTRA_ARTICULAR

## 2024-08-16 MED ORDER — LIDOCAINE HCL (PF) 1 % IJ SOLN
5.0000 mL | INTRAMUSCULAR | Status: AC | PRN
  Administered 2024-08-16: 5 mL

## 2024-08-16 NOTE — Progress Notes (Signed)
 Office Visit Note   Patient: Jon Selk Jr.           Date of Birth: November 01, 1941           MRN: [id number] Visit Date: 08/15/2024              Requested by: Theophilus Andrews, Tully GRADE, MD [address] [place],  [place] [address] PCP: Theophilus Andrews, Tully GRADE, MD  Chief Complaint  Patient presents with   Right Knee - Pain    Requesting cortisone injection      HPI: Discussed the use of AI scribe software for clinical note transcription with the patient, who gave verbal consent to proceed.  History of Present Illness Jon Bevis Jr. is an 83 year old male who presents with right knee pain.  He experiences intermittent right knee pain, described as 'off and on', located on the inside of the joint and associated with arthritis. The pain was particularly troublesome three days ago during travel, causing difficulty. Currently, he does not have pain.  He has a history of receiving an injection in the right knee in 2020, which provided relief for approximately five years. Recently, the pain has reappeared, prompting him to seek further treatment. He has been using Tylenol intermittently to manage the pain, which provides temporary relief for a couple of days, but he is reluctant to continue frequent use of Tylenol.     Assessment & Plan: Visit Diagnoses:  1. Chronic pain of right knee     Plan: Assessment and Plan Assessment & Plan Right knee osteoarthritis Chronic osteoarthritis with varus alignment and bone-on-bone contact at the medial joint line. Mild subcondylar sclerosis and cysts noted on radiographs. Symptoms intermittent, currently not significant. - Administered Depo-Medrol  and lidocaine  injection via anteromedial portal. - Advised follow-up as needed.      Follow-Up Instructions: No follow-ups on file.   Ortho Exam  Patient is alert, oriented, no adenopathy, well-dressed, normal affect, normal respiratory effort. Physical  Exam MUSCULOSKELETAL: Varus alignment of right knee with ambulation. Tenderness over medial joint line of right knee. No effusion, redness, or cellulitis in right knee. Crepitation with range of motion in right knee. Collateral and cruciate ligaments stable in right knee.      Imaging: No results found. No images are attached to the encounter.  Labs: Lab Results  Component Value Date   HGBA1C 6.2 (H) 09/01/2023   HGBA1C 6.3 (H) 05/28/2023   HGBA1C 6.0 (H) 12/08/2022     Lab Results  Component Value Date   ALBUMIN 4.1 06/24/2024   ALBUMIN 4.1 06/23/2024   ALBUMIN 3.6 11/02/2023    Lab Results  Component Value Date   MG 2.4 12/08/2022   MG 2.3 02/18/2022   MG 2.4 08/30/2021   Lab Results  Component Value Date   VD25OH 92 05/28/2023   VD25OH 113 (H) 12/08/2022   VD25OH 97 02/18/2022    No results found for: PREALBUMIN    Latest Ref Rng & Units 06/24/2024    1:58 PM 06/23/2024    3:48 AM 11/05/2023    2:59 PM  CBC EXTENDED  WBC 4.0 - 10.5 K/uL 6.1  7.7  5.9   RBC 4.22 - 5.81 Mil/uL 4.61  4.61  3.85   Hemoglobin 13.0 - 17.0 g/dL 86.3  86.2  88.6   HCT 39.0 - 52.0 % 41.7  41.7  34.7   Platelets 150.0 - 400.0 K/uL 188.0  194  186   NEUT# 1.4 - 7.7 K/uL  3.3   3,044   Lymph# 0.7 - 4.0 K/uL 1.4        There is no height or weight on file to calculate BMI.  Orders:  Orders Placed This Encounter  Procedures   XR Knee 1-2 Views Right   No orders of the defined types were placed in this encounter.    Procedures: Large Joint Inj: R knee on 08/16/2024 6:47 PM Indications: pain and diagnostic evaluation Details: 22 G 1.5 in needle, anteromedial approach  Arthrogram: No  Medications: 5 mL lidocaine  (PF) 1 %; 40 mg methylPREDNISolone  acetate 40 MG/ML Outcome: tolerated well, no immediate complications Procedure, treatment alternatives, risks and benefits explained, specific risks discussed. Consent was given by the patient. Immediately prior to procedure a time  out was called to verify the correct patient, procedure, equipment, support staff and site/side marked as required. Patient was prepped and draped in the usual sterile fashion.      Clinical Data: No additional findings.  ROS:  All other systems negative, except as noted in the HPI. Review of Systems  Objective: Vital Signs: There were no vitals taken for this visit.  Specialty Comments:  No specialty comments available.  PMFS History: Patient Active Problem List   Diagnosis Date Noted   Overweight (BMI 25.0-29.9) 04/22/2018   Osteoarthritis of knees, bilateral 06/24/2017   Medication management 02/16/2014   Hyperlipidemia    Hypertension    Vitamin D  deficiency    Prediabetes    COPD with asthma (HCC)    GERD (gastroesophageal reflux disease)    History of colon cancer    BPH (benign prostatic hyperplasia)    Past Medical History:  Diagnosis Date   Allergy    Asthma    BPH (benign prostatic hyperplasia)    GERD (gastroesophageal reflux disease)    History of colon cancer    Hyperlipidemia    Hypertension    Prediabetes    Vitamin D  deficiency     Family History  Problem Relation Age of Onset   Heart disease Mother    Cancer Father    Cancer Sister    Breast cancer Sister    Autoimmune disease Brother    Crohn's disease Daughter    Colon cancer Cousin        Second cousin on dad side   Esophageal cancer Neg Hx    Rectal cancer Neg Hx    Stomach cancer Neg Hx     Past Surgical History:  Procedure Laterality Date   COLON SURGERY  2004   Dr Nicholaus   Social History   Occupational History   Not on file  Tobacco Use   Smoking status: Never   Smokeless tobacco: Never  Vaping Use   Vaping status: Never Used  Substance and Sexual Activity   Alcohol use: Yes    Alcohol/week: 7.0 standard drinks of alcohol    Types: 7 Glasses of wine per week   Drug use: No   Sexual activity: Not on file

## 2024-09-01 ENCOUNTER — Ambulatory Visit: Admitting: Gastroenterology

## 2024-09-01 ENCOUNTER — Encounter: Payer: Self-pay | Admitting: Gastroenterology

## 2024-09-01 VITALS — BP 132/82 | HR 85 | Ht 68.0 in | Wt 175.0 lb

## 2024-09-01 DIAGNOSIS — Z85038 Personal history of other malignant neoplasm of large intestine: Secondary | ICD-10-CM

## 2024-09-01 DIAGNOSIS — K921 Melena: Secondary | ICD-10-CM

## 2024-09-01 DIAGNOSIS — K640 First degree hemorrhoids: Secondary | ICD-10-CM

## 2024-09-01 DIAGNOSIS — Z860100 Personal history of colon polyps, unspecified: Secondary | ICD-10-CM

## 2024-09-01 DIAGNOSIS — K641 Second degree hemorrhoids: Secondary | ICD-10-CM | POA: Diagnosis not present

## 2024-09-01 NOTE — Progress Notes (Signed)
 Chief Complaint:    Symptomatic Internal Hemorrhoids; Hemorrhoid Band Ligation  GI History: 83 y.o. male retired [employer] with a history of HTN, hyperlipidemia, BPH, asthma, and history of colon cancer as below.   Previously followed with Dr. Luis.  History of colon cancer s/p resection, chemotherapy, and radiation 2004.  Limited prior available records as below:   -04/04/2003: Partial colectomy and appendectomy with path demonstrating moderately differentiated rectal adenocarcinoma with deep invasion of muscularis propria and metastasis of 1/8 perirectal lymph nodes - 03/17/2011: Colonoscopy (Dr. Luis): No report available for review, but pathology report reviewed (mucosal prolapse polyp) and recommended repeat in 5 years  - 03/17/2016: Colonoscopy (Dr. Luis): Pandiverticulosis, internal hemorrhoids.  Repeat in 5 years   - 06/12/2022: Initial appointment in the [facility] to transfer care.  No GI symptoms. - 07/04/2022: Colonoscopy: 4 mm cecal polyp (path benign), 5 mm descending colon adenoma, pandiverticulosis, healthy-appearing anastomosis in the rectosigmoid, small internal hemorrhoids.  Normal TI.  - 11/17/2023: Banding of RP hemorrhoid - 01/01/2024: Banding of LL hemorrhoid  HPI:     Patient is a 83 y.o. malewith a history of symptomatic internal hemorrhoids presenting to the [facility]astroenterology Clinic for follow-up and ongoing treatment. The patient presents with symptomatic grade 2 hemorrhoids, unresponsive to maximal medical therapy, requesting repeat rubber band ligation of symptomatic hemorrhoidal disease.  Had done well with hemorrhoid banding earlier this year with resolution of symptoms.  Had recurrence of BRBPR in August prompting him to go to the ER.  No anemia and was otherwise HD stable and discharged home.  Was seen in GI clinic by Alan Coombs, PA-C on 06/24/2024 and exam notable for inflamed RP hemorrhoid on anoscopy.  Treated with hydrocortisone  suppository, sitz bath's,  and scheduled for hemorrhoid banding.  He reports recurrence of hemorrhoidal symptoms coincides with dietary indiscretions when visiting with his grandchildren and subsequent constipation with straining to have BM.  Return to normal bowel habits after getting back on his regular hide fiber diet.   Review of systems:     No chest pain, no SOB, no fevers, no urinary sx   Past Medical History:  Diagnosis Date   Allergy    Asthma    BPH (benign prostatic hyperplasia)    GERD (gastroesophageal reflux disease)    History of colon cancer    Hyperlipidemia    Hypertension    Prediabetes    Vitamin D  deficiency     Patient's surgical history, family medical history, social history, medications and allergies were all reviewed in Epic    Current Outpatient Medications  Medication Sig Dispense Refill   aspirin EC 81 MG tablet Take 81 mg by mouth daily. Swallow whole.     cetirizine  (ZYRTEC ) 10 MG tablet TAKE 1 TABLET DAILY FOR ALLERGIES                                                             /  TAKE                                         BY                                                 MOUTH 90 tablet 3   diltiazem  (TIAZAC ) 240 MG 24 hr capsule TAKE 1 CAPSULE EVERY MORNING FOR BLOOD PRESSURE 90 capsule 3   fluticasone -salmeterol (WIXELA INHUB) 250-50 MCG/ACT AEPB Inhale 1 puff into the lungs in the morning and at bedtime. 180 each 0   Folic Acid-Cholecalciferol (CHOLECAL DF PO) Take 2,000 Units by mouth daily.     hydrocortisone  (ANUSOL -HC) 25 MG suppository Place 1 suppository (25 mg total) rectally 2 (two) times daily. 12 suppository 0   Ipratropium-Albuterol  (COMBIVENT  RESPIMAT) 20-100 MCG/ACT AERS respimat Inhale 1 puff into the lungs every 4 (four) hours as needed for wheezing. 4 g 4   montelukast  (SINGULAIR ) 10 MG tablet TAKE 1 TABLET DAILY FOR ALLERGY AND ASTHMA 90 tablet 3   Multiple Vitamin (MULTIVITAMIN PO) Take by  mouth daily.     Omega-3 Fatty Acids (OMEGA-3 FISH OIL PO) Take by mouth.     rosuvastatin  (CRESTOR ) 40 MG tablet Take 1 tablet Daily for Cholesterol 90 tablet 3   valsartan  (DIOVAN ) 320 MG tablet Take 1 tablet (320 mg total) by mouth daily. 90 tablet 3   No current facility-administered medications for this visit.    Physical Exam:     BP 132/82 (BP Location: Left Arm, Patient Position: Sitting, Cuff Size: Normal)   Pulse 85   Ht 5' 8 (1.727 m)   Wt 175 lb (79.4 kg)   BMI 26.61 kg/m   GENERAL:  Pleasant male in NAD PSYCH: : Cooperative, normal affect NEURO: Alert and oriented x 3, no focal neurologic deficits Rectal exam: Sensation intact and preserved anal wink.  Grade 2 hemorrhoid in LL position and grade 1 hemorrhoid in RP position on anoscopy.  No external anal fissures noted. Normal sphincter tone. No palpable mass. No blood on the exam glove. (Chaperone: Nat Sic, CMA).   IMPRESSION and PLAN:    #1.  Symptomatic internal hemorrhoids: PROCEDURE NOTE: The patient presents with symptomatic grade 1-2 hemorrhoids, unresponsive to maximal medical therapy, requesting rubber band ligation of symptomatic hemorrhoidal disease.  All risks, benefits and alternative forms of therapy were described and informed consent was obtained.  In the Left Lateral Decubitus position, anoscopic examination revealed grade 1 hemorrhoid in RP position and grade 2 hemorrhoid in LL position on anoscopy.   The anorectum was pre-medicated with RectiCare. The decision was made to band the RP and LL internal hemorrhoids, and the [facility] System was used to perform band ligation without complication.  Digital anorectal examination was then performed to assure proper positioning of the band, and to adjust the banded tissue as required.  The patient was discharged home without pain or other issues.  Dietary and behavioral recommendations were given and along with follow-up instructions.     The  following adjunctive treatments were recommended:  -Resume high-fiber diet with fiber supplement (i.e. Citrucel or Benefiber) with goal for soft stools without straining to have a BM. -Resume adequate fluid intake.  The patient will return in 4+  weeks for follow-up and possible additional banding as required. No complications were encountered and the patient tolerated the procedure well.     #2.  History of colon cancer #3. History of colon polyps History of colon cancer s/p resection, chemotherapy, and radiation 2004.  Last colonoscopy in 2023 with a single subcentimeter adenoma, otherwise healthy appearing anastomosis and no evidence of recurrence.  Previously discussed role/utility of repeat colonoscopy after 5 years for ongoing surveillance.  He would be age 68 at that point, so this would certainly be done with strong consideration of overall health and patient wishes rather than scheduling directly for procedure.   RTC as needed      Sandor LULLA Flatter ,DO, FACG 09/01/2024, 11:30 AM

## 2024-09-01 NOTE — Patient Instructions (Signed)
 _______________________________________________________  If your blood pressure at your visit was 140/90 or greater, please contact your primary care physician to follow up on this.  _______________________________________________________  If you are age 83 or older, your body mass index should be between 23-30. Your Body mass index is 26.61 kg/m. If this is out of the aforementioned range listed, please consider follow up with your Primary Care Provider.  If you are age 72 or younger, your body mass index should be between 19-25. Your Body mass index is 26.61 kg/m. If this is out of the aformentioned range listed, please consider follow up with your Primary Care Provider.   ________________________________________________________  The [facility] providers would like to encourage you to use MYCHART to communicate with providers for non-urgent requests or questions.  Due to long hold times on the telephone, sending your provider a message by [facility] may be a faster and more efficient way to get a response.  Please allow 48 business hours for a response.  Please remember that this is for non-urgent requests.  _______________________________________________________  [facility] is using a team-based approach to care.  Your team is made up of your doctor and two to three APPS. Our APPS (Nurse Practitioners and Physician Assistants) work with your physician to ensure care continuity for you. They are fully qualified to address your health concerns and develop a treatment plan. They communicate directly with your gastroenterologist to care for you. Seeing the Advanced Practice Practitioners on your physician's team can help you by facilitating care more promptly, often allowing for earlier appointments, access to diagnostic testing, procedures, and other specialty referrals.   It was a pleasure to see you today!  Vito Cirigliano, D.O.

## 2024-09-05 ENCOUNTER — Encounter: Payer: Self-pay | Admitting: Radiology

## 2024-09-13 ENCOUNTER — Ambulatory Visit: Admitting: Internal Medicine

## 2024-09-13 ENCOUNTER — Encounter: Payer: Self-pay | Admitting: Internal Medicine

## 2024-09-13 VITALS — BP 120/80 | HR 76 | Temp 97.5°F | Ht 66.5 in | Wt 173.6 lb

## 2024-09-13 DIAGNOSIS — E559 Vitamin D deficiency, unspecified: Secondary | ICD-10-CM

## 2024-09-13 DIAGNOSIS — E782 Mixed hyperlipidemia: Secondary | ICD-10-CM | POA: Diagnosis not present

## 2024-09-13 DIAGNOSIS — I10 Essential (primary) hypertension: Secondary | ICD-10-CM | POA: Diagnosis not present

## 2024-09-13 DIAGNOSIS — R7303 Prediabetes: Secondary | ICD-10-CM

## 2024-09-13 DIAGNOSIS — Z0000 Encounter for general adult medical examination without abnormal findings: Secondary | ICD-10-CM

## 2024-09-13 LAB — COMPREHENSIVE METABOLIC PANEL WITH GFR
ALT: 26 U/L (ref 0–53)
AST: 23 U/L (ref 0–37)
Albumin: 4.2 g/dL (ref 3.5–5.2)
Alkaline Phosphatase: 85 U/L (ref 39–117)
BUN: 17 mg/dL (ref 6–23)
CO2: 29 meq/L (ref 19–32)
Calcium: 8.7 mg/dL (ref 8.4–10.5)
Chloride: 101 meq/L (ref 96–112)
Creatinine, Ser: 1.09 mg/dL (ref 0.40–1.50)
GFR: 62.82 mL/min (ref >60.00)
Glucose, Bld: 97 mg/dL (ref 70–99)
Potassium: 3.8 meq/L (ref 3.5–5.1)
Sodium: 138 meq/L (ref 135–145)
Total Bilirubin: 0.5 mg/dL (ref 0.2–1.2)
Total Protein: 7.3 g/dL (ref 6.0–8.3)

## 2024-09-13 LAB — CBC WITH DIFFERENTIAL/PLATELET
Basophils Absolute: 0.1 K/uL (ref 0.0–0.1)
Basophils Relative: 2.4 % (ref 0.0–3.0)
Eosinophils Absolute: 0.4 K/uL (ref 0.0–0.7)
Eosinophils Relative: 8.1 % — ABNORMAL HIGH (ref 0.0–5.0)
HCT: 41.7 % (ref 39.0–52.0)
Hemoglobin: 14.1 g/dL (ref 13.0–17.0)
Lymphocytes Relative: 22.9 % (ref 12.0–46.0)
Lymphs Abs: 1.2 K/uL (ref 0.7–4.0)
MCHC: 33.8 g/dL (ref 30.0–36.0)
MCV: 88.9 fl (ref 78.0–100.0)
Monocytes Absolute: 0.5 K/uL (ref 0.1–1.0)
Monocytes Relative: 9.1 % (ref 3.0–12.0)
Neutro Abs: 3 K/uL (ref 1.4–7.7)
Neutrophils Relative %: 57.5 % (ref 43.0–77.0)
Platelets: 170 K/uL (ref 150.0–400.0)
RBC: 4.69 Mil/uL (ref 4.22–5.81)
RDW: 14.2 % (ref 11.5–15.5)
WBC: 5.1 K/uL (ref 4.0–10.5)

## 2024-09-13 LAB — PSA: PSA: 0.47 ng/mL (ref 0.10–4.00)

## 2024-09-13 LAB — LIPID PANEL
Cholesterol: 153 mg/dL (ref 0–200)
HDL: 62.1 mg/dL (ref >39.00)
LDL Cholesterol: 71 mg/dL (ref 0–99)
NonHDL: 91.35
Total CHOL/HDL Ratio: 2
Triglycerides: 101 mg/dL (ref 0.0–149.0)
VLDL: 20.2 mg/dL (ref 0.0–40.0)

## 2024-09-13 LAB — HEMOGLOBIN A1C: Hgb A1c MFr Bld: 6.2 % (ref 4.6–6.5)

## 2024-09-13 LAB — VITAMIN D 25 HYDROXY (VIT D DEFICIENCY, FRACTURES): VITD: 70.65 ng/mL (ref 30.00–100.00)

## 2024-09-13 NOTE — Progress Notes (Signed)
 Established Patient Office Visit     CC/Reason for Visit: Annual preventive exam  HPI: Jon Lanagan Jr. is a 83 y.o. male who is coming in today for the above mentioned reasons. Past Medical History is significant for: Hypertension, hyperlipidemia, impaired glucose tolerance, vitamin D  deficiency, COPD with asthma, history of colon cancer.  Feeling well, no concerns or complaints.  Would like his handicap placard filled he has routine eye and dental care.  All immunizations are up-to-date with the exception of shingles.  Cancer screening is up-to-date.   Past Medical/Surgical History: Past Medical History:  Diagnosis Date   Allergy    Asthma    BPH (benign prostatic hyperplasia)    GERD (gastroesophageal reflux disease)    History of colon cancer    Hyperlipidemia    Hypertension    Prediabetes    Vitamin D  deficiency     Past Surgical History:  Procedure Laterality Date   COLON SURGERY  2004   Dr Nicholaus    Social History:  reports that he has never smoked. He has never used smokeless tobacco. He reports current alcohol use of about 7.0 standard drinks of alcohol per week. He reports that he does not use drugs.  Allergies: Allergies  Allergen Reactions   Mavik [Trandolapril]     Cough   Hctz [Hydrochlorothiazide] Rash    Photosensitivity    Family History:  Family History  Problem Relation Age of Onset   Heart disease Mother    Cancer Father    Cancer Sister    Breast cancer Sister    Autoimmune disease Brother    Crohn's disease Daughter    Colon cancer Cousin        Second cousin on dad side   Esophageal cancer Neg Hx    Rectal cancer Neg Hx    Stomach cancer Neg Hx      Current Outpatient Medications:    aspirin EC 81 MG tablet, Take 81 mg by mouth daily. Swallow whole., Disp: , Rfl:    cetirizine  (ZYRTEC ) 10 MG tablet, TAKE 1 TABLET DAILY FOR ALLERGIES                                                             /                                                                    TAKE                                         BY                                                 MOUTH, Disp: 90 tablet, Rfl: 3   diltiazem  (TIAZAC ) 240 MG 24 hr capsule, TAKE 1 CAPSULE EVERY MORNING  FOR BLOOD PRESSURE, Disp: 90 capsule, Rfl: 3   fluticasone -salmeterol (WIXELA INHUB) 250-50 MCG/ACT AEPB, Inhale 1 puff into the lungs in the morning and at bedtime., Disp: 180 each, Rfl: 0   Folic Acid-Cholecalciferol (CHOLECAL DF PO), Take 2,000 Units by mouth daily., Disp: , Rfl:    Ipratropium-Albuterol  (COMBIVENT  RESPIMAT) 20-100 MCG/ACT AERS respimat, Inhale 1 puff into the lungs every 4 (four) hours as needed for wheezing., Disp: 4 g, Rfl: 4   montelukast  (SINGULAIR ) 10 MG tablet, TAKE 1 TABLET DAILY FOR ALLERGY AND ASTHMA, Disp: 90 tablet, Rfl: 3   Multiple Vitamin (MULTIVITAMIN PO), Take by mouth daily., Disp: , Rfl:    Omega-3 Fatty Acids (OMEGA-3 FISH OIL PO), Take by mouth., Disp: , Rfl:    rosuvastatin  (CRESTOR ) 40 MG tablet, Take 1 tablet Daily for Cholesterol, Disp: 90 tablet, Rfl: 3   valsartan  (DIOVAN ) 320 MG tablet, Take 1 tablet (320 mg total) by mouth daily., Disp: 90 tablet, Rfl: 3  Review of Systems:  Negative unless indicated in HPI.   Physical Exam: Vitals:   09/13/24 0755  BP: 120/80  Pulse: 76  Temp: (!) 97.5 F (36.4 C)  TempSrc: Oral  SpO2: 98%  Weight: 173 lb 9.6 oz (78.7 kg)  Height: 5' 6.5 (1.689 m)    Body mass index is 27.6 kg/m.   Physical Exam Vitals reviewed.  Constitutional:      General: He is not in acute distress.    Appearance: Normal appearance. He is not ill-appearing, toxic-appearing or diaphoretic.  HENT:     Head: Normocephalic.     Right Ear: Tympanic membrane, ear canal and external ear normal. There is no impacted cerumen.     Left Ear: Tympanic membrane, ear canal and external ear normal. There is no impacted cerumen.     Nose: Nose normal.     Mouth/Throat:     Mouth: Mucous membranes are  moist.     Pharynx: Oropharynx is clear. No oropharyngeal exudate or posterior oropharyngeal erythema.  Eyes:     General: No scleral icterus.       Right eye: No discharge.        Left eye: No discharge.     Conjunctiva/sclera: Conjunctivae normal.     Pupils: Pupils are equal, round, and reactive to light.  Neck:     Vascular: No carotid bruit.  Cardiovascular:     Rate and Rhythm: Normal rate and regular rhythm.     Pulses: Normal pulses.     Heart sounds: Normal heart sounds.  Pulmonary:     Effort: Pulmonary effort is normal. No respiratory distress.     Breath sounds: Normal breath sounds.  Abdominal:     General: Abdomen is flat. Bowel sounds are normal.     Palpations: Abdomen is soft.  Musculoskeletal:        General: Normal range of motion.     Cervical back: Normal range of motion.  Skin:    General: Skin is warm and dry.  Neurological:     General: No focal deficit present.     Mental Status: He is alert and oriented to person, place, and time. Mental status is at baseline.  Psychiatric:        Mood and Affect: Mood normal.        Behavior: Behavior normal.        Thought Content: Thought content normal.        Judgment: Judgment normal.      Impression and  Plan:  Encounter for preventive health examination -     PSA; Future  Mixed hyperlipidemia -     Lipid panel; Future  Primary hypertension -     CBC with Differential/Platelet; Future -     Comprehensive metabolic panel with GFR; Future  Prediabetes -     Hemoglobin A1c; Future  Vitamin D  deficiency -     VITAMIN D  25 Hydroxy (Vit-D Deficiency, Fractures); Future  Essential hypertension   -Recommend routine eye and dental care. -Healthy lifestyle discussed in detail. -Labs to be updated today. -Prostate cancer screening: PSA today Health Maintenance  Topic Date Due   Zoster (Shingles) Vaccine (1 of 2) Never done   COVID-19 Vaccine (8 - Pfizer risk 2025-26 season) 03/05/2025   Medicare  Annual Wellness Visit  07/21/2025   Colon Cancer Screening  07/05/2027   DTaP/Tdap/Td vaccine (3 - Td or Tdap) 06/03/2030   Pneumococcal Vaccine for age over 28  Completed   Flu Shot  Completed   Meningitis B Vaccine  Aged Out     -Advised to update shingles vaccine at pharmacy.     Tully Theophilus Andrews, MD [facility]

## 2024-09-14 ENCOUNTER — Ambulatory Visit: Payer: Self-pay | Admitting: Internal Medicine

## 2024-09-19 ENCOUNTER — Encounter: Admitting: Internal Medicine

## 2024-09-19 ENCOUNTER — Encounter: Payer: Self-pay | Admitting: Internal Medicine

## 2024-09-20 NOTE — Progress Notes (Signed)
 This encounter was created in error - please disregard.

## 2024-10-19 ENCOUNTER — Ambulatory Visit: Payer: Self-pay

## 2024-10-19 NOTE — Telephone Encounter (Signed)
 FYI Only or Action Required?: FYI only for provider: appointment scheduled on 10/20/2024.  Patient was last seen in primary care on 09/13/2024 by Theophilus Andrews, Tully GRADE, MD.  Called Nurse Triage reporting Otalgia.  Symptoms began x 5 days.  Interventions attempted: OTC medications: tylenol.  Symptoms are: gradually worsening.  Triage Disposition: See Physician Within 24 Hours  Patient/caregiver understands and will follow disposition?: Yes   Copied from CRM #[id number]. Topic: Clinical - Red Word Triage >> Oct 19, 2024  8:50 AM Eva FALCON wrote: Red Word that prompted transfer to Nurse Triage: ear pain past 5 days has been taking tylenol every 6 hours and it helps for a bit, still feels discomfort. When he doesn't take tylenol pain is pretty severe. Thinks he has taken enough tylenol and is requesting antibiotics thinks he has an ear infection. Reason for Disposition  White, yellow, or green discharge (pus)  Answer Assessment - Initial Assessment Questions 1. LOCATION: Which ear is involved?     Left ear 2. ONSET: When did the ear pain start?      X 5 days 3. SEVERITY: How bad is the pain?  (Scale 1-10; mild, moderate or severe)     Mild moderate 4. URI SYMPTOMS: Do you have a runny nose or cough?     na 5. FEVER: Do you have a fever? If Yes, ask: What is your temperature, how was it measured, and when did it start?     Yes  6. CAUSE: Have you been swimming recently?, How often do you use Q-TIPS?, Have you had any recent air travel or scuba diving?     na 7. OTHER SYMPTOMS: Do you have any other symptoms? (e.g., decreased hearing, dizziness, headache, stiff neck, vomiting)     no 8. PREGNANCY: Is there any chance you are pregnant? When was your last menstrual period?     na  Protocols used: [place]

## 2024-10-20 ENCOUNTER — Encounter: Payer: Self-pay | Admitting: Internal Medicine

## 2024-10-20 ENCOUNTER — Ambulatory Visit: Admitting: Internal Medicine

## 2024-10-20 VITALS — BP 173/96 | HR 88 | Temp 98.1°F | Wt 179.3 lb

## 2024-10-20 DIAGNOSIS — H9202 Otalgia, left ear: Secondary | ICD-10-CM | POA: Diagnosis not present

## 2024-10-20 NOTE — Progress Notes (Signed)
 Established Patient Office Visit     CC/Reason for Visit: Left ear pain  HPI: Jon Mysliwiec Jr. is a 83 y.o. male who is coming in today for the above mentioned reasons.  Has been present for about a week.  He has also been having URI symptoms with congestion, runny nose and postnasal drip.   Past Medical/Surgical History: Past Medical History:  Diagnosis Date   Allergy    Asthma    BPH (benign prostatic hyperplasia)    GERD (gastroesophageal reflux disease)    History of colon cancer    Hyperlipidemia    Hypertension    Prediabetes    Vitamin D  deficiency     Past Surgical History:  Procedure Laterality Date   COLON SURGERY  2004   Dr Nicholaus    Social History:  reports that he has never smoked. He has never used smokeless tobacco. He reports current alcohol use of about 7.0 standard drinks of alcohol per week. He reports that he does not use drugs.  Allergies: Allergies[1]  Family History:  Family History  Problem Relation Age of Onset   Heart disease Mother    Cancer Father    Cancer Sister    Breast cancer Sister    Autoimmune disease Brother    Crohn's disease Daughter    Colon cancer Cousin        Second cousin on dad side   Esophageal cancer Neg Hx    Rectal cancer Neg Hx    Stomach cancer Neg Hx     Current Medications[2]  Review of Systems:  Negative unless indicated in HPI.   Physical Exam: Vitals:   10/20/24 1426 10/20/24 1431  BP: (!) 153/93 (!) 173/96  Pulse: 88   Temp: 98.1 F (36.7 C)   TempSrc: Oral   SpO2: 96%   Weight: 179 lb 4.8 oz (81.3 kg)     Body mass index is 28.51 kg/m.   Physical Exam HENT:     Right Ear: A middle ear effusion is present.     Left Ear: A middle ear effusion is present.      Impression and Plan:  Left ear pain   - No signs of infection, suspect related to recent URI and congestion.  Have advised a combination of guaifenesin, antihistamine and Flonase .  Time spent:22 minutes  reviewing chart, interviewing and examining patient and formulating plan of care.     Tully Theophilus Andrews, MD [facility]     [1]  Allergies Allergen Reactions   Mavik [Trandolapril]     Cough   Hctz [Hydrochlorothiazide] Rash    Photosensitivity  [2]  Current Outpatient Medications:    aspirin EC 81 MG tablet, Take 81 mg by mouth daily. Swallow whole., Disp: , Rfl:    cetirizine  (ZYRTEC ) 10 MG tablet, TAKE 1 TABLET DAILY FOR ALLERGIES                                                             /  TAKE                                         BY                                                 MOUTH, Disp: 90 tablet, Rfl: 3   diltiazem  (TIAZAC ) 240 MG 24 hr capsule, TAKE 1 CAPSULE EVERY MORNING FOR BLOOD PRESSURE, Disp: 90 capsule, Rfl: 3   fluticasone -salmeterol (WIXELA INHUB) 250-50 MCG/ACT AEPB, Inhale 1 puff into the lungs in the morning and at bedtime., Disp: 180 each, Rfl: 0   Folic Acid-Cholecalciferol (CHOLECAL DF PO), Take 2,000 Units by mouth daily., Disp: , Rfl:    Ipratropium-Albuterol  (COMBIVENT  RESPIMAT) 20-100 MCG/ACT AERS respimat, Inhale 1 puff into the lungs every 4 (four) hours as needed for wheezing., Disp: 4 g, Rfl: 4   montelukast  (SINGULAIR ) 10 MG tablet, TAKE 1 TABLET DAILY FOR ALLERGY AND ASTHMA, Disp: 90 tablet, Rfl: 3   Multiple Vitamin (MULTIVITAMIN PO), Take by mouth daily., Disp: , Rfl:    Omega-3 Fatty Acids (OMEGA-3 FISH OIL PO), Take by mouth., Disp: , Rfl:    rosuvastatin  (CRESTOR ) 40 MG tablet, Take 1 tablet Daily for Cholesterol, Disp: 90 tablet, Rfl: 3   valsartan  (DIOVAN ) 320 MG tablet, Take 1 tablet (320 mg total) by mouth daily., Disp: 90 tablet, Rfl: 3

## 2024-12-09 ENCOUNTER — Ambulatory Visit: Admitting: Adult Health

## 2024-12-09 ENCOUNTER — Encounter: Payer: Self-pay | Admitting: Adult Health

## 2024-12-09 ENCOUNTER — Ambulatory Visit: Payer: Self-pay

## 2024-12-09 VITALS — BP 128/80 | HR 77 | Temp 98.1°F | Ht 66.5 in | Wt 175.0 lb

## 2024-12-09 DIAGNOSIS — M62838 Other muscle spasm: Secondary | ICD-10-CM

## 2024-12-09 MED ORDER — METHOCARBAMOL 750 MG PO TABS: 750.0000 mg | ORAL_TABLET | Freq: Three times a day (TID) | ORAL | 0 refills | Status: AC | PRN

## 2024-12-09 NOTE — Progress Notes (Signed)
 "  Subjective:    Patient ID: Jon Nicholette Raddle., male    DOB: 03-12-1941, 84 y.o.   MRN: [id number]  HPI Discussed the use of AI scribe software for clinical note transcription with the patient, who gave verbal consent to proceed.  History of Present Illness   Jon Shreeve Jr. is an 84 year old male who presents with persistent left groin pain following physical activity.  He developed intermittent left groin pain after snow-related physical activity 2 days ago. He describes a brief shock treatment sensation that occurs mainly when sitting, is absent with walking, climbing stairs, or lying down, and is more frequent in the evening and sometimes during sleep. He denies bowel changes, reports regular bowel movements, and has no groin lumps or bumps. He has urinary urgency, especially after coffee, without dysuria ( [place]). PSA was normal 2 months ago. He started Tylenol 3 days ago for pain and has not tried NSAIDs such as Motrin or Aleve.        Review of Systems See HPI   Past Medical History:  Diagnosis Date   Allergy    Asthma    BPH (benign prostatic hyperplasia)    GERD (gastroesophageal reflux disease)    History of colon cancer    Hyperlipidemia    Hypertension    Prediabetes    Vitamin D  deficiency     Social History   Socioeconomic History   Marital status: Married    Spouse name: Not on file   Number of children: Not on file   Years of education: Not on file   Highest education level: 12th grade  Occupational History   Not on file  Tobacco Use   Smoking status: Never   Smokeless tobacco: Never  Vaping Use   Vaping status: Never Used  Substance and Sexual Activity   Alcohol use: Yes    Alcohol/week: 7.0 standard drinks of alcohol    Types: 7 Glasses of wine per week   Drug use: No   Sexual activity: Not on file  Other Topics Concern   Not on file  Social History Narrative   Not on file   Social Drivers of Health   Tobacco Use: Low Risk  (12/09/2024)   Patient History    Smoking Tobacco Use: Never    Smokeless Tobacco Use: Never    Passive Exposure: Not on file  Financial Resource Strain: Low Risk (07/21/2024)   Overall Financial Resource Strain (CARDIA)    Difficulty of Paying Living Expenses: Not hard at all  Food Insecurity: No Food Insecurity (07/21/2024)   Epic    Worried About Programme Researcher, Broadcasting/film/video in the Last Year: Never true    Ran Out of Food in the Last Year: Never true  Transportation Needs: No Transportation Needs (07/21/2024)   Epic    Lack of Transportation (Medical): No    Lack of Transportation (Non-Medical): No  Physical Activity: Insufficiently Active (07/21/2024)   Exercise Vital Sign    Days of Exercise per Week: 3 days    Minutes of Exercise per Session: 30 min  Stress: No Stress Concern Present (07/21/2024)   [employer] of Occupational Health - Occupational Stress Questionnaire    Feeling of Stress: Only a little  Social Connections: Unknown (07/21/2024)   Social Connection and Isolation Panel    Frequency of Communication with Friends and Family: Once a week    Frequency of Social Gatherings with Friends and Family: Patient declined    Attends Religious Services:  More than 4 times per year    Active Member of Clubs or Organizations: Yes    Attends Club or Organization Meetings: More than 4 times per year    Marital Status: Married  Catering Manager Violence: Not At Risk (03/09/2024)   Humiliation, Afraid, Rape, and Kick questionnaire    Fear of Current or Ex-Partner: No    Emotionally Abused: No    Physically Abused: No    Sexually Abused: No  Depression (PHQ2-9): Low Risk (07/21/2024)   Depression (PHQ2-9)    PHQ-2 Score: 0  Alcohol Screen: Low Risk (07/21/2024)   Alcohol Screen    Last Alcohol Screening Score (AUDIT): 1  Housing: Low Risk (03/09/2024)   Housing Stability Vital Sign    Unable to Pay for Housing in the Last Year: No    Number of Times Moved in the Last Year: 0    Homeless  in the Last Year: No  Utilities: Not At Risk (03/09/2024)   AHC Utilities    Threatened with loss of utilities: No  Health Literacy: Adequate Health Literacy (03/09/2024)   B1300 Health Literacy    Frequency of need for help with medical instructions: Never    Past Surgical History:  Procedure Laterality Date   COLON SURGERY  2004   Dr Nicholaus    Family History  Problem Relation Age of Onset   Heart disease Mother    Cancer Father    Cancer Sister    Breast cancer Sister    Autoimmune disease Brother    Crohn's disease Daughter    Colon cancer Cousin        Second cousin on dad side   Esophageal cancer Neg Hx    Rectal cancer Neg Hx    Stomach cancer Neg Hx     Allergies[1]  Medications Ordered Prior to Encounter[2]  BP 128/80   Pulse 77   Temp 98.1 F (36.7 C) (Oral)   Ht 5' 6.5 (1.689 m)   Wt 175 lb (79.4 kg)   SpO2 96%   BMI 27.82 kg/m       Objective:   Physical Exam Vitals and nursing note reviewed.  Constitutional:      Appearance: Normal appearance.  Cardiovascular:     Rate and Rhythm: Normal rate and regular rhythm.     Pulses: Normal pulses.     Heart sounds: Normal heart sounds.  Pulmonary:     Breath sounds: Normal breath sounds.  Abdominal:     General: Abdomen is flat. Bowel sounds are normal. There is no distension.     Palpations: Abdomen is soft.     Tenderness: There is no abdominal tenderness.     Hernia: No hernia is present.  Musculoskeletal:        General: Normal range of motion.     Left hip: No deformity, bony tenderness or crepitus. Normal range of motion. Normal strength.     Comments: Unable to reproduce pain in the office   Skin:    General: Skin is warm and dry.     Capillary Refill: Capillary refill takes less than 2 seconds.  Neurological:     General: No focal deficit present.     Mental Status: He is alert and oriented to person, place, and time.  Psychiatric:        Mood and Affect: Mood normal.        Behavior:  Behavior normal.        Thought Content: Thought content normal.  Judgment: Judgment normal.        Assessment & Plan:  Assessment and Plan    Left groin muscle spasm Intermittent left groin pain likely due to muscle strain or spasm. No hernia or prostate involvement. - Prescribed Robaxin  at night initially, then every eight hours as needed. - Recommended heating pad application. - Advised Tylenol or Motrin for pain relief       Darleene Shape, NP     [1]  Allergies Allergen Reactions   Mavik [Trandolapril]     Cough   Hctz [Hydrochlorothiazide] Rash    Photosensitivity  [2]  Current Outpatient Medications on File Prior to Visit  Medication Sig Dispense Refill   aspirin EC 81 MG tablet Take 81 mg by mouth daily. Swallow whole.     cetirizine  (ZYRTEC ) 10 MG tablet TAKE 1 TABLET DAILY FOR ALLERGIES                                                             /                                                                   TAKE                                         BY                                                 MOUTH 90 tablet 3   diltiazem  (TIAZAC ) 240 MG 24 hr capsule TAKE 1 CAPSULE EVERY MORNING FOR BLOOD PRESSURE 90 capsule 3   fluticasone -salmeterol (WIXELA INHUB) 250-50 MCG/ACT AEPB Inhale 1 puff into the lungs in the morning and at bedtime. 180 each 0   Folic Acid-Cholecalciferol (CHOLECAL DF PO) Take 2,000 Units by mouth daily.     Ipratropium-Albuterol  (COMBIVENT  RESPIMAT) 20-100 MCG/ACT AERS respimat Inhale 1 puff into the lungs every 4 (four) hours as needed for wheezing. 4 g 4   montelukast  (SINGULAIR ) 10 MG tablet TAKE 1 TABLET DAILY FOR ALLERGY AND ASTHMA 90 tablet 3   Multiple Vitamin (MULTIVITAMIN PO) Take by mouth daily.     Omega-3 Fatty Acids (OMEGA-3 FISH OIL PO) Take by mouth.     rosuvastatin  (CRESTOR ) 40 MG tablet Take 1 tablet Daily for Cholesterol 90 tablet 3   valsartan  (DIOVAN ) 320 MG tablet Take 1 tablet (320 mg total) by mouth daily. 90  tablet 3   No current facility-administered medications on file prior to visit.   "

## 2024-12-09 NOTE — Telephone Encounter (Signed)
 Appt scheduled today-2/6 with Darleene.

## 2024-12-09 NOTE — Telephone Encounter (Signed)
 FYI Only or Action Required?: FYI only for provider: appointment scheduled on 12/09/2024 at 10:30am with Darleene Shape NP .  Patient was last seen in primary care on 10/20/2024 by Theophilus Andrews, Tully GRADE, MD.  Called Nurse Triage reporting Groin Pain.  Symptoms began several days ago.  Interventions attempted: OTC medications: Tylenol two days ago and Rest, hydration, or home remedies.  Symptoms are: unchanged.  Triage Disposition: See Physician Within 24 Hours  Patient/caregiver understands and will follow disposition?: Yes    Message from [facility] sent at 12/09/2024  8:07 AM EST  Reason for Triage: Believe he has prostate cancer didn't show up on physical, having a tingling feeling and discomfort in that area   Reason for Disposition  [1] Pain comes and goes (intermittent) AND [2] present > 24 hours  Answer Assessment - Initial Assessment Questions Patient called and advised that he has some tingling/discomfort in a certain area Intermittent discomfort for three days --patient states he believes he is feeling this tingling between his testicles and left inner leg  Patient states he has some soreness in this area after shoveling snow over three days ago  Off and on pain -- patient states he isnt sure of what causes the discomfort to come on --- he states he didn't know if he pulled something shoveling snow but he also inquired about prostate cancer. Patient denies any fevers, chest pain, nausea, vomiting,  Patient also denies swelling, bruising, or redness in this area or in the testicles and denies any history of a hernia  He denies symptoms being present during triage Patient scheduled with a provider today Darleene Shape NP and patient states he will discuss this further when he sees the provider. Patient is advised to call us  back if anything changes or with any further questions/concerns. Patient is advised that if anything worsens to go to the Emergency Room. Patient  verbalized understanding.  Protocols used: Scrotal Pain-A-AH

## 2025-03-08 ENCOUNTER — Encounter: Admitting: Internal Medicine
